# Patient Record
Sex: Female | Born: 1945 | Race: White | Hispanic: No | Smoking: Never smoker
Health system: Southern US, Community
[De-identification: ages and names within clinical notes are randomized; demographics above are authoritative.]

## PROBLEM LIST (undated history)

## (undated) DIAGNOSIS — I10 Essential (primary) hypertension: Secondary | ICD-10-CM

## (undated) DIAGNOSIS — Z808 Family history of malignant neoplasm of other organs or systems: Secondary | ICD-10-CM

## (undated) DIAGNOSIS — Z800 Family history of malignant neoplasm of digestive organs: Secondary | ICD-10-CM

## (undated) DIAGNOSIS — R011 Cardiac murmur, unspecified: Secondary | ICD-10-CM

## (undated) DIAGNOSIS — Z801 Family history of malignant neoplasm of trachea, bronchus and lung: Secondary | ICD-10-CM

## (undated) DIAGNOSIS — K219 Gastro-esophageal reflux disease without esophagitis: Secondary | ICD-10-CM

## (undated) DIAGNOSIS — E669 Obesity, unspecified: Secondary | ICD-10-CM

## (undated) DIAGNOSIS — H269 Unspecified cataract: Secondary | ICD-10-CM

## (undated) DIAGNOSIS — Z8042 Family history of malignant neoplasm of prostate: Secondary | ICD-10-CM

## (undated) DIAGNOSIS — R0600 Dyspnea, unspecified: Secondary | ICD-10-CM

## (undated) DIAGNOSIS — N8502 Endometrial intraepithelial neoplasia [EIN]: Secondary | ICD-10-CM

## (undated) DIAGNOSIS — Z923 Personal history of irradiation: Secondary | ICD-10-CM

## (undated) HISTORY — PX: TONSILLECTOMY: SUR1361

## (undated) HISTORY — PX: WISDOM TOOTH EXTRACTION: SHX21

## (undated) HISTORY — DX: Family history of malignant neoplasm of other organs or systems: Z80.8

## (undated) HISTORY — DX: Cardiac murmur, unspecified: R01.1

## (undated) HISTORY — PX: TOTAL HIP ARTHROPLASTY: SHX124

## (undated) HISTORY — DX: Family history of malignant neoplasm of digestive organs: Z80.0

## (undated) HISTORY — DX: Family history of malignant neoplasm of prostate: Z80.42

## (undated) HISTORY — DX: Family history of malignant neoplasm of trachea, bronchus and lung: Z80.1

## (undated) HISTORY — DX: Endometrial intraepithelial neoplasia (EIN): N85.02

## (undated) HISTORY — DX: Unspecified cataract: H26.9

## (undated) HISTORY — DX: Gastro-esophageal reflux disease without esophagitis: K21.9

## (undated) HISTORY — PX: ESOPHAGOGASTRODUODENOSCOPY: SHX1529

## (undated) HISTORY — DX: Obesity, unspecified: E66.9

## (undated) HISTORY — PX: BIOPSY ENDOMETRIAL: PRO11

---

## 1999-12-11 ENCOUNTER — Other Ambulatory Visit: Admission: RE | Admit: 1999-12-11 | Discharge: 1999-12-11 | Payer: Self-pay | Admitting: *Deleted

## 1999-12-25 ENCOUNTER — Ambulatory Visit (HOSPITAL_COMMUNITY): Admission: RE | Admit: 1999-12-25 | Discharge: 1999-12-25 | Payer: Self-pay | Admitting: General Practice

## 1999-12-25 ENCOUNTER — Encounter: Payer: Self-pay | Admitting: General Practice

## 2002-05-04 ENCOUNTER — Encounter: Payer: Self-pay | Admitting: Orthopedic Surgery

## 2002-05-11 ENCOUNTER — Inpatient Hospital Stay (HOSPITAL_COMMUNITY): Admission: RE | Admit: 2002-05-11 | Discharge: 2002-05-17 | Payer: Self-pay | Admitting: Orthopedic Surgery

## 2002-05-11 ENCOUNTER — Encounter: Payer: Self-pay | Admitting: Orthopedic Surgery

## 2005-02-21 ENCOUNTER — Ambulatory Visit: Payer: Self-pay | Admitting: Internal Medicine

## 2006-08-28 ENCOUNTER — Ambulatory Visit (HOSPITAL_COMMUNITY): Admission: RE | Admit: 2006-08-28 | Discharge: 2006-08-28 | Payer: Self-pay | Admitting: General Practice

## 2009-06-16 ENCOUNTER — Encounter: Admission: RE | Admit: 2009-06-16 | Discharge: 2009-06-16 | Payer: Self-pay | Admitting: Gastroenterology

## 2009-07-11 ENCOUNTER — Ambulatory Visit (HOSPITAL_COMMUNITY): Admission: RE | Admit: 2009-07-11 | Discharge: 2009-07-11 | Payer: Self-pay | Admitting: Gastroenterology

## 2010-05-23 ENCOUNTER — Other Ambulatory Visit: Payer: Self-pay | Admitting: Orthopedic Surgery

## 2010-05-23 DIAGNOSIS — S42209A Unspecified fracture of upper end of unspecified humerus, initial encounter for closed fracture: Secondary | ICD-10-CM

## 2010-05-23 DIAGNOSIS — W19XXXA Unspecified fall, initial encounter: Secondary | ICD-10-CM

## 2010-05-25 ENCOUNTER — Other Ambulatory Visit: Payer: Self-pay | Admitting: Orthopedic Surgery

## 2010-05-25 ENCOUNTER — Ambulatory Visit
Admission: RE | Admit: 2010-05-25 | Discharge: 2010-05-25 | Disposition: A | Discharge status: Home / Self Care | Payer: No Typology Code available for payment source | Source: Ambulatory Visit | Attending: Orthopedic Surgery | Admitting: Orthopedic Surgery

## 2010-05-25 ENCOUNTER — Other Ambulatory Visit: Payer: Self-pay

## 2010-05-25 DIAGNOSIS — S42209A Unspecified fracture of upper end of unspecified humerus, initial encounter for closed fracture: Secondary | ICD-10-CM

## 2010-05-25 DIAGNOSIS — W19XXXA Unspecified fall, initial encounter: Secondary | ICD-10-CM

## 2010-09-01 NOTE — H&P (Signed)
NAMEMOXIE, FORGACS                         ACCOUNT NO.:  [id number]   MEDICAL RECORD NO.:  [id number]                   PATIENT TYPE:  INP   LOCATION:  [id number]                                 FACILITY:  [facility]   PHYSICIAN:  Gaynelle Arabian, M.D.                 DATE OF BIRTH:  20-May-1945   DATE OF ADMISSION:  05/11/2002  DATE OF DISCHARGE:                                HISTORY & PHYSICAL   CHIEF COMPLAINT:  Left hip pain.   HISTORY OF PRESENT ILLNESS:  This is a 65 year old female who has been seen  in consultation by Dr. Wynelle Link for ongoing left hip pain.  The pain has been  ongoing for approximately one year now and has been somewhat progressive.  She had a fall dating back about a year ago.  She landed onto her left side.  Since that time she developed some groin pain and some lateral hip pain.  She was seen by Dr. Frederik Pear earlier in the year and was concerned about  the x-rays suggesting avascular necrosis.  She underwent subsequent MRI  which did confirm the diagnosis involving a large portion of the femoral  head.  She felt like she got a little bit better for awhile, but now the  pain has been much more progressive.  She was recommended by friends to come  over to be evaluated by Dr. Wynelle Link and she is seen.  Her x-rays and MRI are  reviewed.  There is appears to be involvement over 50% of the head and even  with more recent x-rays taken in the office, even shows a higher degree of  collapse since previous films.  Findings are discussed at length with the  patient.  It is felt she has reached a point due to the fact that the pain  has been much more progressive and she has been unable to continue to work  at the same capacity, that she could benefit from undergoing a hip  replacement.  Risks and benefits of the procedure have been discussed with  the patient and she has elected to proceed with surgery.   ALLERGIES:  No known drug allergies.   CURRENT MEDICATIONS:  1.  Avapro 25 mg daily.  2. She also takes Vioxx, stop prior to surgery.  3. Darvocet for pain.   PAST MEDICAL HISTORY:  Hypertension.   PAST SURGICAL HISTORY:  Tonsillectomy at age 59.   SOCIAL HISTORY:  She is divorced, nonsmoker, no alcohol.   FAMILY HISTORY:  Father deceased at age 85, mother living, age 16.  Family  history significant for lung cancer.   REVIEW OF SYSTEMS:  GENERAL:  No fevers, chills, night sweats.  NEUROLOGIC:  No seizures, syncope, paralysis.  RESPIRATORY:  No shortness of breath,  productive cough, or hemoptysis.  CARDIOVASCULAR:  No chest pain, angina, or  orthopnea.  GASTROINTESTINAL:  She does have some constipation which she  takes Fiber-Lax for.  No diarrhea, no nausea, vomiting, no bloody mucus in  the stool.  GENITOURINARY:  No dysuria, hemorrhage, or discharge.  MUSCULOSKELETAL:  The left hip as found in history of present illness.   PHYSICAL EXAMINATION:  VITAL SIGNS:  Pulse 78, respirations 16, blood  pressure 132/80.  GENERAL:  The patient is a 65 year old female, well-nourished, well-  developed, overweight.  No acute distress.  She is alert, oriented, and  cooperative.  She appears to be a good historian.  She is accompanied by her  son.  HEENT:  Normocephalic, atraumatic.  Pupils round and reactive.  She is noted  to wear glasses.  EOMs are intact.  Oropharynx clear.  NECK:  Supple.  No carotid bruits.  CHEST:  Clear to auscultation in anterior and posterior chest walls.  No  rhonchi or rales.  HEART:  Regular rate and rhythm, no murmur.  S1, S2 noted.  No rubs,  thrills, or palpitations.  ABDOMEN:  Soft, nontender, round, protuberant abdomen.  Bowel sounds are  present.  RECTAL/BREASTS/GENITALIA:  Not done, not pertinent to present illness.  EXTREMITIES:  __________ left lower extremity.  She has hip flexion of about  90 degrees.  No internal rotation, only about five degrees of external  rotation and only about 15 degrees of abduction.  She  does ambulate with an  antalgic gait.   IMPRESSION:  1. Avascular necrosis, left hip.  2. Hypertension.   PLAN:  The patient will be admitted to [facility] to undergo a  left total hip replacement arthroplasty.  Surgery will be performed by Dr.  Gaynelle Arabian.     Alexzandrew L. Dara Lords, P.A.              Gaynelle Arabian, M.D.    ALP/MEDQ  D:  05/13/2002  T:  05/13/2002  Job:  [id number]

## 2010-09-01 NOTE — Op Note (Signed)
NAMEANNIELEE, Heidi Avila                         ACCOUNT NO.:  [id number]   MEDICAL RECORD NO.:  [id number]                   PATIENT TYPE:  INP   LOCATION:  [id number]                                 FACILITY:  [facility]   PHYSICIAN:  Gaynelle Arabian, M.D.                 DATE OF BIRTH:  [birth year]   DATE OF PROCEDURE:  05/11/2002  DATE OF DISCHARGE:                                 OPERATIVE REPORT   PREOPERATIVE DIAGNOSIS:  Avascular necrosis, left hip.   POSTOPERATIVE DIAGNOSIS:  Avascular necrosis, left hip.   PROCEDURE:  Left total hip arthroplasty.   SURGEON:  Gaynelle Arabian, M.D.   ASSISTANT:  Alexzandrew L. Dara Lords, P.A.   ANESTHESIA:  General.   ESTIMATED BLOOD LOSS:  1500.   DRAINS:  Hemovac x1.   COMPLICATIONS:  None.   DISPOSITION:  Stable to recovery.   CLINICAL NOTE:  The patient is a 65 year old female who has high-grade  avascular necrosis of her left hip with pain refractory to nonoperative  management.  She presents now for a left total hip arthroplasty.   DESCRIPTION OF PROCEDURE:  After the successful administration of general  endotracheal anesthetic, the patient was placed in the right lateral  decubitus position with the left side up and held with the hip positioner.  The left lower extremity was isolated from her perineum with plastic drapes  and prepped and draped in the usual sterile fashion.  A standard  posterolateral incision was made with a 10 blade through subcutaneous tissue  to the level of the fascia lata, which was incised in line with the skin  incision.  The sciatic nerve was palpated and protected and short rotators  isolated off the femur.  Capsulectomy was performed and the hip dislocated.  The center of the femoral head was marked such and then a trial prosthesis  was placed such that the center of the trial head corresponds to the center  of her native femoral head.  Osteotomy line was marked on the femoral neck  and osteotomy made with an  oscillating saw.  The femur was then retracted  anteriorly.  Acetabular exposure was obtained and the labrum was removed.  We started reaming at a 47, coursing in increments of 2 to a 51.  It was  noted that her acetabular bone stock was rather soft.  She had a very thin  posterior wall.  We then attempted to impact a 52 mm Pinnacle acetabular  shell, and it was noted that we did not have good purchase of the shell.  We  attempted to place several screws, but again we did not get good purchase  with the screws.  We then utilized bone graft from the femoral head as well  as allograft to build up the medial wall and posterior wall.  We still did  not get good purchase and did not get good purchase with the screws,  either.  We then went to a 54 mm shell, a multi-hole cup, and again had the same  problem.  At this point we decided that we were not going to get good  purchase secondary to her bone stock, and thus we decided to proceed with a  cemented cup.  We thoroughly cleansed and dried the acetabular bed and then  mixed our batch of cement.  We then cemented a 52 mm Pinnacle liner in  anatomic position.  This was for a 32 head.  Once this was fully hardened,  we addressed the femur.  The canal finder was first used to open the canal,  then the canal was irrigated.  Axial reaming was performed to 15.5 mm, then  proximal reaming up to a 20D.  The sleeve was machined to a small and then a  20D small trial sleeve is placed with a 20 x 15 stem, 36 +8 neck.  Anteversion was neutral for her, and we went to approximately 20 degrees  anteversion.  We put a  32 +0 head first, then there was some soft tissue  laxity and we went to a +6 and had excellent soft tissue tension.  She was  able to fully extend the hip, then go to 70 degrees flexion and 40 degrees  Abduction at 70 internal rotation, then 90 degrees flexion and 70 degrees  internal rotation.  We then removed all the trials, put the permanent  20D  small sleeve and 20 x 15 stem with a 36 +8 neck.  Again we had the  anteversion about 20 degrees beyond neutral.  We put the permanent 32 +6  head, reduced the hip with the same stability parameters.  The wound was  then copiously irrigated with antibiotic solution, and short rotators  reattached to the femur through drill holes.  The fascia lata was closed  over a Hemovac drain with interrupted #1 Vicryl and subcu closed with #1 and  then 2-0 Vicryl,  subcuticular running 4-0 Monocryl.  The incision was  cleaned and dried, and Steri-Strips and a bulky sterile dressing applied.  She was subsequently awakened and transported to recovery in stable  condition.                                               Gaynelle Arabian, M.D.    FA/MEDQ  D:  05/11/2002  T:  05/11/2002  Job:  [id number]

## 2010-09-01 NOTE — Discharge Summary (Signed)
NAMEDIEDRE, HELLING                         ACCOUNT NO.:  [id number]   MEDICAL RECORD NO.:  [id number]                   PATIENT TYPE:  INP   LOCATION:  [id number]                                 FACILITY:  [facility]   PHYSICIAN:  Gaynelle Arabian, M.D.                 DATE OF BIRTH:  1945/11/13   DATE OF ADMISSION:  05/11/2002  DATE OF DISCHARGE:  05/17/2002                                 DISCHARGE SUMMARY   ADMISSION DIAGNOSES:  1. Avascular necrosis of left hip.  2. Hypertension.   DISCHARGE DIAGNOSES:  1. Avascular necrosis, left hip, status post left total hip replacement     arthroplasty.  2. Postoperative hemorrhagic anemia.  3. Status post transfusion without sequelae.  4. Hypertension.  5. Mild postoperative hyponatremia.   PROCEDURES:  The patient was taken to the operating room on May 11, 2002, and underwent a left total hip replacement arthroplasty.  Surgeon:  Gaynelle Arabian, M.D.  Assistant:  Alexzandrew L. Dara Lords, P.A.  Surgery under  general anesthesia.  Estimated blood loss 1500 cubic centimeters.  Hemovac  drain x 1.   CONSULTS:  Rehabilitation services, Meredith Staggers, M.D.   HISTORY OF PRESENT ILLNESS:  The patient is a 65 year old female seen by  Gaynelle Arabian, M.D., for ongoing left hip pain.  She was seen earlier by  Kathalene Frames. Mayer Camel, M.D., with x-rays suggesting avascular necrosis.  She  underwent an MRI which did confirm the diagnosis.  She was recommended to  follow up with Dr. Wynelle Link.  X-rays and MRI were reviewed.  She had greater  than 50% of the head involved with the AVN.  It was felt that she had  reached the point where she could benefit from undergoing surgery.  The  risks and benefits were discussed.  The patient was subsequently admitted to  the hospital.   LABORATORY DATA:  The CBC on admission showed a hemoglobin of 14.1,  hematocrit 41.1, white cell count 5.2, red blood cell count 4.71, and  differential with neutrophils 56, lymphs 29,  monos elevated at 12, eos 3,  and basos 1.  The postoperative H&H was 10.0 and 28.0.  This continued to  decline down to 8.2 and 23.8.  The patient was given two units of blood.  The post transfusion hemoglobin and hematocrit were back up to 9.7 and 27.9,  respectively.  The PT and PTT on admission were 12.9 and 30, respectively,  with an INR of 0.9.  Serial pro times were followed.  The last noted PT and  INR were 20.8 and 2.0, respectively.  The chemistry panel on admission was  all within normal limits.  Serial BMETs were followed.  The sodium did drop  from 140 down to 133 and back up to 136.  The glucose went up from 87 to 143  and back down 121.  The calcium dropped from 9.9 to 8.0 and  back up to 8.3.  The urinalysis on admission was negative.  Blood group type A+.   The EKG dated May 04, 2002, showed normal sinus rhythm, nonspecific T-  wave abnormalities, no old tracings to compare, and confirmed by Minette Brine.  Glade Lloyd, M.D.  The chest x-ray preoperatively was negative for active  disease.  Left hip films showed avascular necrosis noted on the left femoral  head without other significant findings.  Postoperative pelvis and hip films  showed left hip prosthesis well positioned.   HOSPITAL COURSE:  The patient was admitted to [facility] and was  taken to the OR.  She underwent the above-stated procedure without  complications.  The patient tolerated the procedure well and later returned  to the recovery room and then to the orthopedic floor for continued  postoperative care.  Vital signs were followed.  The patient was given 24  hours of postoperative IV antibiotics.  Placed on PCA analgesics for pain  control following surgery supplemented by p.o. medications.  She was placed  on Coumadin for DVT prophylaxis.  The Hemovac drain which had been placed at  the time of surgery was pulled.  It was actually left in for day #1 and not  pulled until postoperative day #2.  Labs  were followed.  She did have a drop  in hemoglobin which was noted down to 8.2.  On postoperative day #2, she was  given two units of blood.  The hemoglobin came back up to 9.7.  She  tolerated the blood well.  She was somewhat drowsy and sedate following  surgery.  She was weaned from the PCA medications over to p.o. medications.  She was noted to be slightly depressed on postoperative day #2.  Much  encouragement was provided and given to the patient.  By day #3, she was  feeling much better and much more awake.  She felt she had been oversedated  by p.o. and IV medications.  The PCA had been discontinued.  She was  encouraged on p.o. medications as needed.  She was slowly progressing with  physical therapy which was ordered postoperatively along with O2.  Therefore, a rehabilitation consult was called.  The patient was seen in  consultation by Meredith Staggers, M.D.  It was felt if the patient  continued to improve well with physical therapy that she may not require  inpatient stay and recommended home health.  By day #4, the patient was  doing much better.  There were no beds available on rehabilitation.  It did  not appear than any would open up any time soon.  Therefore, discharge  planning helped to arrange home health PT and home health nursing.  The  dressing was changed on postoperative day #2.  The incision was healing well  and we had a scant amount of drainage.  No signs of infection.  There was  still possibility that she may go to rehabilitation if a bed opened up,  however, no beds did become available.  It was later checked and the  insurance actually denied precertification for a rehabilitation stay.  Therefore, all efforts were made to help assist with home care.  She  continued to slowly progress.  By postoperative day #6, the date of May 17, 2002, the patient was doing much better.  She had been up working with therapy and up ambulating short distances with only  minimal assistance and  supervision.  She was tolerating her medications.  She was feeling better.  It was felt that the patient could be discharged home.   DISPOSITION:  The patient was discharged home on May 17, 2002.   DISCHARGE MEDICATIONS:  1. Vicodin for pain.  2. Robaxin for spasm.  3. Coumadin for DVT prophylaxis.  4. Continue home medications.   DIET:  As tolerated.   ACTIVITY:  She is 25-50% partial weightbearing to the left lower extremity.  Home health PT and home health nursing.  Total hip protocol.   FOLLOW-UP:  Follow up in two weeks from the date of surgery.  Call the  office for an appointment.   CONDITION ON DISCHARGE:  Improving.     Alexzandrew L. Dara Lords, P.A.              Gaynelle Arabian, M.D.    ALP/MEDQ  D:  06/10/2002  T:  06/10/2002  Job:  [id number]

## 2010-11-27 ENCOUNTER — Encounter: Payer: Self-pay | Admitting: Podiatry

## 2011-03-14 ENCOUNTER — Other Ambulatory Visit: Payer: Self-pay | Admitting: Family Medicine

## 2011-03-14 ENCOUNTER — Other Ambulatory Visit (HOSPITAL_COMMUNITY)
Admission: RE | Admit: 2011-03-14 | Discharge: 2011-03-14 | Disposition: A | Discharge status: Home / Self Care | Payer: BC Managed Care – PPO | Source: Ambulatory Visit | Attending: Family Medicine | Admitting: Family Medicine

## 2011-03-14 DIAGNOSIS — Z0000 Encounter for general adult medical examination without abnormal findings: Secondary | ICD-10-CM | POA: Insufficient documentation

## 2012-06-04 DIAGNOSIS — I10 Essential (primary) hypertension: Secondary | ICD-10-CM | POA: Diagnosis not present

## 2012-06-04 DIAGNOSIS — J189 Pneumonia, unspecified organism: Secondary | ICD-10-CM | POA: Diagnosis not present

## 2012-06-04 DIAGNOSIS — K219 Gastro-esophageal reflux disease without esophagitis: Secondary | ICD-10-CM | POA: Diagnosis not present

## 2012-12-19 ENCOUNTER — Other Ambulatory Visit (HOSPITAL_COMMUNITY): Payer: Self-pay | Admitting: Family Medicine

## 2012-12-19 DIAGNOSIS — Z1231 Encounter for screening mammogram for malignant neoplasm of breast: Secondary | ICD-10-CM

## 2012-12-25 ENCOUNTER — Ambulatory Visit (HOSPITAL_COMMUNITY)
Admission: RE | Admit: 2012-12-25 | Discharge: 2012-12-25 | Disposition: A | Discharge status: Home / Self Care | Payer: Medicare Other | Source: Ambulatory Visit | Attending: Family Medicine | Admitting: Family Medicine

## 2012-12-25 DIAGNOSIS — Z1231 Encounter for screening mammogram for malignant neoplasm of breast: Secondary | ICD-10-CM | POA: Insufficient documentation

## 2013-01-22 ENCOUNTER — Other Ambulatory Visit: Payer: Self-pay

## 2013-02-04 ENCOUNTER — Other Ambulatory Visit: Payer: Self-pay | Admitting: Dermatology

## 2013-04-29 DIAGNOSIS — D485 Neoplasm of uncertain behavior of skin: Secondary | ICD-10-CM | POA: Diagnosis not present

## 2013-05-21 DIAGNOSIS — D518 Other vitamin B12 deficiency anemias: Secondary | ICD-10-CM | POA: Diagnosis not present

## 2013-06-08 DIAGNOSIS — M810 Age-related osteoporosis without current pathological fracture: Secondary | ICD-10-CM | POA: Insufficient documentation

## 2013-06-08 DIAGNOSIS — M1990 Unspecified osteoarthritis, unspecified site: Secondary | ICD-10-CM | POA: Insufficient documentation

## 2013-06-08 DIAGNOSIS — R262 Difficulty in walking, not elsewhere classified: Secondary | ICD-10-CM | POA: Insufficient documentation

## 2013-06-08 DIAGNOSIS — G609 Hereditary and idiopathic neuropathy, unspecified: Secondary | ICD-10-CM | POA: Diagnosis not present

## 2013-06-10 DIAGNOSIS — D518 Other vitamin B12 deficiency anemias: Secondary | ICD-10-CM | POA: Diagnosis not present

## 2013-06-10 DIAGNOSIS — T169XXA Foreign body in ear, unspecified ear, initial encounter: Secondary | ICD-10-CM | POA: Diagnosis not present

## 2013-06-10 DIAGNOSIS — J40 Bronchitis, not specified as acute or chronic: Secondary | ICD-10-CM | POA: Diagnosis not present

## 2013-06-12 DIAGNOSIS — H66009 Acute suppurative otitis media without spontaneous rupture of ear drum, unspecified ear: Secondary | ICD-10-CM | POA: Diagnosis not present

## 2013-06-17 DIAGNOSIS — D235 Other benign neoplasm of skin of trunk: Secondary | ICD-10-CM | POA: Diagnosis not present

## 2013-06-17 DIAGNOSIS — Z85820 Personal history of malignant melanoma of skin: Secondary | ICD-10-CM | POA: Diagnosis not present

## 2013-06-17 DIAGNOSIS — L819 Disorder of pigmentation, unspecified: Secondary | ICD-10-CM | POA: Diagnosis not present

## 2013-06-25 DIAGNOSIS — R209 Unspecified disturbances of skin sensation: Secondary | ICD-10-CM | POA: Diagnosis not present

## 2013-06-25 DIAGNOSIS — R5383 Other fatigue: Secondary | ICD-10-CM | POA: Diagnosis not present

## 2013-06-25 DIAGNOSIS — E781 Pure hyperglyceridemia: Secondary | ICD-10-CM | POA: Diagnosis not present

## 2013-06-25 DIAGNOSIS — D518 Other vitamin B12 deficiency anemias: Secondary | ICD-10-CM | POA: Diagnosis not present

## 2013-06-25 DIAGNOSIS — E669 Obesity, unspecified: Secondary | ICD-10-CM | POA: Diagnosis not present

## 2013-06-25 DIAGNOSIS — H9209 Otalgia, unspecified ear: Secondary | ICD-10-CM | POA: Diagnosis not present

## 2013-06-25 DIAGNOSIS — G609 Hereditary and idiopathic neuropathy, unspecified: Secondary | ICD-10-CM | POA: Diagnosis not present

## 2013-07-09 DIAGNOSIS — H66009 Acute suppurative otitis media without spontaneous rupture of ear drum, unspecified ear: Secondary | ICD-10-CM | POA: Diagnosis not present

## 2014-01-07 DIAGNOSIS — J069 Acute upper respiratory infection, unspecified: Secondary | ICD-10-CM | POA: Diagnosis not present

## 2014-02-24 DIAGNOSIS — R252 Cramp and spasm: Secondary | ICD-10-CM | POA: Diagnosis not present

## 2014-02-24 DIAGNOSIS — G629 Polyneuropathy, unspecified: Secondary | ICD-10-CM | POA: Diagnosis not present

## 2014-02-24 DIAGNOSIS — M79601 Pain in right arm: Secondary | ICD-10-CM | POA: Diagnosis not present

## 2014-02-24 DIAGNOSIS — M79604 Pain in right leg: Secondary | ICD-10-CM | POA: Diagnosis not present

## 2014-04-05 DIAGNOSIS — Z0000 Encounter for general adult medical examination without abnormal findings: Secondary | ICD-10-CM | POA: Diagnosis not present

## 2014-04-05 DIAGNOSIS — Z23 Encounter for immunization: Secondary | ICD-10-CM | POA: Diagnosis not present

## 2014-04-15 ENCOUNTER — Other Ambulatory Visit: Payer: Self-pay | Admitting: Physician Assistant

## 2014-04-15 DIAGNOSIS — D225 Melanocytic nevi of trunk: Secondary | ICD-10-CM | POA: Diagnosis not present

## 2014-04-15 DIAGNOSIS — L814 Other melanin hyperpigmentation: Secondary | ICD-10-CM | POA: Diagnosis not present

## 2014-04-15 DIAGNOSIS — Z85820 Personal history of malignant melanoma of skin: Secondary | ICD-10-CM | POA: Diagnosis not present

## 2014-04-15 DIAGNOSIS — Z08 Encounter for follow-up examination after completed treatment for malignant neoplasm: Secondary | ICD-10-CM | POA: Diagnosis not present

## 2014-04-15 DIAGNOSIS — L820 Inflamed seborrheic keratosis: Secondary | ICD-10-CM | POA: Diagnosis not present

## 2014-04-15 DIAGNOSIS — D485 Neoplasm of uncertain behavior of skin: Secondary | ICD-10-CM | POA: Diagnosis not present

## 2014-04-21 DIAGNOSIS — Z13820 Encounter for screening for osteoporosis: Secondary | ICD-10-CM | POA: Diagnosis not present

## 2014-04-21 DIAGNOSIS — M8580 Other specified disorders of bone density and structure, unspecified site: Secondary | ICD-10-CM | POA: Diagnosis not present

## 2014-04-21 DIAGNOSIS — M8589 Other specified disorders of bone density and structure, multiple sites: Secondary | ICD-10-CM | POA: Diagnosis not present

## 2014-08-09 DIAGNOSIS — Z124 Encounter for screening for malignant neoplasm of cervix: Secondary | ICD-10-CM | POA: Diagnosis not present

## 2014-08-09 DIAGNOSIS — Z01419 Encounter for gynecological examination (general) (routine) without abnormal findings: Secondary | ICD-10-CM | POA: Diagnosis not present

## 2014-10-14 ENCOUNTER — Other Ambulatory Visit: Payer: Self-pay | Admitting: Physician Assistant

## 2014-10-14 DIAGNOSIS — L821 Other seborrheic keratosis: Secondary | ICD-10-CM | POA: Diagnosis not present

## 2014-10-14 DIAGNOSIS — L820 Inflamed seborrheic keratosis: Secondary | ICD-10-CM | POA: Diagnosis not present

## 2014-10-14 DIAGNOSIS — D225 Melanocytic nevi of trunk: Secondary | ICD-10-CM | POA: Diagnosis not present

## 2014-10-14 DIAGNOSIS — Z85820 Personal history of malignant melanoma of skin: Secondary | ICD-10-CM | POA: Diagnosis not present

## 2014-10-14 DIAGNOSIS — Z08 Encounter for follow-up examination after completed treatment for malignant neoplasm: Secondary | ICD-10-CM | POA: Diagnosis not present

## 2014-10-14 DIAGNOSIS — L814 Other melanin hyperpigmentation: Secondary | ICD-10-CM | POA: Diagnosis not present

## 2015-02-07 DIAGNOSIS — R252 Cramp and spasm: Secondary | ICD-10-CM | POA: Diagnosis not present

## 2015-02-07 DIAGNOSIS — G629 Polyneuropathy, unspecified: Secondary | ICD-10-CM | POA: Diagnosis not present

## 2015-03-24 DIAGNOSIS — Z23 Encounter for immunization: Secondary | ICD-10-CM | POA: Diagnosis not present

## 2015-04-14 DIAGNOSIS — L570 Actinic keratosis: Secondary | ICD-10-CM | POA: Diagnosis not present

## 2015-04-14 DIAGNOSIS — D225 Melanocytic nevi of trunk: Secondary | ICD-10-CM | POA: Diagnosis not present

## 2015-04-14 DIAGNOSIS — Z85820 Personal history of malignant melanoma of skin: Secondary | ICD-10-CM | POA: Diagnosis not present

## 2015-04-14 DIAGNOSIS — L821 Other seborrheic keratosis: Secondary | ICD-10-CM | POA: Diagnosis not present

## 2015-04-14 DIAGNOSIS — L812 Freckles: Secondary | ICD-10-CM | POA: Diagnosis not present

## 2015-06-27 ENCOUNTER — Other Ambulatory Visit: Payer: Self-pay | Admitting: Gastroenterology

## 2015-06-27 DIAGNOSIS — K621 Rectal polyp: Secondary | ICD-10-CM | POA: Diagnosis not present

## 2015-06-27 DIAGNOSIS — D126 Benign neoplasm of colon, unspecified: Secondary | ICD-10-CM | POA: Diagnosis not present

## 2015-06-27 DIAGNOSIS — Z1211 Encounter for screening for malignant neoplasm of colon: Secondary | ICD-10-CM | POA: Diagnosis not present

## 2015-06-27 DIAGNOSIS — D128 Benign neoplasm of rectum: Secondary | ICD-10-CM | POA: Diagnosis not present

## 2015-06-27 DIAGNOSIS — K5730 Diverticulosis of large intestine without perforation or abscess without bleeding: Secondary | ICD-10-CM | POA: Diagnosis not present

## 2015-06-27 DIAGNOSIS — Z800 Family history of malignant neoplasm of digestive organs: Secondary | ICD-10-CM | POA: Diagnosis not present

## 2015-09-18 DIAGNOSIS — Z0000 Encounter for general adult medical examination without abnormal findings: Secondary | ICD-10-CM | POA: Diagnosis not present

## 2015-09-18 DIAGNOSIS — I10 Essential (primary) hypertension: Secondary | ICD-10-CM | POA: Diagnosis not present

## 2015-09-18 DIAGNOSIS — R5383 Other fatigue: Secondary | ICD-10-CM | POA: Diagnosis not present

## 2015-09-18 DIAGNOSIS — E559 Vitamin D deficiency, unspecified: Secondary | ICD-10-CM | POA: Diagnosis not present

## 2015-09-18 DIAGNOSIS — R0602 Shortness of breath: Secondary | ICD-10-CM | POA: Diagnosis not present

## 2015-10-19 DIAGNOSIS — R262 Difficulty in walking, not elsewhere classified: Secondary | ICD-10-CM | POA: Diagnosis not present

## 2015-10-19 DIAGNOSIS — M170 Bilateral primary osteoarthritis of knee: Secondary | ICD-10-CM | POA: Diagnosis not present

## 2015-10-19 DIAGNOSIS — M25561 Pain in right knee: Secondary | ICD-10-CM | POA: Diagnosis not present

## 2015-10-19 DIAGNOSIS — M25562 Pain in left knee: Secondary | ICD-10-CM | POA: Diagnosis not present

## 2015-10-20 DIAGNOSIS — M170 Bilateral primary osteoarthritis of knee: Secondary | ICD-10-CM | POA: Diagnosis not present

## 2015-10-24 DIAGNOSIS — M25561 Pain in right knee: Secondary | ICD-10-CM | POA: Diagnosis not present

## 2015-10-24 DIAGNOSIS — M1711 Unilateral primary osteoarthritis, right knee: Secondary | ICD-10-CM | POA: Diagnosis not present

## 2015-10-27 DIAGNOSIS — M25562 Pain in left knee: Secondary | ICD-10-CM | POA: Diagnosis not present

## 2015-10-27 DIAGNOSIS — M1712 Unilateral primary osteoarthritis, left knee: Secondary | ICD-10-CM | POA: Diagnosis not present

## 2015-10-31 DIAGNOSIS — M1711 Unilateral primary osteoarthritis, right knee: Secondary | ICD-10-CM | POA: Diagnosis not present

## 2015-10-31 DIAGNOSIS — M25561 Pain in right knee: Secondary | ICD-10-CM | POA: Diagnosis not present

## 2015-11-03 DIAGNOSIS — M25562 Pain in left knee: Secondary | ICD-10-CM | POA: Diagnosis not present

## 2015-11-03 DIAGNOSIS — M1712 Unilateral primary osteoarthritis, left knee: Secondary | ICD-10-CM | POA: Diagnosis not present

## 2015-11-10 DIAGNOSIS — M25561 Pain in right knee: Secondary | ICD-10-CM | POA: Diagnosis not present

## 2015-11-10 DIAGNOSIS — M1711 Unilateral primary osteoarthritis, right knee: Secondary | ICD-10-CM | POA: Diagnosis not present

## 2015-11-13 DIAGNOSIS — G8929 Other chronic pain: Secondary | ICD-10-CM | POA: Diagnosis not present

## 2015-11-13 DIAGNOSIS — M545 Low back pain: Secondary | ICD-10-CM | POA: Diagnosis not present

## 2015-11-13 DIAGNOSIS — J441 Chronic obstructive pulmonary disease with (acute) exacerbation: Secondary | ICD-10-CM | POA: Diagnosis not present

## 2015-11-13 DIAGNOSIS — J45901 Unspecified asthma with (acute) exacerbation: Secondary | ICD-10-CM | POA: Diagnosis not present

## 2015-11-16 DIAGNOSIS — M25562 Pain in left knee: Secondary | ICD-10-CM | POA: Diagnosis not present

## 2015-11-16 DIAGNOSIS — M1712 Unilateral primary osteoarthritis, left knee: Secondary | ICD-10-CM | POA: Diagnosis not present

## 2015-11-23 DIAGNOSIS — M25561 Pain in right knee: Secondary | ICD-10-CM | POA: Diagnosis not present

## 2015-11-23 DIAGNOSIS — M170 Bilateral primary osteoarthritis of knee: Secondary | ICD-10-CM | POA: Diagnosis not present

## 2015-11-23 DIAGNOSIS — M25562 Pain in left knee: Secondary | ICD-10-CM | POA: Diagnosis not present

## 2016-01-12 ENCOUNTER — Other Ambulatory Visit: Payer: Self-pay | Admitting: General Practice

## 2016-01-12 DIAGNOSIS — Z1231 Encounter for screening mammogram for malignant neoplasm of breast: Secondary | ICD-10-CM

## 2016-01-19 ENCOUNTER — Ambulatory Visit
Admission: RE | Admit: 2016-01-19 | Discharge: 2016-01-19 | Disposition: A | Discharge status: Home / Self Care | Payer: Medicare Other | Source: Ambulatory Visit | Attending: General Practice | Admitting: General Practice

## 2016-01-19 DIAGNOSIS — Z1231 Encounter for screening mammogram for malignant neoplasm of breast: Secondary | ICD-10-CM | POA: Diagnosis not present

## 2016-01-26 DIAGNOSIS — Z85820 Personal history of malignant melanoma of skin: Secondary | ICD-10-CM | POA: Diagnosis not present

## 2016-01-26 DIAGNOSIS — L814 Other melanin hyperpigmentation: Secondary | ICD-10-CM | POA: Diagnosis not present

## 2016-01-26 DIAGNOSIS — L821 Other seborrheic keratosis: Secondary | ICD-10-CM | POA: Diagnosis not present

## 2016-01-26 DIAGNOSIS — D235 Other benign neoplasm of skin of trunk: Secondary | ICD-10-CM | POA: Diagnosis not present

## 2016-01-26 DIAGNOSIS — D1801 Hemangioma of skin and subcutaneous tissue: Secondary | ICD-10-CM | POA: Diagnosis not present

## 2016-01-26 DIAGNOSIS — B353 Tinea pedis: Secondary | ICD-10-CM | POA: Diagnosis not present

## 2016-01-26 DIAGNOSIS — L309 Dermatitis, unspecified: Secondary | ICD-10-CM | POA: Diagnosis not present

## 2016-02-27 DIAGNOSIS — M25561 Pain in right knee: Secondary | ICD-10-CM | POA: Diagnosis not present

## 2016-02-27 DIAGNOSIS — M170 Bilateral primary osteoarthritis of knee: Secondary | ICD-10-CM | POA: Diagnosis not present

## 2016-02-27 DIAGNOSIS — M25562 Pain in left knee: Secondary | ICD-10-CM | POA: Diagnosis not present

## 2016-02-29 DIAGNOSIS — M25561 Pain in right knee: Secondary | ICD-10-CM | POA: Diagnosis not present

## 2016-04-16 DIAGNOSIS — J189 Pneumonia, unspecified organism: Secondary | ICD-10-CM

## 2016-04-16 HISTORY — DX: Pneumonia, unspecified organism: J18.9

## 2016-04-30 ENCOUNTER — Emergency Department (HOSPITAL_BASED_OUTPATIENT_CLINIC_OR_DEPARTMENT_OTHER)
Admission: EM | Admit: 2016-04-30 | Discharge: 2016-04-30 | Disposition: A | Discharge status: Home / Self Care | Payer: Medicare Other | Attending: Emergency Medicine | Admitting: Emergency Medicine

## 2016-04-30 ENCOUNTER — Encounter (HOSPITAL_BASED_OUTPATIENT_CLINIC_OR_DEPARTMENT_OTHER): Payer: Self-pay | Admitting: *Deleted

## 2016-04-30 DIAGNOSIS — R05 Cough: Secondary | ICD-10-CM | POA: Diagnosis not present

## 2016-04-30 DIAGNOSIS — J189 Pneumonia, unspecified organism: Secondary | ICD-10-CM | POA: Diagnosis not present

## 2016-04-30 DIAGNOSIS — I10 Essential (primary) hypertension: Secondary | ICD-10-CM | POA: Diagnosis not present

## 2016-04-30 DIAGNOSIS — J181 Lobar pneumonia, unspecified organism: Secondary | ICD-10-CM | POA: Diagnosis not present

## 2016-04-30 DIAGNOSIS — R0600 Dyspnea, unspecified: Secondary | ICD-10-CM | POA: Diagnosis not present

## 2016-04-30 DIAGNOSIS — Z79899 Other long term (current) drug therapy: Secondary | ICD-10-CM | POA: Diagnosis not present

## 2016-04-30 HISTORY — DX: Essential (primary) hypertension: I10

## 2016-04-30 MED ORDER — LEVOFLOXACIN 750 MG PO TABS
750.0000 mg | ORAL_TABLET | Freq: Once | ORAL | Status: AC
  Administered 2016-04-30: 750 mg via ORAL
  Filled 2016-04-30: qty 1

## 2016-04-30 MED ORDER — LEVOFLOXACIN 750 MG PO TABS: 750.0000 mg | ORAL_TABLET | Freq: Every day | ORAL | 0 refills | Status: AC

## 2016-04-30 MED ORDER — ALBUTEROL SULFATE HFA 108 (90 BASE) MCG/ACT IN AERS
2.0000 | INHALATION_SPRAY | Freq: Once | RESPIRATORY_TRACT | Status: AC
  Administered 2016-04-30: 2 via RESPIRATORY_TRACT
  Filled 2016-04-30: qty 6.7

## 2016-04-30 NOTE — ED Triage Notes (Signed)
LLL pneumonia diagnosed at [facility] tonight. Negative flu test. She was told to come here for further evaluation. Given Motrin before leaving.

## 2016-04-30 NOTE — ED Provider Notes (Signed)
[place] DEPT [facility] Provider Note   CSN: [id number] Arrival date & time: 04/30/16  1847  By signing my name below, I, Arianna Nassar, attest that this documentation has been prepared under the direction and in the presence of Fatima Blank, MD.  Electronically Signed: Julien Nordmann, ED Scribe. 04/30/16. 10:02 PM.    History   Chief Complaint Chief Complaint  Patient presents with  . Cough  . Sore Throat   The history is provided by the patient. No language interpreter was used.   HPI Comments: Heidi Avila is a 71 y.o. female who presents to the Emergency Department complaining of gradual worsening, productive cough that brings up yellow sputum x 1 day. She expresses fever (tmax 102), rhinorrhea, congestion, and shortness of breath. Pt was seen at [facility] this evening where she had a CXR performed and was diagnosed with left lower lobe pneumonia. She received two breathing treatments due to excessive wheezing but did not receive any antibiotics. She was also given motrin before coming to the ED which helped alleviate her fever. Pt was told to come to the ED for further evaluation. She has not been formally diagnosed with asthma but expresses that she works in a [employer] resources [employer] and is constantly surrounded by chemical sprays. She denies chest pain, abdominal pain, nausea, and diarrhea. Pt is a non-smoker.  Past Medical History:  Diagnosis Date  . Hypertension     There are no active problems to display for this patient.   Past Surgical History:  Procedure Laterality Date  . TOTAL HIP ARTHROPLASTY      OB History    No data available       Home Medications    Prior to Admission medications   Medication Sig Start Date End Date Taking? Authorizing Provider  BENAZEPRIL HCL PO Take by mouth.   Yes Historical Provider, MD  GABAPENTIN PO Take by mouth.   Yes Historical Provider, MD  TIZANIDINE HCL PO Take by mouth.   Yes Historical Provider, MD  levofloxacin (LEVAQUIN)  750 MG tablet Take 1 tablet (750 mg total) by mouth daily. 05/01/16 05/05/16  Fatima Blank, MD    Family History No family history on file.  Social History Social History  Substance Use Topics  . Smoking status: Never Smoker  . Smokeless tobacco: Never Used  . Alcohol use No     Allergies   Patient has no known allergies.   Review of Systems Review of Systems  A complete 10 system review of systems was obtained and all systems are negative except as noted in the HPI and PMH.   Physical Exam Updated Vital Signs Initial vitals: BP 147/69 (BP Location: Right Arm)   Pulse 76   Temp 98.7 F (37.1 C) (Oral)   Resp 18   Ht 5\' 3"  (1.6 m)   Wt 250 lb (113.4 kg)   SpO2 97%   BMI 44.29 kg/m   Physical Exam  Constitutional: She is oriented to person, place, and time. She appears well-developed and well-nourished. No distress.  HENT:  Head: Normocephalic and atraumatic.  Nose: Nose normal.  Eyes: Conjunctivae and EOM are normal. Pupils are equal, round, and reactive to light. Right eye exhibits no discharge. Left eye exhibits no discharge. No scleral icterus.  Neck: Normal range of motion. Neck supple.  Cardiovascular: Normal rate and regular rhythm.  Exam reveals no gallop and no friction rub.   No murmur heard. Pulmonary/Chest: Effort normal. No stridor. No respiratory distress. She  has wheezes (throughout). She has rhonchi in the left lower field. She has rales in the left lower field.  Abdominal: Soft. She exhibits no distension. There is no tenderness.  Musculoskeletal: She exhibits no edema or tenderness.  Neurological: She is alert and oriented to person, place, and time.  Skin: Skin is warm and dry. No rash noted. She is not diaphoretic. No erythema.  Psychiatric: She has a normal mood and affect.  Vitals reviewed.    ED Treatments / Results  DIAGNOSTIC STUDIES: Oxygen Saturation is 97% on RA, normal by my interpretation.  COORDINATION OF CARE:  9:58 PM  Discussed treatment plan with pt at bedside and pt agreed to plan.  Labs (all labs ordered are listed, but only abnormal results are displayed) Labs Reviewed - No data to display  EKG  EKG Interpretation None       Radiology No results found.  Procedures Procedures (including critical care time)  Medications Ordered in ED Medications  albuterol (PROVENTIL HFA;VENTOLIN HFA) 108 (90 Base) MCG/ACT inhaler 2 puff (not administered)  levofloxacin (LEVAQUIN) tablet 750 mg (not administered)     Initial Impression / Assessment and Plan / ED Course  I have reviewed the triage vital signs and the nursing notes.  Pertinent labs & imaging results that were available during my care of the patient were reviewed by me and considered in my medical decision making (see chart for details).  Clinical Course     Presentation consistent with likely left lower lobe pneumonia which was identified on chest x-ray at urgent care. Patient is satting well on room air. Mild wheezing and provided with albuterol inhaler. We will treat with Levaquin for community-acquired pneumonia.  The patient is safe for discharge with strict return precautions.   Final Clinical Impressions(s) / ED Diagnoses   Final diagnoses:  Community acquired pneumonia of left lower lobe of lung ([place])   Disposition: Discharge  Condition: Good  I have discussed the results, Dx and Tx plan with the patient who expressed understanding and agree(s) with the plan. Discharge instructions discussed at great length. The patient was given strict return precautions who verbalized understanding of the instructions. No further questions at time of discharge.    New Prescriptions   LEVOFLOXACIN (LEVAQUIN) 750 MG TABLET    Take 1 tablet (750 mg total) by mouth daily.    Follow Up: Primary care provider  Schedule an appointment as soon as possible for a visit  in 3-5 days, If symptoms do not improve or  worsen  I personally  performed the services described in this documentation, which was scribed in my presence. The recorded information has been reviewed and is accurate.        Fatima Blank, MD 04/30/16 2224

## 2016-06-11 DIAGNOSIS — M170 Bilateral primary osteoarthritis of knee: Secondary | ICD-10-CM | POA: Diagnosis not present

## 2016-06-11 DIAGNOSIS — M25561 Pain in right knee: Secondary | ICD-10-CM | POA: Diagnosis not present

## 2016-06-11 DIAGNOSIS — M25562 Pain in left knee: Secondary | ICD-10-CM | POA: Diagnosis not present

## 2016-06-11 DIAGNOSIS — M25461 Effusion, right knee: Secondary | ICD-10-CM | POA: Diagnosis not present

## 2016-06-11 DIAGNOSIS — M25462 Effusion, left knee: Secondary | ICD-10-CM | POA: Diagnosis not present

## 2016-06-18 DIAGNOSIS — M1711 Unilateral primary osteoarthritis, right knee: Secondary | ICD-10-CM | POA: Diagnosis not present

## 2016-06-18 DIAGNOSIS — M25561 Pain in right knee: Secondary | ICD-10-CM | POA: Diagnosis not present

## 2016-06-21 DIAGNOSIS — M1712 Unilateral primary osteoarthritis, left knee: Secondary | ICD-10-CM | POA: Diagnosis not present

## 2016-06-21 DIAGNOSIS — M25562 Pain in left knee: Secondary | ICD-10-CM | POA: Diagnosis not present

## 2016-06-27 DIAGNOSIS — M25562 Pain in left knee: Secondary | ICD-10-CM | POA: Diagnosis not present

## 2016-06-27 DIAGNOSIS — M25561 Pain in right knee: Secondary | ICD-10-CM | POA: Diagnosis not present

## 2016-06-27 DIAGNOSIS — M170 Bilateral primary osteoarthritis of knee: Secondary | ICD-10-CM | POA: Diagnosis not present

## 2016-07-26 DIAGNOSIS — L570 Actinic keratosis: Secondary | ICD-10-CM | POA: Diagnosis not present

## 2016-07-26 DIAGNOSIS — Z85820 Personal history of malignant melanoma of skin: Secondary | ICD-10-CM | POA: Diagnosis not present

## 2016-07-26 DIAGNOSIS — L820 Inflamed seborrheic keratosis: Secondary | ICD-10-CM | POA: Diagnosis not present

## 2016-07-26 DIAGNOSIS — L821 Other seborrheic keratosis: Secondary | ICD-10-CM | POA: Diagnosis not present

## 2016-07-26 DIAGNOSIS — D1801 Hemangioma of skin and subcutaneous tissue: Secondary | ICD-10-CM | POA: Diagnosis not present

## 2016-07-26 DIAGNOSIS — D225 Melanocytic nevi of trunk: Secondary | ICD-10-CM | POA: Diagnosis not present

## 2016-07-26 DIAGNOSIS — L814 Other melanin hyperpigmentation: Secondary | ICD-10-CM | POA: Diagnosis not present

## 2016-07-31 DIAGNOSIS — M25551 Pain in right hip: Secondary | ICD-10-CM | POA: Diagnosis not present

## 2016-07-31 DIAGNOSIS — G609 Hereditary and idiopathic neuropathy, unspecified: Secondary | ICD-10-CM | POA: Diagnosis not present

## 2016-07-31 DIAGNOSIS — M25552 Pain in left hip: Secondary | ICD-10-CM | POA: Diagnosis not present

## 2016-07-31 DIAGNOSIS — I10 Essential (primary) hypertension: Secondary | ICD-10-CM | POA: Diagnosis not present

## 2016-09-06 DIAGNOSIS — E559 Vitamin D deficiency, unspecified: Secondary | ICD-10-CM | POA: Diagnosis not present

## 2016-09-06 DIAGNOSIS — I10 Essential (primary) hypertension: Secondary | ICD-10-CM | POA: Diagnosis not present

## 2016-09-06 DIAGNOSIS — Z1389 Encounter for screening for other disorder: Secondary | ICD-10-CM | POA: Diagnosis not present

## 2016-09-06 DIAGNOSIS — Z760 Encounter for issue of repeat prescription: Secondary | ICD-10-CM | POA: Diagnosis not present

## 2016-09-06 DIAGNOSIS — E7800 Pure hypercholesterolemia, unspecified: Secondary | ICD-10-CM | POA: Diagnosis not present

## 2016-09-06 DIAGNOSIS — Z79899 Other long term (current) drug therapy: Secondary | ICD-10-CM | POA: Diagnosis not present

## 2016-09-06 DIAGNOSIS — R5383 Other fatigue: Secondary | ICD-10-CM | POA: Diagnosis not present

## 2016-09-06 DIAGNOSIS — J189 Pneumonia, unspecified organism: Secondary | ICD-10-CM | POA: Diagnosis not present

## 2016-09-06 DIAGNOSIS — J209 Acute bronchitis, unspecified: Secondary | ICD-10-CM | POA: Diagnosis not present

## 2016-10-11 DIAGNOSIS — M25562 Pain in left knee: Secondary | ICD-10-CM | POA: Diagnosis not present

## 2016-10-11 DIAGNOSIS — M25561 Pain in right knee: Secondary | ICD-10-CM | POA: Diagnosis not present

## 2016-10-11 DIAGNOSIS — M170 Bilateral primary osteoarthritis of knee: Secondary | ICD-10-CM | POA: Diagnosis not present

## 2016-10-18 DIAGNOSIS — M1711 Unilateral primary osteoarthritis, right knee: Secondary | ICD-10-CM | POA: Diagnosis not present

## 2016-10-18 DIAGNOSIS — M25561 Pain in right knee: Secondary | ICD-10-CM | POA: Diagnosis not present

## 2016-10-25 DIAGNOSIS — M1712 Unilateral primary osteoarthritis, left knee: Secondary | ICD-10-CM | POA: Diagnosis not present

## 2016-10-25 DIAGNOSIS — M25562 Pain in left knee: Secondary | ICD-10-CM | POA: Diagnosis not present

## 2016-11-01 DIAGNOSIS — M25561 Pain in right knee: Secondary | ICD-10-CM | POA: Diagnosis not present

## 2016-11-01 DIAGNOSIS — M1711 Unilateral primary osteoarthritis, right knee: Secondary | ICD-10-CM | POA: Diagnosis not present

## 2016-11-08 DIAGNOSIS — M1712 Unilateral primary osteoarthritis, left knee: Secondary | ICD-10-CM | POA: Diagnosis not present

## 2016-11-08 DIAGNOSIS — M25562 Pain in left knee: Secondary | ICD-10-CM | POA: Diagnosis not present

## 2016-11-15 DIAGNOSIS — M25561 Pain in right knee: Secondary | ICD-10-CM | POA: Diagnosis not present

## 2016-11-15 DIAGNOSIS — M25562 Pain in left knee: Secondary | ICD-10-CM | POA: Diagnosis not present

## 2016-11-15 DIAGNOSIS — M170 Bilateral primary osteoarthritis of knee: Secondary | ICD-10-CM | POA: Diagnosis not present

## 2017-01-23 DIAGNOSIS — M1711 Unilateral primary osteoarthritis, right knee: Secondary | ICD-10-CM | POA: Diagnosis not present

## 2017-02-14 DIAGNOSIS — Z23 Encounter for immunization: Secondary | ICD-10-CM | POA: Diagnosis not present

## 2017-02-25 DIAGNOSIS — Z23 Encounter for immunization: Secondary | ICD-10-CM | POA: Diagnosis not present

## 2017-03-13 DIAGNOSIS — G629 Polyneuropathy, unspecified: Secondary | ICD-10-CM | POA: Diagnosis not present

## 2017-03-13 DIAGNOSIS — G5600 Carpal tunnel syndrome, unspecified upper limb: Secondary | ICD-10-CM | POA: Diagnosis not present

## 2017-03-17 DIAGNOSIS — G629 Polyneuropathy, unspecified: Secondary | ICD-10-CM | POA: Insufficient documentation

## 2017-03-17 DIAGNOSIS — G5603 Carpal tunnel syndrome, bilateral upper limbs: Secondary | ICD-10-CM | POA: Insufficient documentation

## 2017-05-19 DIAGNOSIS — J209 Acute bronchitis, unspecified: Secondary | ICD-10-CM | POA: Diagnosis not present

## 2017-05-19 DIAGNOSIS — R05 Cough: Secondary | ICD-10-CM | POA: Diagnosis not present

## 2017-05-19 DIAGNOSIS — R0602 Shortness of breath: Secondary | ICD-10-CM | POA: Diagnosis not present

## 2017-05-29 DIAGNOSIS — R05 Cough: Secondary | ICD-10-CM | POA: Diagnosis not present

## 2017-05-29 DIAGNOSIS — H68103 Unspecified obstruction of Eustachian tube, bilateral: Secondary | ICD-10-CM | POA: Diagnosis not present

## 2017-05-29 DIAGNOSIS — J45909 Unspecified asthma, uncomplicated: Secondary | ICD-10-CM | POA: Diagnosis not present

## 2017-05-29 DIAGNOSIS — J988 Other specified respiratory disorders: Secondary | ICD-10-CM | POA: Diagnosis not present

## 2017-06-13 DIAGNOSIS — M179 Osteoarthritis of knee, unspecified: Secondary | ICD-10-CM | POA: Insufficient documentation

## 2017-06-13 DIAGNOSIS — M170 Bilateral primary osteoarthritis of knee: Secondary | ICD-10-CM | POA: Diagnosis not present

## 2017-06-19 DIAGNOSIS — L918 Other hypertrophic disorders of the skin: Secondary | ICD-10-CM | POA: Diagnosis not present

## 2017-06-19 DIAGNOSIS — L820 Inflamed seborrheic keratosis: Secondary | ICD-10-CM | POA: Diagnosis not present

## 2017-06-19 DIAGNOSIS — Z85820 Personal history of malignant melanoma of skin: Secondary | ICD-10-CM | POA: Diagnosis not present

## 2017-06-19 DIAGNOSIS — L570 Actinic keratosis: Secondary | ICD-10-CM | POA: Diagnosis not present

## 2017-06-19 DIAGNOSIS — L821 Other seborrheic keratosis: Secondary | ICD-10-CM | POA: Diagnosis not present

## 2017-06-28 DIAGNOSIS — K5909 Other constipation: Secondary | ICD-10-CM | POA: Diagnosis not present

## 2017-07-03 ENCOUNTER — Ambulatory Visit
Admission: RE | Admit: 2017-07-03 | Discharge: 2017-07-03 | Disposition: A | Discharge status: Home / Self Care | Payer: Medicare Other | Source: Ambulatory Visit | Attending: Physician Assistant | Admitting: Physician Assistant

## 2017-07-03 ENCOUNTER — Other Ambulatory Visit: Payer: Self-pay | Admitting: Physician Assistant

## 2017-07-03 DIAGNOSIS — K5900 Constipation, unspecified: Secondary | ICD-10-CM | POA: Diagnosis not present

## 2017-07-03 DIAGNOSIS — K5909 Other constipation: Secondary | ICD-10-CM | POA: Diagnosis not present

## 2017-07-03 DIAGNOSIS — R109 Unspecified abdominal pain: Secondary | ICD-10-CM | POA: Diagnosis not present

## 2017-09-26 DIAGNOSIS — M170 Bilateral primary osteoarthritis of knee: Secondary | ICD-10-CM | POA: Diagnosis not present

## 2017-11-07 DIAGNOSIS — H2513 Age-related nuclear cataract, bilateral: Secondary | ICD-10-CM | POA: Diagnosis not present

## 2017-12-19 DIAGNOSIS — L304 Erythema intertrigo: Secondary | ICD-10-CM | POA: Diagnosis not present

## 2017-12-19 DIAGNOSIS — L820 Inflamed seborrheic keratosis: Secondary | ICD-10-CM | POA: Diagnosis not present

## 2017-12-19 DIAGNOSIS — D225 Melanocytic nevi of trunk: Secondary | ICD-10-CM | POA: Diagnosis not present

## 2017-12-19 DIAGNOSIS — Z85820 Personal history of malignant melanoma of skin: Secondary | ICD-10-CM | POA: Diagnosis not present

## 2017-12-26 DIAGNOSIS — M170 Bilateral primary osteoarthritis of knee: Secondary | ICD-10-CM | POA: Diagnosis not present

## 2018-03-27 DIAGNOSIS — Z23 Encounter for immunization: Secondary | ICD-10-CM | POA: Diagnosis not present

## 2018-04-14 DIAGNOSIS — M25561 Pain in right knee: Secondary | ICD-10-CM | POA: Diagnosis not present

## 2018-04-14 DIAGNOSIS — M1711 Unilateral primary osteoarthritis, right knee: Secondary | ICD-10-CM | POA: Diagnosis not present

## 2018-04-14 DIAGNOSIS — M170 Bilateral primary osteoarthritis of knee: Secondary | ICD-10-CM | POA: Diagnosis not present

## 2018-04-14 DIAGNOSIS — M25562 Pain in left knee: Secondary | ICD-10-CM | POA: Diagnosis not present

## 2018-08-20 DIAGNOSIS — G629 Polyneuropathy, unspecified: Secondary | ICD-10-CM | POA: Diagnosis not present

## 2018-10-27 DIAGNOSIS — G629 Polyneuropathy, unspecified: Secondary | ICD-10-CM | POA: Diagnosis not present

## 2018-10-27 DIAGNOSIS — I10 Essential (primary) hypertension: Secondary | ICD-10-CM | POA: Diagnosis not present

## 2018-10-27 DIAGNOSIS — Z79899 Other long term (current) drug therapy: Secondary | ICD-10-CM | POA: Diagnosis not present

## 2018-10-27 DIAGNOSIS — E7800 Pure hypercholesterolemia, unspecified: Secondary | ICD-10-CM | POA: Diagnosis not present

## 2018-10-27 DIAGNOSIS — Z113 Encounter for screening for infections with a predominantly sexual mode of transmission: Secondary | ICD-10-CM | POA: Diagnosis not present

## 2018-10-27 DIAGNOSIS — E559 Vitamin D deficiency, unspecified: Secondary | ICD-10-CM | POA: Diagnosis not present

## 2018-10-27 DIAGNOSIS — Z1159 Encounter for screening for other viral diseases: Secondary | ICD-10-CM | POA: Diagnosis not present

## 2018-10-27 DIAGNOSIS — R5383 Other fatigue: Secondary | ICD-10-CM | POA: Diagnosis not present

## 2018-10-27 DIAGNOSIS — Z1331 Encounter for screening for depression: Secondary | ICD-10-CM | POA: Diagnosis not present

## 2018-10-27 DIAGNOSIS — Z1339 Encounter for screening examination for other mental health and behavioral disorders: Secondary | ICD-10-CM | POA: Diagnosis not present

## 2018-10-27 DIAGNOSIS — Z0000 Encounter for general adult medical examination without abnormal findings: Secondary | ICD-10-CM | POA: Diagnosis not present

## 2018-10-28 ENCOUNTER — Other Ambulatory Visit: Payer: Self-pay | Admitting: Nurse Practitioner

## 2018-10-28 DIAGNOSIS — Z1231 Encounter for screening mammogram for malignant neoplasm of breast: Secondary | ICD-10-CM

## 2018-10-30 DIAGNOSIS — M170 Bilateral primary osteoarthritis of knee: Secondary | ICD-10-CM | POA: Diagnosis not present

## 2018-11-07 DIAGNOSIS — M170 Bilateral primary osteoarthritis of knee: Secondary | ICD-10-CM | POA: Diagnosis not present

## 2018-11-13 DIAGNOSIS — M170 Bilateral primary osteoarthritis of knee: Secondary | ICD-10-CM | POA: Diagnosis not present

## 2019-02-02 DIAGNOSIS — H2513 Age-related nuclear cataract, bilateral: Secondary | ICD-10-CM | POA: Diagnosis not present

## 2019-02-02 DIAGNOSIS — H524 Presbyopia: Secondary | ICD-10-CM | POA: Diagnosis not present

## 2019-02-02 DIAGNOSIS — H35033 Hypertensive retinopathy, bilateral: Secondary | ICD-10-CM | POA: Diagnosis not present

## 2019-02-02 DIAGNOSIS — H53023 Refractive amblyopia, bilateral: Secondary | ICD-10-CM | POA: Diagnosis not present

## 2019-03-17 HISTORY — PX: CATARACT EXTRACTION: SUR2

## 2019-03-23 DIAGNOSIS — H2512 Age-related nuclear cataract, left eye: Secondary | ICD-10-CM | POA: Diagnosis not present

## 2019-03-23 DIAGNOSIS — H2513 Age-related nuclear cataract, bilateral: Secondary | ICD-10-CM | POA: Diagnosis not present

## 2019-03-23 DIAGNOSIS — H35033 Hypertensive retinopathy, bilateral: Secondary | ICD-10-CM | POA: Diagnosis not present

## 2019-03-23 DIAGNOSIS — H35013 Changes in retinal vascular appearance, bilateral: Secondary | ICD-10-CM | POA: Diagnosis not present

## 2019-03-23 DIAGNOSIS — H25013 Cortical age-related cataract, bilateral: Secondary | ICD-10-CM | POA: Diagnosis not present

## 2019-04-07 DIAGNOSIS — H2512 Age-related nuclear cataract, left eye: Secondary | ICD-10-CM | POA: Diagnosis not present

## 2019-05-04 DIAGNOSIS — H2511 Age-related nuclear cataract, right eye: Secondary | ICD-10-CM | POA: Diagnosis not present

## 2019-05-04 DIAGNOSIS — H25011 Cortical age-related cataract, right eye: Secondary | ICD-10-CM | POA: Diagnosis not present

## 2019-05-18 ENCOUNTER — Other Ambulatory Visit: Payer: Self-pay | Admitting: Nurse Practitioner

## 2019-05-18 DIAGNOSIS — N950 Postmenopausal bleeding: Secondary | ICD-10-CM

## 2019-05-18 DIAGNOSIS — R102 Pelvic and perineal pain: Secondary | ICD-10-CM | POA: Diagnosis not present

## 2019-05-18 DIAGNOSIS — N8502 Endometrial intraepithelial neoplasia [EIN]: Secondary | ICD-10-CM | POA: Diagnosis not present

## 2019-05-18 HISTORY — DX: Postmenopausal bleeding: N95.0

## 2019-05-21 DIAGNOSIS — N950 Postmenopausal bleeding: Secondary | ICD-10-CM | POA: Diagnosis not present

## 2019-05-21 DIAGNOSIS — R102 Pelvic and perineal pain: Secondary | ICD-10-CM | POA: Diagnosis not present

## 2019-05-26 ENCOUNTER — Telehealth: Payer: Self-pay | Admitting: *Deleted

## 2019-05-26 NOTE — Telephone Encounter (Signed)
Attempted to call the patient to schedule a new patient appt; patient requested that I call her back after 2pm

## 2019-05-26 NOTE — Telephone Encounter (Signed)
Per patient request, I have called her back after 2pm. Patient scheduled for 2/15 at 11:30am

## 2019-05-28 ENCOUNTER — Encounter: Payer: Self-pay | Admitting: *Deleted

## 2019-05-29 ENCOUNTER — Encounter: Payer: Self-pay | Admitting: Gynecologic Oncology

## 2019-05-29 NOTE — H&P (View-Only) (Signed)
GYNECOLOGIC ONCOLOGY NEW PATIENT CONSULTATION   Patient Name: Heidi Avila  Patient Age: 74 y.o. Date of Service: 06/01/19 Referring Provider: No referring provider defined for this encounter.   Primary Care Provider: Patient, No Pcp Per Consulting Provider: Jeral Pinch, MD   Assessment/Plan:  We reviewed the diagnosis of complex atypical hyperplasia (CAH) and the treatment options, including medical management (Mirena IUD or progesterone PO) or hysterectomy.    Patient desires to proceed with surgical management.  The patient is a suitable candidate for hysterectomy via a minimally invasive approach to surgery.  Given that she is postmenopausal, a bilateral salpingo-oophorectomy is also recommended.  We reviewed that robotic assistance would be used to complete the surgery.  We discussed that endometrial cancer is detected in about 40% of final uterine pathology specimens from patients with CAH.  While the standard of care for treatment of complex atypical hyperplasia is total hysterectomy with sending the uterus for intraoperative frozen pathology to help dictate need for lymph node dissection, I discussed that I offer sentinel lymph node biopsy to patient's giving the significant risk of cancer on final pathology.  If the patient were not to map unilaterally or bilaterally, then I would plan to send the uterus for intraoperative frozen section.  Ultimately, after our discussion of the risks and benefits of sentinel lymph node biopsy versus delaying lymph node assessment until intraoperative review of the uterine specimen, the patient desired to proceed with sentinel lymph node biopsy.    We reviewed the sentinel lymph node technique. Risks and benefits of sentinel lymph node biopsy was reviewed. We reviewed the technique and ICG dye. The patient DOES NOT have an iodine allergy or known liver dysfunction. We reviewed the false negative rate (0.4%), and that 3% of patients with metastatic  disease will not have it detected by SLN biopsy in endometrial cancer. A low risk of allergic reaction to the dye, <0.2% for ICG, has been reported. We also discussed that in the case of failed mapping, which occurs 40% of the time, a bilateral or unilateral lymphadenectomy will be performed at the surgeon's discretion.   Potential benefits of sentinel nodes including a higher detection rate for metastasis due to ultrastaging and potential reduction in operative morbidity. However, there remains uncertainty as to the role for treatment of micrometastatic disease. Further, the benefit of operative morbidity associated with the SLN technique in endometrial cancer is not yet completely known. In other patient populations (e.g. the cervical cancer population) there has been observed reductions in morbidity with SLN biopsy compared to pelvic lymphadenectomy. Lymphedema, nerve dysfunction and lymphocysts are all potential risks with the SLN technique as with complete lymphadenectomy. Additional risks to the patient include the risk of damage to an internal organ while operating in an altered view (e.g. the black and white image of the robotic fluorescence imaging mode).   We discussed the plan for a robotic assisted hysterectomy, bilateral salpingo-oophorectomy, sentinel lymph node evaluation, possible lymph node dissection, possible laparotomy. The risks of surgery were discussed in detail and she understands these to include infection; wound separation; hernia; vaginal cuff separation, injury to adjacent organs such as bowel, bladder, blood vessels, ureters and nerves; bleeding which may require blood transfusion; anesthesia risk; thromboembolic events; possible death; unforeseen complications; possible need for re-exploration; medical complications such as heart attack, stroke, pleural effusion and pneumonia; and, if full lymphadenectomy is performed the risk of lymphedema and lymphocyst. The patient will receive  DVT and antibiotic prophylaxis as indicated. She voiced  a clear understanding. She had the opportunity to ask questions. Perioperative instructions were reviewed with her. Prescriptions for post-op medications were sent to her pharmacy of choice.  Given likely METs of more than 4 but some shortness of breath with ambulation, plan to obtain surgical clearance from her primary care provider.  Additionally, given proximity of cataract surgery (scheduled on 3/3), we will reach out to her ophthalmologist to assure Trendelenburg is safe in such close proximity to their surgery.  A copy of this note was sent to the patient's referring provider.   45 minutes of total time was spent for this patient encounter, including preparation, face-to-face counseling with the patient and coordination of care, and documentation of the encounter.  Jeral Pinch, MD  Division of Gynecologic Oncology  Department of Obstetrics and Gynecology  [facility]  ___________________________________________  Chief Complaint: Chief Complaint  Patient presents with  . Complex atypical endometrial hyperplasia    History of Present Illness:  Heidi Avila is a 74 y.o. y.o. female who is seen in consultation at the request of No ref. provider found for an evaluation of complex atypical hyperplasia.  The patient was initially seen in early February with postmenopausal bleeding.  She was started on 10 mg of Provera daily.  She subsequently underwent endometrial biopsy as well as ultrasound.  Endometrial biopsy revealed complex atypical hyperplasia, possible focus suspicious for grade 1 endometrial cancer.  Patient reports having multiple episodes of postmenopausal bleeding since menopause in her mid 12s.  She has had these episodes before when she "overdoes it."  Around Christmas time 2020, she was working at the [employer] as well as working on [employer] that she has.  She began having  bleeding that was fairly heavy but thought it would stop once she was not so busy and running around.  When things slow down, her bleeding got heavier which prompted her to be seen.  She has had some cramping since endometrial biopsy and her bleeding has almost stopped completely.  She started taking daily Provera the day after her biopsy.  Endorses having a good appetite without nausea or emesis.  She endorses a history of chronic constipation for which she takes Amitiza and fiber.  She denies any change to her urinary function.  She denies any chest pain with ambulation but does have some shortness of breath when she ambulates approximately 1 block.  She is unsure if she could walk up a flight of stairs without getting short of breath.  She notes a history of having pneumonia several times but has not had it in the last several years.  PAST MEDICAL HISTORY:  Past Medical History:  Diagnosis Date  . Cataract   . Complex endometrial hyperplasia with atypia   . GERD (gastroesophageal reflux disease)   . Heart murmur   . Hypertension   . Obesity   . Post-menopausal bleeding 05/18/2019     PAST SURGICAL HISTORY:  Past Surgical History:  Procedure Laterality Date  . BIOPSY ENDOMETRIAL    . CATARACT EXTRACTION  03/2019  . ESOPHAGOGASTRODUODENOSCOPY    . TONSILLECTOMY    . TOTAL HIP ARTHROPLASTY Left   . WISDOM TOOTH EXTRACTION      OB/GYN HISTORY:  OB History  Gravida Para Term Preterm AB Living  1 1          SAB TAB Ectopic Multiple Live Births               #  Outcome Date GA Lbr Len/2nd Weight Sex Delivery Anes PTL Lv  1 Para             No LMP recorded. Patient is postmenopausal.  Age at menarche: 44 Age at menopause: 35 Hx of HRT: Denies Hx of STDs: Denies Last pap: 2012 History of abnormal pap smears: Denies  SCREENING STUDIES:  Last mammogram: About a year ago  Last colonoscopy: 06/2015 Per documentation: Patient thinks that she is due again next year the year after  that she has had some polyps found on colonoscopy  MEDICATIONS: Outpatient Encounter Medications as of 06/01/2019  Medication Sig  . AMITIZA 24 MCG capsule Take 24 mcg by mouth 2 (two) times daily.  Marland Kitchen azithromycin (ZITHROMAX) 250 MG tablet azithromycin 250 mg tablet  . b complex vitamins capsule Frequency:daily   Dosage:0.0     Instructions:Vitamin B Complex ( TABS, 1 Oral daily)  Note:  . benazepril-hydrochlorthiazide (LOTENSIN HCT) 20-25 MG tablet benazepril 20 mg-hydrochlorothiazide 25 mg tablet  . gabapentin (NEURONTIN) 600 MG tablet gabapentin 600 mg tablet  . ketorolac (ACULAR) 0.5 % ophthalmic solution Place 1 drop into the left eye 4 (four) times daily.  . medroxyPROGESTERone (PROVERA) 10 MG tablet Take 10 mg by mouth daily.  . meloxicam (MOBIC) 15 MG tablet Take 15 mg by mouth daily.  Marland Kitchen ofloxacin (OCUFLOX) 0.3 % ophthalmic solution 1 drop 4 (four) times daily.  . prednisoLONE acetate (PRED FORTE) 1 % ophthalmic suspension Place 1 drop into the left eye 4 (four) times daily.  Marland Kitchen topiramate (TOPAMAX) 25 MG tablet Take 75 mg by mouth at bedtime.  . senna-docusate (SENOKOT-S) 8.6-50 MG tablet Take 2 tablets by mouth at bedtime. For AFTER surgery, do not take if having diarrhea  . tiZANidine (ZANAFLEX) 4 MG tablet Take 4 mg by mouth at bedtime.  . traMADol (ULTRAM) 50 MG tablet Take 1 tablet (50 mg total) by mouth every 6 (six) hours as needed for severe pain. For AFTER surgery, do not take and drive  . [DISCONTINUED] atropine 1 % ophthalmic solution Place 1 drop into the left eye 3 (three) times daily.  . [DISCONTINUED] BENAZEPRIL HCL PO Take by mouth.  . [DISCONTINUED] brimonidine (ALPHAGAN) 0.2 % ophthalmic solution Place 1 drop into the left eye 3 (three) times daily.  . [DISCONTINUED] gabapentin (NEURONTIN) 600 MG tablet   . [DISCONTINUED] GABAPENTIN PO Take by mouth.  . [DISCONTINUED] HYDROcodone-acetaminophen (NORCO/VICODIN) 5-325 MG tablet hydrocodone 5 mg-acetaminophen 325 mg  tablet  . [DISCONTINUED] ketoconazole (NIZORAL) 2 % cream ketoconazole 2 % topical cream  . [DISCONTINUED] lactulose (CHRONULAC) 10 GM/15ML solution lactulose 10 gram/15 mL oral solution  . [DISCONTINUED] predniSONE (DELTASONE) 50 MG tablet prednisone 50 mg tablet  . [DISCONTINUED] TIZANIDINE HCL PO Take by mouth.   No facility-administered encounter medications on file as of 06/01/2019.    ALLERGIES:  Allergies  Allergen Reactions  . Amoxicillin Rash     FAMILY HISTORY:  Family History  Problem Relation Age of Onset  . Colon cancer Mother 48       treated with surgery only  . Lung cancer Mother   . CVA Father   . Endometrial cancer Neg Hx   . Ovarian cancer Neg Hx   . Breast cancer Neg Hx      SOCIAL HISTORY:    Social Connections:   . Frequency of Communication with Friends and Family: Not on file  . Frequency of Social Gatherings with Friends and Family: Not on file  . Attends  Religious Services: Not on file  . Active Member of Clubs or Organizations: Not on file  . Attends Archivist Meetings: Not on file  . Marital Status: Not on file    REVIEW OF SYSTEMS:  Denies appetite changes, fevers, chills, fatigue, unexplained weight changes. Denies hearing loss, neck lumps or masses, mouth sores, ringing in ears or voice changes. Denies cough or wheezing.  Denies shortness of breath. Denies chest pain or palpitations. Denies leg swelling. Denies abdominal distention, pain, blood in stools, constipation, diarrhea, nausea, vomiting, or early satiety. Denies pain with intercourse, dysuria, frequency, hematuria or incontinence. Denies hot flashes, pelvic pain, vaginal bleeding or vaginal discharge.   Denies joint pain, back pain or muscle pain/cramps. Denies itching, rash, or wounds. Denies dizziness, headaches, numbness or seizures. Denies swollen lymph nodes or glands, denies easy bruising or bleeding. Denies anxiety, depression, confusion, or decreased  concentration.  Physical Exam:  Vital Signs for this encounter:  Blood pressure (!) 155/89, pulse 63, temperature 98.1 F (36.7 C), temperature source Temporal, resp. rate 20, height 5\' 4"  (1.626 m), weight 246 lb (111.6 kg), SpO2 98 %. Body mass index is 42.23 kg/m. General: Alert, oriented, no acute distress.  HEENT: Normocephalic, atraumatic. Sclera anicteric.  Chest: Clear to auscultation bilaterally. No wheezes, rhonchi, or rales. Cardiovascular: Regular rate and rhythm, no murmurs, rubs, or gallops.  Abdomen: Obese. Normoactive bowel sounds. Soft, nondistended, nontender to palpation. No masses or hepatosplenomegaly appreciated. No palpable fluid wave.  Extremities: Grossly normal range of motion. Warm, well perfused. No edema bilaterally.  Skin: No rashes or lesions.  Lymphatics: No cervical, supraclavicular, or inguinal adenopathy.  GU:  Normal external female genitalia.  No lesions. No discharge or bleeding.             Bladder/urethra:  No lesions or masses, well supported bladder             Vagina: Mildly atrophic.  Narrow apex of the vagina.             Cervix: Normal appearing, no lesions, small.             Uterus: Small, mobile, no parametrial involvement or nodularity.             Adnexa: No masses.  Rectal: No nodularity.  LABORATORY AND RADIOLOGIC DATA:  Outside medical records were reviewed to synthesize the above history, along with the history and physical obtained during the visit.   No results found for: WBC, HGB, HCT, PLT, GLUCOSE, CHOL, TRIG, HDL, LDLDIRECT, LDLCALC, ALT, AST, NA, K, CL, CREATININE, BUN, CO2, TSH, PSA, INR, GLUF, HGBA1C, MICROALBUR  Endometrial biopsy on 05/18/2019: Complex atypical hyperplasia, cannot rule out microscopic focus of FIGO grade 1 carcinoma  Pelvic ultrasound at [facility] on 05/21/2019: Anteverted uterus measuring 8.2 cm in greatest dimension.  Endometrial lining measures 1.73 cm and is thickened.  Left ovary not visualized, right  ovary normal in appearance.

## 2019-05-29 NOTE — Progress Notes (Signed)
GYNECOLOGIC ONCOLOGY NEW PATIENT CONSULTATION   Patient Name: Heidi Avila  Patient Age: 74 y.o. Date of Service: 06/01/19 Referring Provider: No referring provider defined for this encounter.   Primary Care Provider: Patient, No Pcp Per Consulting Provider: Jeral Pinch, MD   Assessment/Plan:  We reviewed the diagnosis of complex atypical hyperplasia (CAH) and the treatment options, including medical management (Mirena IUD or progesterone PO) or hysterectomy.    Patient desires to proceed with surgical management.  The patient is a suitable candidate for hysterectomy via a minimally invasive approach to surgery.  Given that she is postmenopausal, a bilateral salpingo-oophorectomy is also recommended.  We reviewed that robotic assistance would be used to complete the surgery.  We discussed that endometrial cancer is detected in about 40% of final uterine pathology specimens from patients with CAH.  While the standard of care for treatment of complex atypical hyperplasia is total hysterectomy with sending the uterus for intraoperative frozen pathology to help dictate need for lymph node dissection, I discussed that I offer sentinel lymph node biopsy to patient's giving the significant risk of cancer on final pathology.  If the patient were not to map unilaterally or bilaterally, then I would plan to send the uterus for intraoperative frozen section.  Ultimately, after our discussion of the risks and benefits of sentinel lymph node biopsy versus delaying lymph node assessment until intraoperative review of the uterine specimen, the patient desired to proceed with sentinel lymph node biopsy.    We reviewed the sentinel lymph node technique. Risks and benefits of sentinel lymph node biopsy was reviewed. We reviewed the technique and ICG dye. The patient DOES NOT have an iodine allergy or known liver dysfunction. We reviewed the false negative rate (0.4%), and that 3% of patients with metastatic  disease will not have it detected by SLN biopsy in endometrial cancer. A low risk of allergic reaction to the dye, <0.2% for ICG, has been reported. We also discussed that in the case of failed mapping, which occurs 40% of the time, a bilateral or unilateral lymphadenectomy will be performed at the surgeon's discretion.   Potential benefits of sentinel nodes including a higher detection rate for metastasis due to ultrastaging and potential reduction in operative morbidity. However, there remains uncertainty as to the role for treatment of micrometastatic disease. Further, the benefit of operative morbidity associated with the SLN technique in endometrial cancer is not yet completely known. In other patient populations (e.g. the cervical cancer population) there has been observed reductions in morbidity with SLN biopsy compared to pelvic lymphadenectomy. Lymphedema, nerve dysfunction and lymphocysts are all potential risks with the SLN technique as with complete lymphadenectomy. Additional risks to the patient include the risk of damage to an internal organ while operating in an altered view (e.g. the black and white image of the robotic fluorescence imaging mode).   We discussed the plan for a robotic assisted hysterectomy, bilateral salpingo-oophorectomy, sentinel lymph node evaluation, possible lymph node dissection, possible laparotomy. The risks of surgery were discussed in detail and she understands these to include infection; wound separation; hernia; vaginal cuff separation, injury to adjacent organs such as bowel, bladder, blood vessels, ureters and nerves; bleeding which may require blood transfusion; anesthesia risk; thromboembolic events; possible death; unforeseen complications; possible need for re-exploration; medical complications such as heart attack, stroke, pleural effusion and pneumonia; and, if full lymphadenectomy is performed the risk of lymphedema and lymphocyst. The patient will receive  DVT and antibiotic prophylaxis as indicated. She voiced  a clear understanding. She had the opportunity to ask questions. Perioperative instructions were reviewed with her. Prescriptions for post-op medications were sent to her pharmacy of choice.  Given likely METs of more than 4 but some shortness of breath with ambulation, plan to obtain surgical clearance from her primary care provider.  Additionally, given proximity of cataract surgery (scheduled on 3/3), we will reach out to her ophthalmologist to assure Trendelenburg is safe in such close proximity to their surgery.  A copy of this note was sent to the patient's referring provider.   45 minutes of total time was spent for this patient encounter, including preparation, face-to-face counseling with the patient and coordination of care, and documentation of the encounter.  Jeral Pinch, MD  Division of Gynecologic Oncology  Department of Obstetrics and Gynecology  [facility]  ___________________________________________  Chief Complaint: Chief Complaint  Patient presents with  . Complex atypical endometrial hyperplasia    History of Present Illness:  Heidi Avila is a 74 y.o. y.o. female who is seen in consultation at the request of No ref. provider found for an evaluation of complex atypical hyperplasia.  The patient was initially seen in early February with postmenopausal bleeding.  She was started on 10 mg of Provera daily.  She subsequently underwent endometrial biopsy as well as ultrasound.  Endometrial biopsy revealed complex atypical hyperplasia, possible focus suspicious for grade 1 endometrial cancer.  Patient reports having multiple episodes of postmenopausal bleeding since menopause in her mid 23s.  She has had these episodes before when she "overdoes it."  Around Christmas time 2020, she was working at the [employer] as well as working on [employer] that she has.  She began having  bleeding that was fairly heavy but thought it would stop once she was not so busy and running around.  When things slow down, her bleeding got heavier which prompted her to be seen.  She has had some cramping since endometrial biopsy and her bleeding has almost stopped completely.  She started taking daily Provera the day after her biopsy.  Endorses having a good appetite without nausea or emesis.  She endorses a history of chronic constipation for which she takes Amitiza and fiber.  She denies any change to her urinary function.  She denies any chest pain with ambulation but does have some shortness of breath when she ambulates approximately 1 block.  She is unsure if she could walk up a flight of stairs without getting short of breath.  She notes a history of having pneumonia several times but has not had it in the last several years.  PAST MEDICAL HISTORY:  Past Medical History:  Diagnosis Date  . Cataract   . Complex endometrial hyperplasia with atypia   . GERD (gastroesophageal reflux disease)   . Heart murmur   . Hypertension   . Obesity   . Post-menopausal bleeding 05/18/2019     PAST SURGICAL HISTORY:  Past Surgical History:  Procedure Laterality Date  . BIOPSY ENDOMETRIAL    . CATARACT EXTRACTION  03/2019  . ESOPHAGOGASTRODUODENOSCOPY    . TONSILLECTOMY    . TOTAL HIP ARTHROPLASTY Left   . WISDOM TOOTH EXTRACTION      OB/GYN HISTORY:  OB History  Gravida Para Term Preterm AB Living  1 1          SAB TAB Ectopic Multiple Live Births               #  Outcome Date GA Lbr Len/2nd Weight Sex Delivery Anes PTL Lv  1 Para             No LMP recorded. Patient is postmenopausal.  Age at menarche: 40 Age at menopause: 76 Hx of HRT: Denies Hx of STDs: Denies Last pap: 2012 History of abnormal pap smears: Denies  SCREENING STUDIES:  Last mammogram: About a year ago  Last colonoscopy: 06/2015 Per documentation: Patient thinks that she is due again next year the year after  that she has had some polyps found on colonoscopy  MEDICATIONS: Outpatient Encounter Medications as of 06/01/2019  Medication Sig  . AMITIZA 24 MCG capsule Take 24 mcg by mouth 2 (two) times daily.  Marland Kitchen azithromycin (ZITHROMAX) 250 MG tablet azithromycin 250 mg tablet  . b complex vitamins capsule Frequency:daily   Dosage:0.0     Instructions:Vitamin B Complex ( TABS, 1 Oral daily)  Note:  . benazepril-hydrochlorthiazide (LOTENSIN HCT) 20-25 MG tablet benazepril 20 mg-hydrochlorothiazide 25 mg tablet  . gabapentin (NEURONTIN) 600 MG tablet gabapentin 600 mg tablet  . ketorolac (ACULAR) 0.5 % ophthalmic solution Place 1 drop into the left eye 4 (four) times daily.  . medroxyPROGESTERone (PROVERA) 10 MG tablet Take 10 mg by mouth daily.  . meloxicam (MOBIC) 15 MG tablet Take 15 mg by mouth daily.  Marland Kitchen ofloxacin (OCUFLOX) 0.3 % ophthalmic solution 1 drop 4 (four) times daily.  . prednisoLONE acetate (PRED FORTE) 1 % ophthalmic suspension Place 1 drop into the left eye 4 (four) times daily.  Marland Kitchen topiramate (TOPAMAX) 25 MG tablet Take 75 mg by mouth at bedtime.  . senna-docusate (SENOKOT-S) 8.6-50 MG tablet Take 2 tablets by mouth at bedtime. For AFTER surgery, do not take if having diarrhea  . tiZANidine (ZANAFLEX) 4 MG tablet Take 4 mg by mouth at bedtime.  . traMADol (ULTRAM) 50 MG tablet Take 1 tablet (50 mg total) by mouth every 6 (six) hours as needed for severe pain. For AFTER surgery, do not take and drive  . [DISCONTINUED] atropine 1 % ophthalmic solution Place 1 drop into the left eye 3 (three) times daily.  . [DISCONTINUED] BENAZEPRIL HCL PO Take by mouth.  . [DISCONTINUED] brimonidine (ALPHAGAN) 0.2 % ophthalmic solution Place 1 drop into the left eye 3 (three) times daily.  . [DISCONTINUED] gabapentin (NEURONTIN) 600 MG tablet   . [DISCONTINUED] GABAPENTIN PO Take by mouth.  . [DISCONTINUED] HYDROcodone-acetaminophen (NORCO/VICODIN) 5-325 MG tablet hydrocodone 5 mg-acetaminophen 325 mg  tablet  . [DISCONTINUED] ketoconazole (NIZORAL) 2 % cream ketoconazole 2 % topical cream  . [DISCONTINUED] lactulose (CHRONULAC) 10 GM/15ML solution lactulose 10 gram/15 mL oral solution  . [DISCONTINUED] predniSONE (DELTASONE) 50 MG tablet prednisone 50 mg tablet  . [DISCONTINUED] TIZANIDINE HCL PO Take by mouth.   No facility-administered encounter medications on file as of 06/01/2019.    ALLERGIES:  Allergies  Allergen Reactions  . Amoxicillin Rash     FAMILY HISTORY:  Family History  Problem Relation Age of Onset  . Colon cancer Mother 16       treated with surgery only  . Lung cancer Mother   . CVA Father   . Endometrial cancer Neg Hx   . Ovarian cancer Neg Hx   . Breast cancer Neg Hx      SOCIAL HISTORY:    Social Connections:   . Frequency of Communication with Friends and Family: Not on file  . Frequency of Social Gatherings with Friends and Family: Not on file  . Attends  Religious Services: Not on file  . Active Member of Clubs or Organizations: Not on file  . Attends Archivist Meetings: Not on file  . Marital Status: Not on file    REVIEW OF SYSTEMS:  Denies appetite changes, fevers, chills, fatigue, unexplained weight changes. Denies hearing loss, neck lumps or masses, mouth sores, ringing in ears or voice changes. Denies cough or wheezing.  Denies shortness of breath. Denies chest pain or palpitations. Denies leg swelling. Denies abdominal distention, pain, blood in stools, constipation, diarrhea, nausea, vomiting, or early satiety. Denies pain with intercourse, dysuria, frequency, hematuria or incontinence. Denies hot flashes, pelvic pain, vaginal bleeding or vaginal discharge.   Denies joint pain, back pain or muscle pain/cramps. Denies itching, rash, or wounds. Denies dizziness, headaches, numbness or seizures. Denies swollen lymph nodes or glands, denies easy bruising or bleeding. Denies anxiety, depression, confusion, or decreased  concentration.  Physical Exam:  Vital Signs for this encounter:  Blood pressure (!) 155/89, pulse 63, temperature 98.1 F (36.7 C), temperature source Temporal, resp. rate 20, height 5\' 4"  (1.626 m), weight 246 lb (111.6 kg), SpO2 98 %. Body mass index is 42.23 kg/m. General: Alert, oriented, no acute distress.  HEENT: Normocephalic, atraumatic. Sclera anicteric.  Chest: Clear to auscultation bilaterally. No wheezes, rhonchi, or rales. Cardiovascular: Regular rate and rhythm, no murmurs, rubs, or gallops.  Abdomen: Obese. Normoactive bowel sounds. Soft, nondistended, nontender to palpation. No masses or hepatosplenomegaly appreciated. No palpable fluid wave.  Extremities: Grossly normal range of motion. Warm, well perfused. No edema bilaterally.  Skin: No rashes or lesions.  Lymphatics: No cervical, supraclavicular, or inguinal adenopathy.  GU:  Normal external female genitalia.  No lesions. No discharge or bleeding.             Bladder/urethra:  No lesions or masses, well supported bladder             Vagina: Mildly atrophic.  Narrow apex of the vagina.             Cervix: Normal appearing, no lesions, small.             Uterus: Small, mobile, no parametrial involvement or nodularity.             Adnexa: No masses.  Rectal: No nodularity.  LABORATORY AND RADIOLOGIC DATA:  Outside medical records were reviewed to synthesize the above history, along with the history and physical obtained during the visit.   No results found for: WBC, HGB, HCT, PLT, GLUCOSE, CHOL, TRIG, HDL, LDLDIRECT, LDLCALC, ALT, AST, NA, K, CL, CREATININE, BUN, CO2, TSH, PSA, INR, GLUF, HGBA1C, MICROALBUR  Endometrial biopsy on 05/18/2019: Complex atypical hyperplasia, cannot rule out microscopic focus of FIGO grade 1 carcinoma  Pelvic ultrasound at [facility] on 05/21/2019: Anteverted uterus measuring 8.2 cm in greatest dimension.  Endometrial lining measures 1.73 cm and is thickened.  Left ovary not visualized, right  ovary normal in appearance.

## 2019-06-01 ENCOUNTER — Other Ambulatory Visit: Payer: Self-pay

## 2019-06-01 ENCOUNTER — Telehealth: Payer: Self-pay | Admitting: *Deleted

## 2019-06-01 ENCOUNTER — Other Ambulatory Visit: Payer: Self-pay | Admitting: Gynecologic Oncology

## 2019-06-01 ENCOUNTER — Inpatient Hospital Stay: Payer: Medicare Other | Attending: Gynecologic Oncology | Admitting: Gynecologic Oncology

## 2019-06-01 ENCOUNTER — Encounter: Payer: Self-pay | Admitting: Gynecologic Oncology

## 2019-06-01 VITALS — BP 155/89 | HR 63 | Temp 98.1°F | Resp 20 | Ht 64.0 in | Wt 246.0 lb

## 2019-06-01 DIAGNOSIS — N8502 Endometrial intraepithelial neoplasia [EIN]: Secondary | ICD-10-CM

## 2019-06-01 DIAGNOSIS — Z791 Long term (current) use of non-steroidal anti-inflammatories (NSAID): Secondary | ICD-10-CM | POA: Insufficient documentation

## 2019-06-01 DIAGNOSIS — Z6841 Body Mass Index (BMI) 40.0 and over, adult: Secondary | ICD-10-CM | POA: Diagnosis not present

## 2019-06-01 DIAGNOSIS — I10 Essential (primary) hypertension: Secondary | ICD-10-CM | POA: Diagnosis not present

## 2019-06-01 DIAGNOSIS — Z79899 Other long term (current) drug therapy: Secondary | ICD-10-CM | POA: Insufficient documentation

## 2019-06-01 DIAGNOSIS — K219 Gastro-esophageal reflux disease without esophagitis: Secondary | ICD-10-CM | POA: Diagnosis not present

## 2019-06-01 DIAGNOSIS — R011 Cardiac murmur, unspecified: Secondary | ICD-10-CM | POA: Insufficient documentation

## 2019-06-01 DIAGNOSIS — E669 Obesity, unspecified: Secondary | ICD-10-CM | POA: Insufficient documentation

## 2019-06-01 MED ORDER — SENNOSIDES-DOCUSATE SODIUM 8.6-50 MG PO TABS: 2.0000 | ORAL_TABLET | Freq: Every day | ORAL | 1 refills | Status: DC

## 2019-06-01 MED ORDER — TRAMADOL HCL 50 MG PO TABS: 50.0000 mg | ORAL_TABLET | Freq: Four times a day (QID) | ORAL | 0 refills | Status: DC | PRN

## 2019-06-01 NOTE — Patient Instructions (Signed)
Preparing for your Surgery  Plan for surgery on June 30, 2019 with Dr. Bertram Denver at [facility] will be scheduled for a robotic assisted total laparoscopic hysterectomy, bilateral salpingo-oophorectomy, sentinel lymph node biopsy, possible lymph node dissection, possible laparotomy.   Pre-operative Testing -You will receive a phone call from presurgical testing at [facility] to discuss pre-surgery instructions and arrange for labs and COVID testing prior to your surgery.  -Bring your insurance card, copy of an advanced directive if applicable, medication list  -At that visit, you will be asked to sign a consent for a possible blood transfusion in case a transfusion becomes necessary during surgery.  The need for a blood transfusion is rare but having consent is a necessary part of your care.     -You should not be taking blood thinners or aspirin at least ten days prior to surgery unless instructed by your surgeon.  -STOP TAKING MOBIC 10 DAYS BEFORE SURGERY.  -Do not take supplements such as fish oil (omega 3), red yeast rice, tumeric before your surgery.   Day Before Surgery at [facility] will be asked to take in a light diet the day before surgery.  Avoid carbonated beverages.  You will be advised to have nothing to eat or drink after midnight the evening before.    Eat a light diet the day before surgery.  Examples including soups, broths, toast, yogurt, mashed potatoes.  Things to avoid include carbonated beverages (fizzy beverages), raw fruits and raw vegetables, or beans.   If your bowels are filled with gas, your surgeon will have difficulty visualizing your pelvic organs which increases your surgical risks.  Your role in recovery Your role is to become active as soon as directed by your doctor, while still giving yourself time to heal.  Rest when you feel tired. You will be asked to do the following in order to speed your recovery:  - Cough and breathe deeply.  This helps to clear and expand your lungs and can prevent pneumonia after surgery.  - [place]. Do mild physical activity. Walking or moving your legs help your circulation and body functions return to normal. Do not try to get up or walk alone the first time after surgery.   -If you develop swelling on one leg or the other, pain in the back of your leg, redness/warmth in one of your legs, please call the office or go to the Emergency Room to have a doppler to rule out a blood clot. For shortness of breath, chest pain-seek care in the Emergency Room as soon as possible. - Actively manage your pain. Managing your pain lets you move in comfort. We will ask you to rate your pain on a scale of zero to 10. It is your responsibility to tell your doctor or nurse where and how much you hurt so your pain can be treated.  Special Considerations -If you are diabetic, you may be placed on insulin after surgery to have closer control over your blood sugars to promote healing and recovery.  This does not mean that you will be discharged on insulin.  If applicable, your oral antidiabetics will be resumed when you are tolerating a solid diet.  -Your final pathology results from surgery should be available around one week after surgery and the results will be relayed to you when available.  -FMLA forms can be faxed to [phone] and please allow 5-7 business days for completion.  Pain Management After  Surgery -You will be prescribed your pain medication and bowel regimen medications before surgery so that you can have these available when you are discharged from the hospital. The pain medication is for use ONLY AFTER surgery and a new prescription will not be given.   -Make sure that you have Tylenol and Ibuprofen at home to use on a regular basis after surgery for pain control. We recommend alternating the medications every hour to six hours since they work differently and are processed in the  body differently for pain relief.  -Review the attached handout on narcotic use and their risks and side effects.   Bowel Regimen -You will be prescribed Sennakot-S to take nightly to prevent constipation especially if you are taking the narcotic pain medication intermittently.  It is important to prevent constipation and drink adequate amounts of liquids. You can stop taking this medication when you are not taking pain medication and you are back on your normal bowel routine.   Blood Transfusion Information (For the consent to be signed before surgery)  We will be checking your blood type before surgery so in case of emergencies, we will know what type of blood you would need.                                            WHAT IS A BLOOD TRANSFUSION?  A transfusion is the replacement of blood or some of its parts. Blood is made up of multiple cells which provide different functions.  Red blood cells carry oxygen and are used for blood loss replacement.  White blood cells fight against infection.  Platelets control bleeding.  Plasma helps clot blood.  Other blood products are available for specialized needs, such as hemophilia or other clotting disorders. BEFORE THE TRANSFUSION  Who gives blood for transfusions?   You may be able to donate blood to be used at a later date on yourself (autologous donation).  Relatives can be asked to donate blood. This is generally not any safer than if you have received blood from a stranger. The same precautions are taken to ensure safety when a relative's blood is donated.  Healthy volunteers who are fully evaluated to make sure their blood is safe. This is blood bank blood. Transfusion therapy is the safest it has ever been in the practice of medicine. Before blood is taken from a donor, a complete history is taken to make sure that person has no history of diseases nor engages in risky social behavior (examples are intravenous drug use or sexual  activity with multiple partners). The donor's travel history is screened to minimize risk of transmitting infections, such as malaria. The donated blood is tested for signs of infectious diseases, such as HIV and hepatitis. The blood is then tested to be sure it is compatible with you in order to minimize the chance of a transfusion reaction. If you or a relative donates blood, this is often done in anticipation of surgery and is not appropriate for emergency situations. It takes many days to process the donated blood. RISKS AND COMPLICATIONS Although transfusion therapy is very safe and saves many lives, the main dangers of transfusion include:   Getting an infectious disease.  Developing a transfusion reaction. This is an allergic reaction to something in the blood you were given. Every precaution is taken to prevent this. The decision to have a  blood transfusion has been considered carefully by your caregiver before blood is given. Blood is not given unless the benefits outweigh the risks.  AFTER SURGERY INSTRUCTIONS  Return to work: 4-6 weeks if applicable  Activity: 1. Be up and out of the bed during the day.  Take a nap if needed.  You may walk up steps but be careful and use the hand rail.  Stair climbing will tire you more than you think, you may need to stop part way and rest.   2. No lifting or straining for 6 weeks over 10 pounds. No pushing, pulling, straining for 6 weeks.  3. No driving for 1 week(s) if you were cleared to drive before surgery.  Do not drive if you are taking narcotic pain medicine.   4. You can shower as soon as the next day after surgery. Shower daily.  Use soap and water on your incision and pat dry; don't rub.  No tub baths or submerging your body in water until cleared by your surgeon. If you have the soap that was given to you by pre-surgical testing that was used before surgery, you do not need to use it afterwards because this can irritate your incisions.    5. No sexual activity and nothing in the vagina for 8 weeks.  6. You may experience a small amount of clear drainage from your incisions, which is normal.  If the drainage persists, increases, or changes color please call the office.  7. Do not use creams, lotions, or ointments such as neosporin on your incisions after surgery until advised by your surgeon because they can cause removal of the dermabond glue on your incisions.    8. You may experience vaginal spotting after surgery or around the 6-8 week mark from surgery when the stitches at the top of the vagina begin to dissolve.  The spotting is normal but if you experience heavy bleeding, call our office.  9. Take Tylenol or ibuprofen first for pain and only use narcotic pain medication for severe pain not relieved by the Tylenol or Ibuprofen.  Monitor your Tylenol intake to a max of 4,000 mg.  Diet: 1. Low sodium Heart Healthy Diet is recommended.  2. It is safe to use a laxative, such as Miralax or Colace, if you have difficulty moving your bowels. You can take Sennakot at bedtime every evening to keep bowel movements regular and to prevent constipation.    Wound Care: 1. Keep clean and dry.  Shower daily.  Reasons to call the Doctor:  Fever - Oral temperature greater than 100.4 degrees Fahrenheit  Foul-smelling vaginal discharge  Difficulty urinating  Nausea and vomiting  Increased pain at the site of the incision that is unrelieved with pain medicine.  Difficulty breathing with or without chest pain  New calf pain especially if only on one side  Sudden, continuing increased vaginal bleeding with or without clots.   Contacts: For questions or concerns you should contact:  Dr. Jeral Pinch at [phone]  Joylene John, NP at [phone]  After Hours: call [phone] and have the GYN Oncologist paged/contacted

## 2019-06-01 NOTE — Telephone Encounter (Signed)
Fax office note to referring office

## 2019-06-02 ENCOUNTER — Telehealth: Payer: Self-pay | Admitting: *Deleted

## 2019-06-02 ENCOUNTER — Telehealth: Payer: Self-pay

## 2019-06-02 NOTE — Telephone Encounter (Signed)
Fax medical and ophthalmology clearance to her doctors

## 2019-06-02 NOTE — Telephone Encounter (Signed)
Spoke with pharmacist regarding Tramadol and Senna pre-op prescriptions.  Verified Supervising physicians: Dr. Terrence Dupont Rossi's address, phone number and NPI number.  Information noted by pharmacist and will be sent out as requested.  Prescriptions to be sent within 7 days.

## 2019-06-03 NOTE — Telephone Encounter (Signed)
Received clearance from ophthalmology, copy fax to pre op and copy in chart

## 2019-06-13 DIAGNOSIS — Z23 Encounter for immunization: Secondary | ICD-10-CM | POA: Diagnosis not present

## 2019-06-15 DIAGNOSIS — C801 Malignant (primary) neoplasm, unspecified: Secondary | ICD-10-CM

## 2019-06-15 HISTORY — DX: Malignant (primary) neoplasm, unspecified: C80.1

## 2019-06-16 DIAGNOSIS — H25011 Cortical age-related cataract, right eye: Secondary | ICD-10-CM | POA: Diagnosis not present

## 2019-06-16 DIAGNOSIS — H2511 Age-related nuclear cataract, right eye: Secondary | ICD-10-CM | POA: Diagnosis not present

## 2019-06-16 DIAGNOSIS — H52221 Regular astigmatism, right eye: Secondary | ICD-10-CM | POA: Diagnosis not present

## 2019-06-16 DIAGNOSIS — H25811 Combined forms of age-related cataract, right eye: Secondary | ICD-10-CM | POA: Diagnosis not present

## 2019-06-22 NOTE — Patient Instructions (Signed)
DUE TO COVID-19 ONLY ONE VISITOR IS ALLOWED TO COME WITH YOU AND STAY IN THE WAITING ROOM ONLY DURING PRE OP AND PROCEDURE DAY OF SURGERY. THE 1 VISITOR MAY VISIT WITH YOU AFTER SURGERY IN YOUR PRIVATE ROOM DURING VISITING HOURS ONLY!  YOU NEED TO HAVE A COVID 19 TEST ON__3/12_____ @_9 :45______, THIS TEST MUST BE DONE BEFORE SURGERY, COME  [address] [place] , [id number].  ([place]) ONCE YOUR COVID TEST IS COMPLETED, PLEASE BEGIN THE QUARANTINE INSTRUCTIONS AS OUTLINED IN YOUR HANDOUT.                [place]    Your procedure is scheduled on: 06/30/19   Report to [facility] Main  Entrance   Report to Short Stay at 5:30 AM     Call this number if you have problems the morning of surgery [phone]    Remember: Do not eat food or drink liquids :After Midnight.   BRUSH YOUR TEETH MORNING OF SURGERY AND RINSE YOUR MOUTH OUT, NO CHEWING GUM CANDY OR MINTS.     Take these medicines the morning of surgery with A SIP OF WATER:  Use your eye drops  DO NOT TAKE ANY DIABETIC MEDICATIONS DAY OF YOUR SURGERY                               You may not have any metal on your body including hair pins and              piercings  Do not wear jewelry, make-up, lotions, powders or perfumes, deodorant             Do not wear nail polish on your fingernails.  Do not shave  48 hours prior to surgery.               Do not bring valuables to the hospital. [place].  Contacts, dentures or bridgework may not be worn into surgery.        Patients discharged the day of surgery will not be allowed to drive home.   IF YOU ARE HAVING SURGERY AND GOING HOME THE SAME DAY, YOU MUST HAVE AN ADULT TO DRIVE YOU HOME AND BE WITH YOU FOR 24 HOURS.  YOU MAY GO HOME BY TAXI OR UBER OR ORTHERWISE, BUT AN ADULT MUST ACCOMPANY YOU HOME AND STAY WITH YOU FOR 24 HOURS.  Name and phone number of your driver:  Special  Instructions: N/A              Please read over the following fact sheets you were given: _____________________________________________________________________             [facility] - Preparing for Surgery Before surgery, you can play an important role.   Because skin is not sterile, your skin needs to be as free of germs as possible.   You can reduce the number of germs on your skin by washing with CHG (chlorahexidine gluconate) soap before surgery.   CHG is an antiseptic cleaner which kills germs and bonds with the skin to continue killing germs even after washing. Please DO NOT use if you have an allergy to CHG or antibacterial soaps.   If your skin becomes reddened/irritated stop using the CHG and inform your nurse when you arrive at  Short Stay. Do not shave (including legs and underarms) for at least 48 hours prior to the first CHG shower.    Please follow these instructions carefully:  1.  Shower with CHG Soap the night before surgery and the  morning of Surgery.  2.  If you choose to wash your hair, wash your hair first as usual with your  normal  shampoo.  3.  After you shampoo, rinse your hair and body thoroughly to remove the  shampoo.                                        4.  Use CHG as you would any other liquid soap.  You can apply chg directly  to the skin and wash                       Gently with a scrungie or clean washcloth.  5.  Apply the CHG Soap to your body ONLY FROM THE NECK DOWN.   Do not use on face/ open                           Wound or open sores. Avoid contact with eyes, ears mouth and genitals (private parts).                       Wash face,  Genitals (private parts) with your normal soap.             6.  Wash thoroughly, paying special attention to the area where your surgery  will be performed.  7.  Thoroughly rinse your body with warm water from the neck down.  8.  DO NOT shower/wash with your normal soap after using and rinsing off  the CHG Soap.              9.  Pat yourself dry with a clean towel.            10.  Wear clean pajamas.            11.  Place clean sheets on your bed the night of your first shower and do not  sleep with pets. Day of Surgery : Do not apply any lotions/deodorants the morning of surgery.  Please wear clean clothes to the hospital/surgery center.  FAILURE TO FOLLOW THESE INSTRUCTIONS MAY RESULT IN THE CANCELLATION OF YOUR SURGERY PATIENT SIGNATURE_________________________________  NURSE SIGNATURE__________________________________  ________________________________________________________________________   Heidi Avila  An incentive spirometer is a tool that can help keep your lungs clear and active. This tool measures how well you are filling your lungs with each breath. Taking long deep breaths may help reverse or decrease the chance of developing breathing (pulmonary) problems (especially infection) following:  A long period of time when you are unable to move or be active. BEFORE THE PROCEDURE   If the spirometer includes an indicator to show your best effort, your nurse or respiratory therapist will set it to a desired goal.  If possible, sit up straight or lean slightly forward. Try not to slouch.  Hold the incentive spirometer in an upright position. INSTRUCTIONS FOR USE  1. Sit on the edge of your bed if possible, or sit up as far as you can in bed or on a chair. 2. Hold the incentive spirometer in an upright position.  3. Breathe out normally. 4. Place the mouthpiece in your mouth and seal your lips tightly around it. 5. Breathe in slowly and as deeply as possible, raising the piston or the ball toward the top of the column. 6. Hold your breath for 3-5 seconds or for as long as possible. Allow the piston or ball to fall to the bottom of the column. 7. Remove the mouthpiece from your mouth and breathe out normally. 8. Rest for a few seconds and repeat Steps 1 through 7 at least 10 times  every 1-2 hours when you are awake. Take your time and take a few normal breaths between deep breaths. 9. The spirometer may include an indicator to show your best effort. Use the indicator as a goal to work toward during each repetition. 10. After each set of 10 deep breaths, practice coughing to be sure your lungs are clear. If you have an incision (the cut made at the time of surgery), support your incision when coughing by placing a pillow or rolled up towels firmly against it. Once you are able to get out of bed, walk around indoors and cough well. You may stop using the incentive spirometer when instructed by your caregiver.  RISKS AND COMPLICATIONS  Take your time so you do not get dizzy or light-headed.  If you are in pain, you may need to take or ask for pain medication before doing incentive spirometry. It is harder to take a deep breath if you are having pain. AFTER USE  Rest and breathe slowly and easily.  It can be helpful to keep track of a log of your progress. Your caregiver can provide you with a simple table to help with this. If you are using the spirometer at home, follow these instructions: [place] IF:   You are having difficultly using the spirometer.  You have trouble using the spirometer as often as instructed.  Your pain medication is not giving enough relief while using the spirometer.  You develop fever of 100.5 F (38.1 C) or higher. SEEK IMMEDIATE MEDICAL CARE IF:   You cough up bloody sputum that had not been present before.  You develop fever of 102 F (38.9 C) or greater.  You develop worsening pain at or near the incision site. MAKE SURE YOU:   Understand these instructions.  Will watch your condition.  Will get help right away if you are not doing well or get worse. Document Released: 08/13/2006 Document Revised: 06/25/2011 Document Reviewed: 10/14/2006 ExitCare Patient Information 2014 ExitCare, [place].   ___ WHAT IS A BLOOD  TRANSFUSION? Blood Transfusion Information  A transfusion is the replacement of blood or some of its parts. Blood is made up of multiple cells which provide different functions.  Red blood cells carry oxygen and are used for blood loss replacement.  White blood cells fight against infection.  Platelets control bleeding.  Plasma helps clot blood.  Other blood products are available for specialized needs, such as hemophilia or other clotting disorders. BEFORE THE TRANSFUSION  Who gives blood for transfusions?   Healthy volunteers who are fully evaluated to make sure their blood is safe. This is blood bank blood. Transfusion therapy is the safest it has ever been in the practice of medicine. Before blood is taken from a donor, a complete history is taken to make sure that person has no history of diseases nor engages in risky social behavior (examples are intravenous drug use or sexual activity with multiple partners). The  donor's travel history is screened to minimize risk of transmitting infections, such as malaria. The donated blood is tested for signs of infectious diseases, such as HIV and hepatitis. The blood is then tested to be sure it is compatible with you in order to minimize the chance of a transfusion reaction. If you or a relative donates blood, this is often done in anticipation of surgery and is not appropriate for emergency situations. It takes many days to process the donated blood. RISKS AND COMPLICATIONS Although transfusion therapy is very safe and saves many lives, the main dangers of transfusion include:   Getting an infectious disease.  Developing a transfusion reaction. This is an allergic reaction to something in the blood you were given. Every precaution is taken to prevent this. The decision to have a blood transfusion has been considered carefully by your caregiver before blood is given. Blood is not given unless the benefits outweigh the risks. AFTER THE  TRANSFUSION  Right after receiving a blood transfusion, you will usually feel much better and more energetic. This is especially true if your red blood cells have gotten low (anemic). The transfusion raises the level of the red blood cells which carry oxygen, and this usually causes an energy increase.  The nurse administering the transfusion will monitor you carefully for complications. HOME CARE INSTRUCTIONS  No special instructions are needed after a transfusion. You may find your energy is better. Speak with your caregiver about any limitations on activity for underlying diseases you may have. SEEK MEDICAL CARE IF:   Your condition is not improving after your transfusion.  You develop redness or irritation at the intravenous (IV) site. SEEK IMMEDIATE MEDICAL CARE IF:  Any of the following symptoms occur over the next 12 hours:  Shaking chills.  You have a temperature by mouth above 102 F (38.9 C), not controlled by medicine.  Chest, back, or muscle pain.  People around you feel you are not acting correctly or are confused.  Shortness of breath or difficulty breathing.  Dizziness and fainting.  You get a rash or develop hives.  You have a decrease in urine output.  Your urine turns a dark color or changes to pink, red, or brown. Any of the following symptoms occur over the next 10 days:  You have a temperature by mouth above 102 F (38.9 C), not controlled by medicine.  Shortness of breath.  Weakness after normal activity.  The white part of the eye turns yellow (jaundice).  You have a decrease in the amount of urine or are urinating less often.  Your urine turns a dark color or changes to pink, red, or brown. Document Released: 03/30/2000 Document Revised: 06/25/2011 Document Reviewed: 11/17/2007 [facility] Patient Information 2014 [place],  [place].  ____________________________________________________________________________________________________________________________________________

## 2019-06-24 ENCOUNTER — Other Ambulatory Visit: Payer: Self-pay

## 2019-06-24 ENCOUNTER — Encounter (HOSPITAL_COMMUNITY)
Admission: RE | Admit: 2019-06-24 | Discharge: 2019-06-24 | Disposition: A | Discharge status: Home / Self Care | Payer: Medicare Other | Source: Ambulatory Visit | Attending: Gynecologic Oncology | Admitting: Gynecologic Oncology

## 2019-06-24 ENCOUNTER — Encounter (HOSPITAL_COMMUNITY): Payer: Self-pay

## 2019-06-24 DIAGNOSIS — Z01818 Encounter for other preprocedural examination: Secondary | ICD-10-CM | POA: Diagnosis not present

## 2019-06-24 NOTE — Progress Notes (Signed)
PCP - [place] , [facility] medical Cardiologist - no  Chest x-ray - 06/25/19 EKG -06/25/19 Stress Test - no ECHO - no Cardiac Cath - no  Sleep Study - no CPAP -   Fasting Blood Sugar - NA Checks Blood Sugar _____ times a day  Blood Thinner Instructions:NA Aspirin Instructions: Last Dose:  Anesthesia review:   Patient denies shortness of breath, fever, cough and chest pain at PAT appointment yes  Patient verbalized understanding of instructions that were given to them at the PAT appointment. Patient was also instructed that they will need to review over the PAT instructions again at home before surgery. yes

## 2019-06-25 ENCOUNTER — Other Ambulatory Visit: Payer: Self-pay

## 2019-06-25 ENCOUNTER — Ambulatory Visit (HOSPITAL_COMMUNITY)
Admission: RE | Admit: 2019-06-25 | Discharge: 2019-06-25 | Disposition: A | Discharge status: Home / Self Care | Payer: Medicare Other | Source: Ambulatory Visit | Attending: Gynecologic Oncology | Admitting: Gynecologic Oncology

## 2019-06-25 ENCOUNTER — Encounter (HOSPITAL_COMMUNITY)
Admission: RE | Admit: 2019-06-25 | Discharge: 2019-06-25 | Disposition: A | Discharge status: Home / Self Care | Payer: Medicare Other | Source: Ambulatory Visit | Attending: Gynecologic Oncology | Admitting: Gynecologic Oncology

## 2019-06-25 DIAGNOSIS — C541 Malignant neoplasm of endometrium: Secondary | ICD-10-CM | POA: Insufficient documentation

## 2019-06-25 DIAGNOSIS — Z01818 Encounter for other preprocedural examination: Secondary | ICD-10-CM | POA: Diagnosis not present

## 2019-06-25 LAB — URINALYSIS, ROUTINE W REFLEX MICROSCOPIC
Bilirubin Urine: NEGATIVE
Glucose, UA: NEGATIVE mg/dL
Ketones, ur: NEGATIVE mg/dL
Leukocytes,Ua: NEGATIVE
Nitrite: NEGATIVE
Protein, ur: NEGATIVE mg/dL
Specific Gravity, Urine: 1.016 (ref 1.005–1.030)
pH: 7 (ref 5.0–8.0)

## 2019-06-25 LAB — CBC
HCT: 40 % (ref 36.0–46.0)
Hemoglobin: 12.8 g/dL (ref 12.0–15.0)
MCH: 29.2 pg (ref 26.0–34.0)
MCHC: 32 g/dL (ref 30.0–36.0)
MCV: 91.1 fL (ref 80.0–100.0)
Platelets: 303 10*3/uL (ref 150–400)
RBC: 4.39 MIL/uL (ref 3.87–5.11)
RDW: 13.4 % (ref 11.5–15.5)
WBC: 8.9 10*3/uL (ref 4.0–10.5)
nRBC: 0 % (ref 0.0–0.2)

## 2019-06-25 LAB — COMPREHENSIVE METABOLIC PANEL
ALT: 16 U/L (ref 0–44)
AST: 17 U/L (ref 15–41)
Albumin: 4.1 g/dL (ref 3.5–5.0)
Alkaline Phosphatase: 30 U/L — ABNORMAL LOW (ref 38–126)
Anion gap: 8 (ref 5–15)
BUN: 18 mg/dL (ref 8–23)
CO2: 26 mmol/L (ref 22–32)
Calcium: 9.4 mg/dL (ref 8.9–10.3)
Chloride: 105 mmol/L (ref 98–111)
Creatinine, Ser: 1.18 mg/dL — ABNORMAL HIGH (ref 0.44–1.00)
GFR calc Af Amer: 53 mL/min — ABNORMAL LOW (ref >60)
GFR calc non Af Amer: 46 mL/min — ABNORMAL LOW (ref >60)
Glucose, Bld: 129 mg/dL — ABNORMAL HIGH (ref 70–99)
Potassium: 3.6 mmol/L (ref 3.5–5.1)
Sodium: 139 mmol/L (ref 135–145)
Total Bilirubin: 0.7 mg/dL (ref 0.3–1.2)
Total Protein: 6.9 g/dL (ref 6.5–8.1)

## 2019-06-26 ENCOUNTER — Other Ambulatory Visit (HOSPITAL_COMMUNITY)
Admission: RE | Admit: 2019-06-26 | Discharge: 2019-06-26 | Disposition: A | Discharge status: Home / Self Care | Payer: Medicare Other | Source: Ambulatory Visit | Attending: Gynecologic Oncology | Admitting: Gynecologic Oncology

## 2019-06-26 DIAGNOSIS — Z01812 Encounter for preprocedural laboratory examination: Secondary | ICD-10-CM | POA: Diagnosis not present

## 2019-06-26 DIAGNOSIS — Z20822 Contact with and (suspected) exposure to covid-19: Secondary | ICD-10-CM | POA: Insufficient documentation

## 2019-06-26 LAB — ABO/RH: ABO/RH(D): A POS

## 2019-06-26 LAB — SARS CORONAVIRUS 2 (TAT 6-24 HRS): SARS Coronavirus 2: NEGATIVE

## 2019-06-29 ENCOUNTER — Telehealth: Payer: Self-pay

## 2019-06-29 NOTE — Anesthesia Preprocedure Evaluation (Addendum)
Anesthesia Evaluation  Patient identified by MRN, date of birth, ID band Patient awake    Reviewed: Allergy & Precautions, H&P , NPO status , Patient's Chart, lab work & pertinent test results  Airway Mallampati: III  TM Distance: >3 FB Neck ROM: Full    Dental no notable dental hx. (+) Teeth Intact, Dental Advisory Given   Pulmonary neg pulmonary ROS,    Pulmonary exam normal breath sounds clear to auscultation       Cardiovascular hypertension, Pt. on medications  Rhythm:Regular Rate:Normal     Neuro/Psych negative neurological ROS  negative psych ROS   GI/Hepatic Neg liver ROS, GERD  Medicated,  Endo/Other  Morbid obesity  Renal/GU negative Renal ROS  negative genitourinary   Musculoskeletal   Abdominal   Peds  Hematology negative hematology ROS (+)   Anesthesia Other Findings   Reproductive/Obstetrics negative OB ROS                            Anesthesia Physical Anesthesia Plan  ASA: III  Anesthesia Plan: General   Post-op Pain Management:    Induction: Intravenous  PONV Risk Score and Plan: 4 or greater and Ondansetron, Dexamethasone and Midazolam  Airway Management Planned: Oral ETT  Additional Equipment:   Intra-op Plan:   Post-operative Plan: Extubation in OR  Informed Consent: I have reviewed the patients History and Physical, chart, labs and discussed the procedure including the risks, benefits and alternatives for the proposed anesthesia with the patient or authorized representative who has indicated his/her understanding and acceptance.     Dental advisory given  Plan Discussed with: CRNA  Anesthesia Plan Comments:         Anesthesia Quick Evaluation

## 2019-06-29 NOTE — Telephone Encounter (Signed)
Reviewed pre-op instructions with noted 06-25-19 by Ned Card with Ms [facility].  Pt verbalized understanding.

## 2019-06-30 ENCOUNTER — Encounter (HOSPITAL_COMMUNITY): Payer: Self-pay | Admitting: Gynecologic Oncology

## 2019-06-30 ENCOUNTER — Other Ambulatory Visit: Payer: Self-pay

## 2019-06-30 ENCOUNTER — Inpatient Hospital Stay (HOSPITAL_COMMUNITY)
Admission: RE | Admit: 2019-06-30 | Discharge: 2019-07-02 | DRG: 674 | Disposition: A | Discharge status: Home / Self Care | Payer: Medicare Other | Attending: Gynecologic Oncology | Admitting: Gynecologic Oncology

## 2019-06-30 ENCOUNTER — Ambulatory Visit (HOSPITAL_COMMUNITY): Payer: Medicare Other | Admitting: Anesthesiology

## 2019-06-30 ENCOUNTER — Ambulatory Visit (HOSPITAL_COMMUNITY): Payer: Medicare Other | Admitting: Physician Assistant

## 2019-06-30 ENCOUNTER — Encounter (HOSPITAL_COMMUNITY)
Admission: RE | Disposition: A | Discharge status: Home / Self Care | Payer: Self-pay | Source: Home / Self Care | Attending: Gynecologic Oncology

## 2019-06-30 DIAGNOSIS — N950 Postmenopausal bleeding: Secondary | ICD-10-CM | POA: Diagnosis present

## 2019-06-30 DIAGNOSIS — K219 Gastro-esophageal reflux disease without esophagitis: Secondary | ICD-10-CM | POA: Diagnosis not present

## 2019-06-30 DIAGNOSIS — Z823 Family history of stroke: Secondary | ICD-10-CM

## 2019-06-30 DIAGNOSIS — C541 Malignant neoplasm of endometrium: Secondary | ICD-10-CM | POA: Diagnosis present

## 2019-06-30 DIAGNOSIS — E65 Localized adiposity: Secondary | ICD-10-CM | POA: Diagnosis not present

## 2019-06-30 DIAGNOSIS — Z79890 Hormone replacement therapy (postmenopausal): Secondary | ICD-10-CM

## 2019-06-30 DIAGNOSIS — I959 Hypotension, unspecified: Secondary | ICD-10-CM | POA: Diagnosis present

## 2019-06-30 DIAGNOSIS — N179 Acute kidney failure, unspecified: Secondary | ICD-10-CM | POA: Diagnosis not present

## 2019-06-30 DIAGNOSIS — Z96642 Presence of left artificial hip joint: Secondary | ICD-10-CM | POA: Diagnosis not present

## 2019-06-30 DIAGNOSIS — I129 Hypertensive chronic kidney disease with stage 1 through stage 4 chronic kidney disease, or unspecified chronic kidney disease: Secondary | ICD-10-CM | POA: Diagnosis present

## 2019-06-30 DIAGNOSIS — Z800 Family history of malignant neoplasm of digestive organs: Secondary | ICD-10-CM

## 2019-06-30 DIAGNOSIS — N189 Chronic kidney disease, unspecified: Secondary | ICD-10-CM

## 2019-06-30 DIAGNOSIS — C775 Secondary and unspecified malignant neoplasm of intrapelvic lymph nodes: Secondary | ICD-10-CM | POA: Diagnosis not present

## 2019-06-30 DIAGNOSIS — Z791 Long term (current) use of non-steroidal anti-inflammatories (NSAID): Secondary | ICD-10-CM

## 2019-06-30 DIAGNOSIS — D252 Subserosal leiomyoma of uterus: Secondary | ICD-10-CM | POA: Diagnosis not present

## 2019-06-30 DIAGNOSIS — Z6841 Body Mass Index (BMI) 40.0 and over, adult: Secondary | ICD-10-CM | POA: Diagnosis not present

## 2019-06-30 DIAGNOSIS — Z79899 Other long term (current) drug therapy: Secondary | ICD-10-CM

## 2019-06-30 DIAGNOSIS — N8502 Endometrial intraepithelial neoplasia [EIN]: Secondary | ICD-10-CM

## 2019-06-30 DIAGNOSIS — E861 Hypovolemia: Secondary | ICD-10-CM | POA: Diagnosis not present

## 2019-06-30 DIAGNOSIS — N8501 Benign endometrial hyperplasia: Secondary | ICD-10-CM | POA: Diagnosis not present

## 2019-06-30 DIAGNOSIS — I10 Essential (primary) hypertension: Secondary | ICD-10-CM

## 2019-06-30 DIAGNOSIS — Z801 Family history of malignant neoplasm of trachea, bronchus and lung: Secondary | ICD-10-CM

## 2019-06-30 HISTORY — PX: ROBOTIC ASSISTED TOTAL HYSTERECTOMY WITH BILATERAL SALPINGO OOPHERECTOMY: SHX6086

## 2019-06-30 HISTORY — PX: SENTINEL NODE BIOPSY: SHX6608

## 2019-06-30 LAB — TYPE AND SCREEN
ABO/RH(D): A POS
Antibody Screen: NEGATIVE

## 2019-06-30 SURGERY — HYSTERECTOMY, TOTAL, ROBOT-ASSISTED, LAPAROSCOPIC, WITH BILATERAL SALPINGO-OOPHORECTOMY
Anesthesia: General | Site: Abdomen

## 2019-06-30 MED ORDER — GABAPENTIN 300 MG PO CAPS
300.0000 mg | ORAL_CAPSULE | Freq: Three times a day (TID) | ORAL | Status: DC
  Administered 2019-06-30 – 2019-07-02 (×5): 300 mg via ORAL
  Filled 2019-06-30 (×5): qty 1

## 2019-06-30 MED ORDER — HYDROMORPHONE HCL 1 MG/ML IJ SOLN
0.2500 mg | INTRAMUSCULAR | Status: DC | PRN
  Administered 2019-06-30: 12:00:00 0.25 mg via INTRAVENOUS

## 2019-06-30 MED ORDER — HYDROMORPHONE HCL 1 MG/ML IJ SOLN: 0.5000 mg | INTRAMUSCULAR | Status: DC | PRN

## 2019-06-30 MED ORDER — ROCURONIUM BROMIDE 10 MG/ML (PF) SYRINGE
PREFILLED_SYRINGE | INTRAVENOUS | Status: DC | PRN
  Administered 2019-06-30 (×2): 10 mg via INTRAVENOUS
  Administered 2019-06-30: 50 mg via INTRAVENOUS
  Administered 2019-06-30: 20 mg via INTRAVENOUS

## 2019-06-30 MED ORDER — SUGAMMADEX SODIUM 500 MG/5ML IV SOLN
INTRAVENOUS | Status: AC
  Filled 2019-06-30: qty 5

## 2019-06-30 MED ORDER — PROPOFOL 10 MG/ML IV BOLUS
INTRAVENOUS | Status: AC
  Filled 2019-06-30: qty 20

## 2019-06-30 MED ORDER — ONDANSETRON HCL 4 MG/2ML IJ SOLN
INTRAMUSCULAR | Status: AC
  Filled 2019-06-30: qty 2

## 2019-06-30 MED ORDER — DEXAMETHASONE SODIUM PHOSPHATE 10 MG/ML IJ SOLN
INTRAMUSCULAR | Status: AC
  Filled 2019-06-30: qty 1

## 2019-06-30 MED ORDER — FENTANYL CITRATE (PF) 100 MCG/2ML IJ SOLN
INTRAMUSCULAR | Status: AC
  Filled 2019-06-30: qty 2

## 2019-06-30 MED ORDER — GABAPENTIN 300 MG PO CAPS
300.0000 mg | ORAL_CAPSULE | ORAL | Status: DC
  Filled 2019-06-30: qty 1

## 2019-06-30 MED ORDER — DEXAMETHASONE SODIUM PHOSPHATE 10 MG/ML IJ SOLN
INTRAMUSCULAR | Status: DC | PRN
  Administered 2019-06-30: 8 mg via INTRAVENOUS

## 2019-06-30 MED ORDER — STERILE WATER FOR IRRIGATION IR SOLN
Status: DC | PRN
  Administered 2019-06-30: 1000 mL

## 2019-06-30 MED ORDER — KETAMINE HCL 10 MG/ML IJ SOLN
INTRAMUSCULAR | Status: DC | PRN
  Administered 2019-06-30: 10 mg via INTRAVENOUS
  Administered 2019-06-30: 15 mg via INTRAVENOUS

## 2019-06-30 MED ORDER — ONDANSETRON HCL 4 MG PO TABS: 4.0000 mg | ORAL_TABLET | Freq: Four times a day (QID) | ORAL | Status: DC | PRN

## 2019-06-30 MED ORDER — TRAMADOL HCL 50 MG PO TABS
100.0000 mg | ORAL_TABLET | Freq: Two times a day (BID) | ORAL | Status: DC | PRN
  Administered 2019-06-30 – 2019-07-01 (×3): 100 mg via ORAL
  Filled 2019-06-30 (×3): qty 2

## 2019-06-30 MED ORDER — BENAZEPRIL HCL 10 MG PO TABS
20.0000 mg | ORAL_TABLET | Freq: Every evening | ORAL | Status: DC
  Administered 2019-06-30: 20 mg via ORAL
  Filled 2019-06-30: qty 2

## 2019-06-30 MED ORDER — LACTATED RINGERS IV SOLN: INTRAVENOUS | Status: DC

## 2019-06-30 MED ORDER — PROPOFOL 10 MG/ML IV BOLUS
INTRAVENOUS | Status: DC | PRN
  Administered 2019-06-30: 120 mg via INTRAVENOUS

## 2019-06-30 MED ORDER — HYDROMORPHONE HCL 1 MG/ML IJ SOLN
INTRAMUSCULAR | Status: AC
  Administered 2019-06-30: 0.25 mg via INTRAVENOUS
  Filled 2019-06-30: qty 1

## 2019-06-30 MED ORDER — ENOXAPARIN SODIUM 40 MG/0.4ML ~~LOC~~ SOLN: 40.0000 mg | SUBCUTANEOUS | Status: DC

## 2019-06-30 MED ORDER — OXYCODONE HCL 5 MG PO TABS
5.0000 mg | ORAL_TABLET | ORAL | Status: DC | PRN
  Administered 2019-06-30: 16:00:00 5 mg via ORAL
  Filled 2019-06-30: qty 1

## 2019-06-30 MED ORDER — ACETAMINOPHEN 500 MG PO TABS
1000.0000 mg | ORAL_TABLET | ORAL | Status: AC
  Administered 2019-06-30: 1000 mg via ORAL
  Filled 2019-06-30: qty 2

## 2019-06-30 MED ORDER — HYDRALAZINE HCL 20 MG/ML IJ SOLN
5.0000 mg | Freq: Once | INTRAMUSCULAR | Status: AC
  Administered 2019-06-30: 5 mg via INTRAVENOUS

## 2019-06-30 MED ORDER — PHENYLEPHRINE 40 MCG/ML (10ML) SYRINGE FOR IV PUSH (FOR BLOOD PRESSURE SUPPORT)
PREFILLED_SYRINGE | INTRAVENOUS | Status: DC | PRN
  Administered 2019-06-30: 120 ug via INTRAVENOUS

## 2019-06-30 MED ORDER — SODIUM CHLORIDE 0.9% FLUSH: 3.0000 mL | Freq: Two times a day (BID) | INTRAVENOUS | Status: DC

## 2019-06-30 MED ORDER — STERILE WATER FOR INJECTION IJ SOLN
INTRAMUSCULAR | Status: AC
  Filled 2019-06-30: qty 10

## 2019-06-30 MED ORDER — SUGAMMADEX SODIUM 200 MG/2ML IV SOLN
INTRAVENOUS | Status: DC | PRN
  Administered 2019-06-30: 220 mg via INTRAVENOUS

## 2019-06-30 MED ORDER — BUPIVACAINE HCL 0.25 % IJ SOLN
INTRAMUSCULAR | Status: DC | PRN
  Administered 2019-06-30: 38 mL

## 2019-06-30 MED ORDER — CLINDAMYCIN PHOSPHATE 900 MG/50ML IV SOLN
900.0000 mg | INTRAVENOUS | Status: AC
  Administered 2019-06-30: 900 mg via INTRAVENOUS
  Filled 2019-06-30: qty 50

## 2019-06-30 MED ORDER — BENAZEPRIL-HYDROCHLOROTHIAZIDE 20-25 MG PO TABS: 1.0000 | ORAL_TABLET | Freq: Every evening | ORAL | Status: DC

## 2019-06-30 MED ORDER — STERILE WATER FOR INJECTION IJ SOLN
INTRAMUSCULAR | Status: DC | PRN
  Administered 2019-06-30: 4 mL

## 2019-06-30 MED ORDER — HEPARIN SODIUM (PORCINE) 5000 UNIT/ML IJ SOLN
5000.0000 [IU] | INTRAMUSCULAR | Status: AC
  Administered 2019-06-30: 07:00:00 5000 [IU] via SUBCUTANEOUS
  Filled 2019-06-30: qty 1

## 2019-06-30 MED ORDER — KCL IN DEXTROSE-NACL 20-5-0.45 MEQ/L-%-% IV SOLN
INTRAVENOUS | Status: DC
  Filled 2019-06-30 (×3): qty 1000

## 2019-06-30 MED ORDER — HYDROCHLOROTHIAZIDE 25 MG PO TABS
25.0000 mg | ORAL_TABLET | Freq: Every evening | ORAL | Status: DC
  Administered 2019-06-30: 25 mg via ORAL
  Filled 2019-06-30: qty 1

## 2019-06-30 MED ORDER — ONDANSETRON HCL 4 MG/2ML IJ SOLN: 4.0000 mg | Freq: Four times a day (QID) | INTRAMUSCULAR | Status: DC | PRN

## 2019-06-30 MED ORDER — ONDANSETRON HCL 4 MG/2ML IJ SOLN
INTRAMUSCULAR | Status: DC | PRN
  Administered 2019-06-30: 4 mg via INTRAVENOUS

## 2019-06-30 MED ORDER — DEXAMETHASONE SODIUM PHOSPHATE 4 MG/ML IJ SOLN: 4.0000 mg | INTRAMUSCULAR | Status: DC

## 2019-06-30 MED ORDER — HYDRALAZINE HCL 20 MG/ML IJ SOLN
INTRAMUSCULAR | Status: AC
  Filled 2019-06-30: qty 1

## 2019-06-30 MED ORDER — KETAMINE HCL 10 MG/ML IJ SOLN
INTRAMUSCULAR | Status: AC
  Filled 2019-06-30: qty 1

## 2019-06-30 MED ORDER — LACTATED RINGERS IR SOLN
Status: DC | PRN
  Administered 2019-06-30: 1000 mL

## 2019-06-30 MED ORDER — MIDAZOLAM HCL 2 MG/2ML IJ SOLN
INTRAMUSCULAR | Status: AC
  Filled 2019-06-30: qty 2

## 2019-06-30 MED ORDER — LIDOCAINE HCL 2 % IJ SOLN
INTRAMUSCULAR | Status: AC
  Filled 2019-06-30: qty 20

## 2019-06-30 MED ORDER — BUPIVACAINE HCL 0.25 % IJ SOLN
INTRAMUSCULAR | Status: AC
  Filled 2019-06-30: qty 1

## 2019-06-30 MED ORDER — SENNOSIDES-DOCUSATE SODIUM 8.6-50 MG PO TABS
2.0000 | ORAL_TABLET | Freq: Every day | ORAL | Status: DC
  Administered 2019-06-30 – 2019-07-01 (×2): 2 via ORAL
  Filled 2019-06-30 (×2): qty 2

## 2019-06-30 MED ORDER — MIDAZOLAM HCL 2 MG/2ML IJ SOLN
INTRAMUSCULAR | Status: DC | PRN
  Administered 2019-06-30: 2 mg via INTRAVENOUS

## 2019-06-30 MED ORDER — LIDOCAINE 2% (20 MG/ML) 5 ML SYRINGE
INTRAMUSCULAR | Status: DC | PRN
  Administered 2019-06-30: 50 mg via INTRAVENOUS

## 2019-06-30 MED ORDER — ROCURONIUM BROMIDE 10 MG/ML (PF) SYRINGE
PREFILLED_SYRINGE | INTRAVENOUS | Status: AC
  Filled 2019-06-30: qty 10

## 2019-06-30 MED ORDER — FENTANYL CITRATE (PF) 100 MCG/2ML IJ SOLN
INTRAMUSCULAR | Status: DC | PRN
  Administered 2019-06-30 (×3): 50 ug via INTRAVENOUS

## 2019-06-30 MED ORDER — IBUPROFEN 400 MG PO TABS
600.0000 mg | ORAL_TABLET | Freq: Four times a day (QID) | ORAL | Status: DC | PRN
  Filled 2019-06-30: qty 1

## 2019-06-30 MED ORDER — LIDOCAINE 2% (20 MG/ML) 5 ML SYRINGE
INTRAMUSCULAR | Status: AC
  Filled 2019-06-30: qty 5

## 2019-06-30 MED ORDER — ACETAMINOPHEN 500 MG PO TABS
1000.0000 mg | ORAL_TABLET | Freq: Two times a day (BID) | ORAL | Status: DC
  Administered 2019-06-30 – 2019-07-01 (×2): 1000 mg via ORAL
  Filled 2019-06-30 (×3): qty 2

## 2019-06-30 MED ORDER — LIDOCAINE 2% (20 MG/ML) 5 ML SYRINGE
INTRAMUSCULAR | Status: DC | PRN
  Administered 2019-06-30: 1.5 mg/kg/h via INTRAVENOUS

## 2019-06-30 MED ORDER — GABAPENTIN 600 MG PO TABS
300.0000 mg | ORAL_TABLET | Freq: Three times a day (TID) | ORAL | Status: DC
  Filled 2019-06-30 (×2): qty 0.5

## 2019-06-30 MED ORDER — CIPROFLOXACIN IN D5W 400 MG/200ML IV SOLN
400.0000 mg | INTRAVENOUS | Status: AC
  Administered 2019-06-30: 400 mg via INTRAVENOUS
  Filled 2019-06-30: qty 200

## 2019-06-30 MED ORDER — PHENYLEPHRINE 40 MCG/ML (10ML) SYRINGE FOR IV PUSH (FOR BLOOD PRESSURE SUPPORT)
PREFILLED_SYRINGE | INTRAVENOUS | Status: AC
  Filled 2019-06-30: qty 10

## 2019-06-30 SURGICAL SUPPLY — 78 items
ADH SKN CLS APL DERMABOND .7 (GAUZE/BANDAGES/DRESSINGS) ×2
AGENT HMST KT MTR STRL THRMB (HEMOSTASIS) ×4
APL ESCP 34 STRL LF DISP (HEMOSTASIS) ×2
APPLICATOR SURGIFLO ENDO (HEMOSTASIS) ×2 IMPLANT
BACTOSHIELD CHG 4% 4OZ (MISCELLANEOUS) ×2
BAG LAPAROSCOPIC 12 15 PORT 16 (BASKET) IMPLANT
BAG RETRIEVAL 12/15 (BASKET)
BAG RETRIEVAL 12/15MM (BASKET)
BAG SPEC RTRVL LRG 6X4 10 (ENDOMECHANICALS) ×4
BLADE SURG SZ10 CARB STEEL (BLADE) IMPLANT
COVER BACK TABLE 60X90IN (DRAPES) ×4 IMPLANT
COVER TIP SHEARS 8 DVNC (MISCELLANEOUS) ×2 IMPLANT
COVER TIP SHEARS 8MM DA VINCI (MISCELLANEOUS) ×4
COVER WAND RF STERILE (DRAPES) IMPLANT
DECANTER SPIKE VIAL GLASS SM (MISCELLANEOUS) ×4 IMPLANT
DERMABOND ADVANCED (GAUZE/BANDAGES/DRESSINGS) ×2
DERMABOND ADVANCED .7 DNX12 (GAUZE/BANDAGES/DRESSINGS) ×2 IMPLANT
DRAPE ARM DVNC X/XI (DISPOSABLE) ×8 IMPLANT
DRAPE COLUMN DVNC XI (DISPOSABLE) ×2 IMPLANT
DRAPE DA VINCI XI ARM (DISPOSABLE) ×16
DRAPE DA VINCI XI COLUMN (DISPOSABLE) ×4
DRAPE SHEET LG 3/4 BI-LAMINATE (DRAPES) ×4 IMPLANT
DRAPE SURG IRRIG POUCH 19X23 (DRAPES) ×4 IMPLANT
DRSG OPSITE POSTOP 4X6 (GAUZE/BANDAGES/DRESSINGS) IMPLANT
DRSG OPSITE POSTOP 4X8 (GAUZE/BANDAGES/DRESSINGS) IMPLANT
DRSG TEGADERM 6X8 (GAUZE/BANDAGES/DRESSINGS) ×2 IMPLANT
ELECT REM PT RETURN 15FT ADLT (MISCELLANEOUS) ×4 IMPLANT
GAUZE 4X4 16PLY RFD (DISPOSABLE) ×2 IMPLANT
GLOVE BIOGEL PI IND STRL 6 (GLOVE) IMPLANT
GLOVE BIOGEL PI IND STRL 7.0 (GLOVE) IMPLANT
GLOVE BIOGEL PI INDICATOR 6 (GLOVE) ×4
GLOVE BIOGEL PI INDICATOR 7.0 (GLOVE) ×12
GLOVE SURG SS PI 6.0 STRL IVOR (GLOVE) ×8 IMPLANT
GLOVE SURG SS PI 6.5 STRL IVOR (GLOVE) ×4 IMPLANT
GOWN STRL REUS W/ TWL LRG LVL3 (GOWN DISPOSABLE) ×8 IMPLANT
GOWN STRL REUS W/TWL LRG LVL3 (GOWN DISPOSABLE) ×16
GOWN STRL REUS W/TWL XL LVL3 (GOWN DISPOSABLE) ×6 IMPLANT
HOLDER FOLEY CATH W/STRAP (MISCELLANEOUS) ×4 IMPLANT
IRRIG SUCT STRYKERFLOW 2 WTIP (MISCELLANEOUS) ×4
IRRIGATION SUCT STRKRFLW 2 WTP (MISCELLANEOUS) ×2 IMPLANT
KIT PROCEDURE DA VINCI SI (MISCELLANEOUS) ×4
KIT PROCEDURE DVNC SI (MISCELLANEOUS) IMPLANT
KIT TURNOVER KIT A (KITS) IMPLANT
MANIPULATOR UTERINE 4.5 ZUMI (MISCELLANEOUS) ×4 IMPLANT
NDL HYPO 21X1.5 SAFETY (NEEDLE) ×2 IMPLANT
NDL SPNL 18GX3.5 QUINCKE PK (NEEDLE) IMPLANT
NEEDLE HYPO 21X1.5 SAFETY (NEEDLE) ×4 IMPLANT
NEEDLE SPNL 18GX3.5 QUINCKE PK (NEEDLE) ×4 IMPLANT
OBTURATOR OPTICAL STANDARD 8MM (TROCAR) ×4
OBTURATOR OPTICAL STND 8 DVNC (TROCAR) ×2
OBTURATOR OPTICALSTD 8 DVNC (TROCAR) ×2 IMPLANT
PACK ROBOT GYN CUSTOM WL (TRAY / TRAY PROCEDURE) ×4 IMPLANT
PAD POSITIONING PINK XL (MISCELLANEOUS) ×4 IMPLANT
PENCIL SMOKE EVACUATOR (MISCELLANEOUS) IMPLANT
PORT ACCESS TROCAR AIRSEAL 12 (TROCAR) ×2 IMPLANT
PORT ACCESS TROCAR AIRSEAL 5M (TROCAR) ×2
POUCH SPECIMEN RETRIEVAL 10MM (ENDOMECHANICALS) ×4 IMPLANT
SCRUB CHG 4% DYNA-HEX 4OZ (MISCELLANEOUS) ×2 IMPLANT
SEAL CANN UNIV 5-8 DVNC XI (MISCELLANEOUS) ×6 IMPLANT
SEAL XI 5MM-8MM UNIVERSAL (MISCELLANEOUS) ×12
SET TRI-LUMEN FLTR TB AIRSEAL (TUBING) ×4 IMPLANT
SPONGE LAP 18X18 RF (DISPOSABLE) IMPLANT
SURGIFLO W/THROMBIN 8M KIT (HEMOSTASIS) ×4 IMPLANT
SUT MNCRL AB 4-0 PS2 18 (SUTURE) IMPLANT
SUT PDS AB 1 TP1 96 (SUTURE) IMPLANT
SUT VIC AB 0 CT1 27 (SUTURE)
SUT VIC AB 0 CT1 27XBRD ANTBC (SUTURE) IMPLANT
SUT VIC AB 2-0 CT1 27 (SUTURE) ×4
SUT VIC AB 2-0 CT1 27XBRD (SUTURE) IMPLANT
SUT VIC AB 2-0 CT1 TAPERPNT 27 (SUTURE) IMPLANT
SUT VICRYL 4-0 PS2 18IN ABS (SUTURE) ×8 IMPLANT
SYR 10ML LL (SYRINGE) ×2 IMPLANT
TOWEL OR NON WOVEN STRL DISP B (DISPOSABLE) ×4 IMPLANT
TRAP SPECIMEN MUCOUS 40CC (MISCELLANEOUS) IMPLANT
TROCAR XCEL NON-BLD 5MMX100MML (ENDOMECHANICALS) IMPLANT
UNDERPAD 30X36 HEAVY ABSORB (UNDERPADS AND DIAPERS) ×4 IMPLANT
WATER STERILE IRR 1000ML POUR (IV SOLUTION) ×4 IMPLANT
YANKAUER SUCT BULB TIP 10FT TU (MISCELLANEOUS) IMPLANT

## 2019-06-30 NOTE — Op Note (Signed)
OPERATIVE NOTE  Pre-operative Diagnosis: CAH  Post-operative Diagnosis: endometrial cancer on frozen section, >50% MI  Operation: Robotic-assisted laparoscopic total hysterectomy with bilateral salpingoophorectomy, bilateral pelvic LND  Surgeon: Heidi Pinch MD  Assistant Surgeon: Lahoma Crocker MD (an MD assistant was necessary for tissue manipulation, management of robotic instrumentation, retraction and positioning due to the complexity of the case and hospital policies).   Anesthesia: GET  Urine Output: 250 cc  Operative Findings: On EUA, small mobile uterus. On intra-abdominal entry, normal upper abdominal survey including diaphragm, liver edge, stomach, small and large bowel. Uterus 8 cm with two serosal fibroids (<1cm). Normal appearing adnexa. Mapping unsuccessful bilaterally (dye seen in posterior cervix and in the parametrial tissue just lateral to the uterus on the right). Some enlarged lymph nodes bilaterally with possible tumor involvement. Significant intra-abdominal adiposity which limited visualization. No intra-abdominal or pelvic evidence of disease at completion of surgery.  Estimated Blood Loss:  150 cc      Total IV Fluids: 1,000 ml         Specimens: uterus, cervix, bilateral tubes and ovaries, bilateral pelvic lymph nodes         Complications:  None apparent; patient tolerated the procedure well.         Disposition: PACU - hemodynamically stable.  Procedure Details  The patient was seen in the Holding Room. The risks, benefits, complications, treatment options, and expected outcomes were discussed with the patient.  The patient concurred with the proposed plan, giving informed consent.  The site of surgery properly noted/marked. The patient was identified as Heidi Avila and the procedure verified as a Robotic-assisted hysterectomy with bilateral salpingo oophorectomy with SLN biopsy.   After induction of anesthesia, the patient was draped and prepped  in the usual sterile manner. Patient was placed in supine position after anesthesia and draped and prepped in the usual sterile manner as follows: Her arms were tucked to her side with all appropriate precautions.  The shoulders were stabilized with padded shoulder blocks applied to the acromium processes.  The patient was placed in the semi-lithotomy position in [place].  The perineum and vagina were prepped with CholoraPrep. The patient was draped after the CholoraPrep had been allowed to dry for 3 minutes.  A Time Out was held and the above information confirmed.  The urethra was prepped with Betadine. Foley catheter was placed.  A sterile speculum was placed in the vagina.  The cervix was grasped with a single-tooth tenaculum. 2mg  total of ICG was injected into the cervical stroma at 2 and 9 o'clock with 1cc injected at a 1cm and 64mm depth (concentration 0.5mg /ml) in all locations. The cervix was dilated with Kennon Rounds dilators.  The ZUMI uterine manipulator with a medium colpotomizer ring was placed without difficulty.  OG tube placement was confirmed and to suction.   Next, a 10 mm skin incision was made 1 cm below the subcostal margin in the midclavicular line.  The 5 mm Optiview port and scope was used for direct entry.  Opening pressure was under 10 mm CO2.  The abdomen was insufflated and the findings were noted as above.   At this point and all points during the procedure, the patient's intra-abdominal pressure did not exceed 15 mmHg. Next, an 8 mm skin incision was made superior to the umbilicus and a right and left port were placed about 8 cm lateral to the robot port on the right and left side.  A fourth arm was placed on the  right.  The 5 mm assist trocar was exchanged for a 10-12 mm port. All ports were placed under direct visualization.  The patient was placed in steep Trendelenburg.  Bowel was folded away into the upper abdomen.  The robot was docked in the normal manner.  The right and  left peritoneum were opened parallel to the IP ligament to open the retroperitoneal spaces bilaterally. The round ligaments were transected. The SLN mapping was performed in bilateral pelvic basins. After identifying the ureters, the para rectal and paravesical spaces were opened up entirely with careful dissection below the level of the ureters bilaterally and to the depth of the uterine artery origin in order to skeletonize the uterine "web" and ensure visualization of all parametrial channels. No ICG was visualized in parametrial channels. The para-aortic basins were exposed and evaluated for isolated para-aortic SLN's to the best of my ability given the patient's body habitus. No lymphatic channels were identified travelling to the sentinel lymph nodes.  The hysterectomy was started.  The ureter was again noted to be on the medial leaf of the broad ligament.  The peritoneum above the ureter was incised and stretched and the infundibulopelvic ligament was skeletonized, cauterized and cut.  The posterior peritoneum was taken down to the level of the KOH ring.  The anterior peritoneum was also taken down.  The bladder flap was created to the level of the KOH ring.  The uterine artery on the right side was skeletonized, cauterized and cut in the normal manner.  A similar procedure was performed on the left.  The colpotomy was made and the uterus, cervix, bilateral ovaries and tubes were amputated and delivered through the vagina.  Pedicles were inspected and excellent hemostasis was achieved.    The colpotomy at the vaginal cuff was closed with Vicryl on a CT1 needle in running manner.    Once frozen section returned showing at least 50% MI and possible serosal involvement, the decision was made to proceed with pelvic LND. A pelvic lymph dissection was performed on the right with the following borders: proximally the bifurcation of the common iliac, distally the circumflex iliac vein, laterally the  genitofemoral nerve, the medial border was the superior vesicle artery and the deep border was the obturator nerve. All lymphatic tissue was removed and sent to Pathology. A similar procedure was performed on the contralateral side. The lymph nodes were placed in Endocatch bags and removed through the assist trocar. Given body habitus and short mesentery, a para-aortic LND was not possible.  Irrigation was used. Given some oozing in the pelvic lymph node beds, Floseal was used and excellent hemostasis was achieved.  At this point in the procedure was completed.  Robotic instruments were removed under direct visulaization.  The robot was undocked. The fascia at the 10-12 mm port was closed with 0 Vicryl on a UR-5 needle.  The subcuticular tissue was closed with 4-0 Vicryl and the skin was closed with 4-0 Monocryl in a subcuticular manner.  Dermabond was applied.    The vagina was swabbed with bleeding noted form a small sulcal laceration. A figure-of-eight with 2-0 Vicryl was used to achieve hemostasis.   All sponge, lap and needle counts were correct x  3.   The patient was transferred to the recovery room in stable condition.  Heidi Pinch, MD

## 2019-06-30 NOTE — Anesthesia Postprocedure Evaluation (Signed)
Anesthesia Post Note  Patient: [place]  Procedure(s) Performed: XI ROBOTIC ASSISTED TOTAL HYSTERECTOMY WITH BILATERAL SALPINGO OOPHORECTOMY, BILATERAL LYMPH NODE DISSECTION (Bilateral Abdomen) SENTINEL LYMPH NODE BIOPSY (N/A )     Patient location during evaluation: PACU Anesthesia Type: General Level of consciousness: awake and alert Pain management: pain level controlled Vital Signs Assessment: post-procedure vital signs reviewed and stable Respiratory status: spontaneous breathing, nonlabored ventilation, respiratory function stable and patient connected to nasal cannula oxygen Cardiovascular status: blood pressure returned to baseline and stable Postop Assessment: no apparent nausea or vomiting Anesthetic complications: no    Last Vitals:  Vitals:   06/30/19 1230 06/30/19 1305  BP: (!) 174/99 (!) 156/83  Pulse: 72 81  Resp: 20 12  Temp: 36.6 C 36.6 C  SpO2: 94% 95%    Last Pain:  Vitals:   06/30/19 1317  TempSrc:   PainSc: Asleep                 Mukhtar Shams,W. EDMOND

## 2019-06-30 NOTE — Brief Op Note (Signed)
06/30/2019  11:21 AM  PATIENT:  [place]  74 y.o. female  PRE-OPERATIVE DIAGNOSIS:  COMPLEX ATYPICAL ENDOMETRIAL HYPERPLASIA  POST-OPERATIVE DIAGNOSIS:  EMCA, >50% myometrial invasion possible serosal involvement on frozen section  PROCEDURE:  Procedure(s): XI ROBOTIC ASSISTED TOTAL HYSTERECTOMY WITH BILATERAL SALPINGO OOPHORECTOMY, BILATERAL LYMPH NODE DISSECTION (Bilateral) SENTINEL LYMPH NODE BIOPSY (N/A)  SURGEON:  Surgeon(s) and Role:    Lafonda Mosses, MD - Primary    * Lahoma Crocker, MD - Assisting  ASSISTANTS: none   ANESTHESIA:   general  EBL:  150 mL   BLOOD ADMINISTERED:none  DRAINS: none   LOCAL MEDICATIONS USED:  MARCAINE     SPECIMEN:  Uterus, cervix, bilateral tubes and ovaries, bilateral pelvic lymph nodes  DISPOSITION OF SPECIMEN:  PATHOLOGY  COUNTS:  YES  TOURNIQUET:  * No tourniquets in log *  DICTATION: .Note written in EPIC  PLAN OF CARE: Admit for overnight observation  PATIENT DISPOSITION:  PACU - hemodynamically stable.   Delay start of Pharmacological VTE agent (>24hrs) due to surgical blood loss or risk of bleeding: no, as long as CBC in am appropriate

## 2019-06-30 NOTE — Interval H&P Note (Signed)
History and Physical Interval Note:  06/30/2019 7:07 AM  Heidi Avila  has presented today for surgery, with the diagnosis of COMPLEX ATYPICAL ENDOMETRIAL HYPERPLASIA.  The various methods of treatment have been discussed with the patient and family. After consideration of risks, benefits and other options for treatment, the patient has consented to  Procedure(s): XI ROBOTIC ASSISTED TOTAL HYSTERECTOMY WITH BILATERAL SALPINGO OOPHORECTOMY, POSSIBLE LYMPH NODE DISSECTION, POSSIBLE LAPAROTOMY (Bilateral) SENTINEL LYMPH NODE BIOPSY (N/A) as a surgical intervention.  The patient's history has been reviewed, patient examined, no change in status, stable for surgery.  I have reviewed the patient's chart and labs.  Questions were answered to the patient's satisfaction.     Lafonda Mosses

## 2019-06-30 NOTE — Transfer of Care (Signed)
Immediate Anesthesia Transfer of Care Note  Patient: Knapp  Procedure(s) Performed: XI ROBOTIC ASSISTED TOTAL HYSTERECTOMY WITH BILATERAL SALPINGO OOPHORECTOMY, BILATERAL LYMPH NODE DISSECTION (Bilateral Abdomen) SENTINEL LYMPH NODE BIOPSY (N/A )  Patient Location: PACU  Anesthesia Type:General  Level of Consciousness: awake, alert  and oriented  Airway & Oxygen Therapy: Patient Spontanous Breathing and Patient connected to face mask oxygen  Post-op Assessment: Report given to RN, Post -op Vital signs reviewed and stable and Patient moving all extremities X 4  Post vital signs: Reviewed and stable  Last Vitals:  Vitals Value Taken Time  BP    Temp    Pulse 71 06/30/19 1129  Resp 14 06/30/19 1129  SpO2 95 % 06/30/19 1129  Vitals shown include unvalidated device data.  Last Pain:  Vitals:   06/30/19 0635  TempSrc:   PainSc: 0-No pain         Complications: No apparent anesthesia complications

## 2019-06-30 NOTE — Anesthesia Procedure Notes (Signed)
Procedure Name: Intubation Date/Time: 06/30/2019 7:35 AM Performed by: Niel Hummer, CRNA Pre-anesthesia Checklist: Patient identified, Emergency Drugs available, Suction available and Patient being monitored Patient Re-evaluated:Patient Re-evaluated prior to induction Oxygen Delivery Method: Circle system utilized Preoxygenation: Pre-oxygenation with 100% oxygen Induction Type: IV induction Ventilation: Mask ventilation without difficulty Laryngoscope Size: Mac and 4 Grade View: Grade I Tube type: Oral Tube size: 7.0 mm Number of attempts: 1 Airway Equipment and Method: Stylet Placement Confirmation: ETT inserted through vocal cords under direct vision,  positive ETCO2 and breath sounds checked- equal and bilateral Secured at: 22 cm Tube secured with: Tape Dental Injury: Teeth and Oropharynx as per pre-operative assessment

## 2019-07-01 LAB — BASIC METABOLIC PANEL
Anion gap: 9 (ref 5–15)
Anion gap: 10 (ref 5–15)
Anion gap: 10 (ref 5–15)
BUN: 19 mg/dL (ref 8–23)
BUN: 24 mg/dL — ABNORMAL HIGH (ref 8–23)
BUN: 24 mg/dL — ABNORMAL HIGH (ref 8–23)
CO2: 24 mmol/L (ref 22–32)
CO2: 23 mmol/L (ref 22–32)
CO2: 24 mmol/L (ref 22–32)
Calcium: 8.8 mg/dL — ABNORMAL LOW (ref 8.9–10.3)
Calcium: 8.6 mg/dL — ABNORMAL LOW (ref 8.9–10.3)
Calcium: 8.9 mg/dL (ref 8.9–10.3)
Chloride: 103 mmol/L (ref 98–111)
Chloride: 101 mmol/L (ref 98–111)
Chloride: 101 mmol/L (ref 98–111)
Creatinine, Ser: 1.87 mg/dL — ABNORMAL HIGH (ref 0.44–1.00)
Creatinine, Ser: 2.33 mg/dL — ABNORMAL HIGH (ref 0.44–1.00)
Creatinine, Ser: 2.51 mg/dL — ABNORMAL HIGH (ref 0.44–1.00)
GFR calc Af Amer: 30 mL/min — ABNORMAL LOW (ref >60)
GFR calc Af Amer: 23 mL/min — ABNORMAL LOW (ref >60)
GFR calc Af Amer: 21 mL/min — ABNORMAL LOW (ref >60)
GFR calc non Af Amer: 26 mL/min — ABNORMAL LOW (ref >60)
GFR calc non Af Amer: 20 mL/min — ABNORMAL LOW (ref >60)
GFR calc non Af Amer: 18 mL/min — ABNORMAL LOW (ref >60)
Glucose, Bld: 137 mg/dL — ABNORMAL HIGH (ref 70–99)
Glucose, Bld: 184 mg/dL — ABNORMAL HIGH (ref 70–99)
Glucose, Bld: 160 mg/dL — ABNORMAL HIGH (ref 70–99)
Potassium: 4.2 mmol/L (ref 3.5–5.1)
Potassium: 3.6 mmol/L (ref 3.5–5.1)
Potassium: 3.5 mmol/L (ref 3.5–5.1)
Sodium: 136 mmol/L (ref 135–145)
Sodium: 134 mmol/L — ABNORMAL LOW (ref 135–145)
Sodium: 135 mmol/L (ref 135–145)

## 2019-07-01 LAB — CBC
HCT: 37.8 % (ref 36.0–46.0)
HCT: 37.2 % (ref 36.0–46.0)
Hemoglobin: 11.9 g/dL — ABNORMAL LOW (ref 12.0–15.0)
Hemoglobin: 11.6 g/dL — ABNORMAL LOW (ref 12.0–15.0)
MCH: 29.3 pg (ref 26.0–34.0)
MCH: 29.1 pg (ref 26.0–34.0)
MCHC: 31.5 g/dL (ref 30.0–36.0)
MCHC: 31.2 g/dL (ref 30.0–36.0)
MCV: 93.1 fL (ref 80.0–100.0)
MCV: 93.2 fL (ref 80.0–100.0)
Platelets: 308 10*3/uL (ref 150–400)
Platelets: 289 10*3/uL (ref 150–400)
RBC: 4.06 MIL/uL (ref 3.87–5.11)
RBC: 3.99 MIL/uL (ref 3.87–5.11)
RDW: 13.5 % (ref 11.5–15.5)
RDW: 13.7 % (ref 11.5–15.5)
WBC: 13.1 10*3/uL — ABNORMAL HIGH (ref 4.0–10.5)
WBC: 10 10*3/uL (ref 4.0–10.5)
nRBC: 0 % (ref 0.0–0.2)
nRBC: 0 % (ref 0.0–0.2)

## 2019-07-01 LAB — CREATININE, URINE, RANDOM: Creatinine, Urine: 274.67 mg/dL

## 2019-07-01 LAB — SODIUM, URINE, RANDOM: Sodium, Ur: 11 mmol/L

## 2019-07-01 MED ORDER — LACTATED RINGERS IV BOLUS
500.0000 mL | Freq: Once | INTRAVENOUS | Status: AC
  Administered 2019-07-01: 500 mL via INTRAVENOUS

## 2019-07-01 MED ORDER — HEPARIN SODIUM (PORCINE) 5000 UNIT/ML IJ SOLN
5000.0000 [IU] | Freq: Three times a day (TID) | INTRAMUSCULAR | Status: DC
  Administered 2019-07-01 – 2019-07-02 (×3): 5000 [IU] via SUBCUTANEOUS
  Filled 2019-07-01 (×4): qty 1

## 2019-07-01 NOTE — Progress Notes (Signed)
Spoke to RN about unsuccessful PIV attempt x2. Pt is pending possible discharge today. Current IV orders are for fluids only. RN agrees with plan to hold off on further PIV attempts until pt assessed by MD.

## 2019-07-01 NOTE — Progress Notes (Signed)
Recent labs and urine studies (FENA) reviewed with Dr. Berline Lopes. Findings consistent with pre-renal cause and patient likely hypovolemic, which fits low BP readings.  Plan to continue with IV resuscitation with repeat labs in the am.

## 2019-07-01 NOTE — Progress Notes (Signed)
1 Day Post-Op Procedure(s) (LRB): XI ROBOTIC ASSISTED TOTAL HYSTERECTOMY WITH BILATERAL SALPINGO OOPHORECTOMY, BILATERAL LYMPH NODE DISSECTION (Bilateral) SENTINEL LYMPH NODE BIOPSY (N/A)  Subjective: Patient reports doing well this am.  She ate her breakfast completely with no nausea or emesis.  Pain relieved with po medications.  Slight dizziness/unsteadiness reported with ambulation.  Voiding without difficulty.  IV was removed last pm and IV team was unable to restart.  Patient states she normally does not drink large amounts of water but she will try to push fluids this am.  No concerns or questions voiced.   Objective: Vital signs in last 24 hours: Temp:  [97.6 F (36.4 C)-99.4 F (37.4 C)] 98.2 F (36.8 C) (03/17 0935) Pulse Rate:  [65-90] 79 (03/17 0935) Resp:  [12-22] 18 (03/17 0935) BP: (94-189)/(51-101) 94/53 (03/17 0935) SpO2:  [92 %-99 %] 92 % (03/17 0935) Last BM Date: 06/30/19  Intake/Output from previous day: 03/16 0701 - 03/17 0700 In: 3212.9 [P.O.:960; I.V.:2002.9; IV Piggyback:250] Out: 1200 [Urine:1050; Blood:150]  Physical Examination: General: alert, cooperative, appears stated age and no distress Resp: clear to auscultation bilaterally Cardio: regular rate and rhythm, S1, S2 normal, no murmur, click, rub or gallop GI: soft, non-tender; bowel sounds normal; no masses,  no organomegaly and incision: lap sites to the abdomen with dermabond intact with no drainage present. Extremities: mild bilateral edema, non pitting  Labs: WBC/Hgb/Hct/Plts:  13.1/11.9/37.8/308 (03/17 0445) BUN/Cr/glu/ALT/AST/amyl/lip:  19/1.87/--/--/--/--/-- (03/17 0445)  Assessment: 74 y.o. s/p Procedure(s): XI ROBOTIC ASSISTED TOTAL HYSTERECTOMY WITH BILATERAL SALPINGO OOPHORECTOMY, BILATERAL LYMPH NODE DISSECTION SENTINEL LYMPH NODE BIOPSY: stable Pain:  Pain is well-controlled on PRN medications.  Heme: Hgb 11.9 and Hct 37.8 this am. Stable compared with pre-op values and surgical  losses.  CV: Slightly hypotensive at times.  HR stable.   GI:  Tolerating po: Yes. Antiemetics ordered if needed.  GU: Voiding without difficulty. Creatinine 1.87 this am. Plan for repeat later today.    FEN: No critical values noted  Prophylaxis: SCDs, lovenox changed to heparin due to increasing creatinine  Plan: Repeat Bmet later this am Increase oral intake of fluids Increase ambulation, IS use, deep breathing, and coughing Plan for possible discharge later today if creatinine is trending down Continue plan of care per Dr. Berline Lopes   LOS: 0 days    Dorothyann Gibbs 07/01/2019, 9:38 AM

## 2019-07-01 NOTE — Progress Notes (Signed)
Patient resting comfortably in bed with son at the bedside.  Tolerating diet with no nausea or emesis.  States she has drank one water pitcher since our visit this am.  Voiding but feels she is not voiding large amounts.  Continues to pass flatus.  Reports intermittent abdominal cramping and stabbing sensation.  No concerns voiced.  Repeat labs from around 14:00 discussed with the patient.  Final pathology also discussed with the patient by Dr. Berline Lopes. Continue plan per Dr. Berline Lopes and plans for IV resuscitation.  Urine studies pending.

## 2019-07-02 DIAGNOSIS — Z6841 Body Mass Index (BMI) 40.0 and over, adult: Secondary | ICD-10-CM | POA: Diagnosis not present

## 2019-07-02 DIAGNOSIS — Z801 Family history of malignant neoplasm of trachea, bronchus and lung: Secondary | ICD-10-CM | POA: Diagnosis not present

## 2019-07-02 DIAGNOSIS — E861 Hypovolemia: Secondary | ICD-10-CM | POA: Diagnosis present

## 2019-07-02 DIAGNOSIS — Z791 Long term (current) use of non-steroidal anti-inflammatories (NSAID): Secondary | ICD-10-CM | POA: Diagnosis not present

## 2019-07-02 DIAGNOSIS — Z79899 Other long term (current) drug therapy: Secondary | ICD-10-CM | POA: Diagnosis not present

## 2019-07-02 DIAGNOSIS — C775 Secondary and unspecified malignant neoplasm of intrapelvic lymph nodes: Secondary | ICD-10-CM | POA: Diagnosis present

## 2019-07-02 DIAGNOSIS — C541 Malignant neoplasm of endometrium: Secondary | ICD-10-CM | POA: Diagnosis present

## 2019-07-02 DIAGNOSIS — K219 Gastro-esophageal reflux disease without esophagitis: Secondary | ICD-10-CM | POA: Diagnosis present

## 2019-07-02 DIAGNOSIS — I959 Hypotension, unspecified: Secondary | ICD-10-CM | POA: Diagnosis present

## 2019-07-02 DIAGNOSIS — N179 Acute kidney failure, unspecified: Secondary | ICD-10-CM | POA: Diagnosis present

## 2019-07-02 DIAGNOSIS — N950 Postmenopausal bleeding: Secondary | ICD-10-CM | POA: Diagnosis present

## 2019-07-02 DIAGNOSIS — D252 Subserosal leiomyoma of uterus: Secondary | ICD-10-CM | POA: Diagnosis present

## 2019-07-02 DIAGNOSIS — Z800 Family history of malignant neoplasm of digestive organs: Secondary | ICD-10-CM | POA: Diagnosis not present

## 2019-07-02 DIAGNOSIS — I129 Hypertensive chronic kidney disease with stage 1 through stage 4 chronic kidney disease, or unspecified chronic kidney disease: Secondary | ICD-10-CM | POA: Diagnosis present

## 2019-07-02 DIAGNOSIS — Z79890 Hormone replacement therapy (postmenopausal): Secondary | ICD-10-CM | POA: Diagnosis not present

## 2019-07-02 DIAGNOSIS — E65 Localized adiposity: Secondary | ICD-10-CM | POA: Diagnosis present

## 2019-07-02 DIAGNOSIS — N189 Chronic kidney disease, unspecified: Secondary | ICD-10-CM | POA: Diagnosis present

## 2019-07-02 DIAGNOSIS — Z96642 Presence of left artificial hip joint: Secondary | ICD-10-CM | POA: Diagnosis present

## 2019-07-02 DIAGNOSIS — Z823 Family history of stroke: Secondary | ICD-10-CM | POA: Diagnosis not present

## 2019-07-02 LAB — CBC
HCT: 36.7 % (ref 36.0–46.0)
Hemoglobin: 11.7 g/dL — ABNORMAL LOW (ref 12.0–15.0)
MCH: 29.8 pg (ref 26.0–34.0)
MCHC: 31.9 g/dL (ref 30.0–36.0)
MCV: 93.4 fL (ref 80.0–100.0)
Platelets: 270 10*3/uL (ref 150–400)
RBC: 3.93 MIL/uL (ref 3.87–5.11)
RDW: 13.6 % (ref 11.5–15.5)
WBC: 8.9 10*3/uL (ref 4.0–10.5)
nRBC: 0 % (ref 0.0–0.2)

## 2019-07-02 LAB — BASIC METABOLIC PANEL
Anion gap: 7 (ref 5–15)
BUN: 21 mg/dL (ref 8–23)
CO2: 25 mmol/L (ref 22–32)
Calcium: 8.8 mg/dL — ABNORMAL LOW (ref 8.9–10.3)
Chloride: 103 mmol/L (ref 98–111)
Creatinine, Ser: 1.81 mg/dL — ABNORMAL HIGH (ref 0.44–1.00)
GFR calc Af Amer: 32 mL/min — ABNORMAL LOW (ref >60)
GFR calc non Af Amer: 27 mL/min — ABNORMAL LOW (ref >60)
Glucose, Bld: 147 mg/dL — ABNORMAL HIGH (ref 70–99)
Potassium: 4.1 mmol/L (ref 3.5–5.1)
Sodium: 135 mmol/L (ref 135–145)

## 2019-07-02 MED ORDER — OXYCODONE HCL 5 MG PO TABS: 5.0000 mg | ORAL_TABLET | ORAL | 0 refills | Status: DC | PRN

## 2019-07-02 NOTE — Progress Notes (Signed)
Discharge instructions discussed with patient, verbalized agreement and understanding 

## 2019-07-02 NOTE — Discharge Instructions (Addendum)
Discharge instructions  Do not take the oxycodone and tramadol together. Use one or the other.  If you are having regular bowel movements with the Amitiza you take at home, you do not need to take the sennakot-S with it.  If your bowels are not moving, then you can add the sennakot-S into your daily regimen until you get back on your normal routine.  DO NOT take any NSAIDS such as ibuprofen, aleve, naproxen due to your kidney function that was elevated slightly before surgery and higher after surgery. Make sure you stay hydrated.  Activity: 1. Be up and out of the bed during the day.  Take a nap if needed.  You may walk up steps but be careful and use the hand rail.  Stair climbing will tire you more than you think, you may need to stop part way and rest.   2. No lifting or straining for 4 weeks.  3. No driving for 1 week minimum.  Do Not drive if you are taking narcotic pain medicine.  4. Shower daily.  Use soap and water on your incision and pat dry; don't rub.   5. No sexual activity and nothing in the vagina for 8 weeks.  Medications:  - Take tylenol first line for pain control. Take this regularly (every 6 hours) to decrease the build up of pain.  - While taking pain medication you should take sennakot or the Amitiza you were taking previously every night to reduce the likelihood of constipation. If this causes diarrhea, stop its use.  Diet: 1. Low sodium Heart Healthy Diet is recommended.  2. It is safe to use a laxative if you have difficulty moving your bowels.   Wound Care: 1. Keep clean and dry.  Shower daily.  Reasons to call the Doctor:   Fever - Oral temperature greater than 100.4 degrees Fahrenheit  Foul-smelling vaginal discharge  Difficulty urinating  Nausea and vomiting  Increased pain at the site of the incision that is unrelieved with pain medicine.  Difficulty breathing with or without chest pain  New calf pain especially if only on one side  Sudden,  continuing increased vaginal bleeding with or without clots.   Follow-up: 1. See Jeral Pinch in 4 weeks.  Contacts: For questions or concerns you should contact:  Dr. Jeral Pinch at [phone] After hours and on week-ends call [phone] and ask to speak to the physician on call for Gynecologic Oncology

## 2019-07-02 NOTE — Discharge Summary (Signed)
Physician Discharge Summary  Patient ID: Heidi Avila MRN: [id number] DOB/AGE: 07-12-1945 74 y.o.  Admit date: 06/30/2019 Discharge date: 07/02/2019  Admission Diagnoses: Endometrial cancer [facility])  Discharge Diagnoses:  Principal Problem:   Endometrial cancer [facility]) Active Problems:   Acute on chronic renal failure [facility])   Discharged Condition: good  Hospital Course:  1/ patient was admitted on 06/30/19 for robotic assisted total hysterectomy, BSO, pelvic lymphadenectomy 2/ surgery was uncomplicated. However postoperatively she had low urine output and rising creatinine. Vitial signs were otherwise normal and her hemoglobin was stable and appropriate therefore there was no evidence of bleeding. Her creatinine maxed at 2.5. Nephrotoxic drugs were discontinued. 3/ on postoperative day 2 the patient was meeting discharge criteria: tolerating PO, voiding urine, ambulating, pain well controlled on oral medications. Her creatinine had decreased to <2.   Consults: None  Significant Diagnostic Studies: labs:  CBC    Component Value Date/Time   WBC 8.9 07/02/2019 0636   RBC 3.93 07/02/2019 0636   HGB 11.7 (L) 07/02/2019 0636   HCT 36.7 07/02/2019 0636   PLT 270 07/02/2019 0636   MCV 93.4 07/02/2019 0636   MCH 29.8 07/02/2019 0636   MCHC 31.9 07/02/2019 0636   RDW 13.6 07/02/2019 0636   BMET    Component Value Date/Time   NA 135 07/02/2019 0636   K 4.1 07/02/2019 0636   CL 103 07/02/2019 0636   CO2 25 07/02/2019 0636   GLUCOSE 147 (H) 07/02/2019 0636   BUN 21 07/02/2019 0636   CREATININE 1.81 (H) 07/02/2019 0636   CALCIUM 8.8 (L) 07/02/2019 0636   GFRNONAA 27 (L) 07/02/2019 0636   GFRAA 32 (L) 07/02/2019 0636     Treatments: IV hydration and surgery: see above  Discharge Exam: Blood pressure 113/67, pulse 86, temperature 98.2 F (36.8 C), temperature source Oral, resp. rate 18, height 5\' 2"  (1.575 m), weight 242 lb (109.8 kg), SpO2 92 %. General appearance: alert and  cooperative GI: soft, non-tender; bowel sounds normal; no masses,  no organomegaly Incision/Wound: well healed incisions.  Disposition: home  Discharge Instructions    Call MD for:  difficulty breathing, headache or visual disturbances   Complete by: As directed    Call MD for:  extreme fatigue   Complete by: As directed    Call MD for:  persistant dizziness or light-headedness   Complete by: As directed    Call MD for:  persistant nausea and vomiting   Complete by: As directed    Call MD for:  redness, tenderness, or signs of infection (pain, swelling, redness, odor or green/yellow discharge around incision site)   Complete by: As directed    Call MD for:  severe uncontrolled pain   Complete by: As directed    Call MD for:  temperature >100.4   Complete by: As directed    Diet general   Complete by: As directed    Driving Restrictions   Complete by: As directed    No driving x 72 hrs   Increase activity slowly   Complete by: As directed    Gradually increase over 2 - 7 days   Lifting restrictions   Complete by: As directed    Avoid lifting > 20 lbs x 2 weeks.   May shower / Bathe   Complete by: As directed    May walk up steps   Complete by: As directed    No dressing needed   Complete by: As directed    Other Restrictions  Complete by: As directed    Exercise in 5 - 10 days.  Swim in 7 days.  Douching is not recommended.   Sexual Activity Restrictions   Complete by: As directed    No intercourse x 6 weeks     Allergies as of 07/02/2019      Reactions   Amoxicillin Rash   Did it involve swelling of the face/tongue/throat, SOB, or low BP? No Did it involve sudden or severe rash/hives, skin peeling, or any reaction on the inside of your mouth or nose? No Did you need to seek medical attention at a hospital or doctor's office? No When did it last happen?7 + years If all above answers are "NO", may proceed with cephalosporin use.   Latex Rash       Medication List    STOP taking these medications   medroxyPROGESTERone 10 MG tablet Commonly known as: PROVERA     TAKE these medications   Amitiza 24 MCG capsule Generic drug: lubiprostone Take 48 mcg by mouth every evening.   B COMPLEX PO Take 1 Dose by mouth daily. Liquid b complex   benazepril-hydrochlorthiazide 20-25 MG tablet Commonly known as: LOTENSIN HCT Take 1 tablet by mouth every evening.   brimonidine 0.2 % ophthalmic solution Commonly known as: ALPHAGAN Place 1 drop into the right eye in the morning and at bedtime.   diphenhydrAMINE 25 MG tablet Commonly known as: BENADRYL Take 50 mg by mouth daily as needed for allergies.   gabapentin 600 MG tablet Commonly known as: NEURONTIN Take 600-1,200 mg by mouth See admin instructions. Take 1200 mg in the morning, 600 mg in the afternoon, and 600 mg at night   ketorolac 0.5 % ophthalmic solution Commonly known as: ACULAR Place 1 drop into the right eye 4 (four) times daily.   Melatonin 5 MG Caps Take 5 mg by mouth at bedtime.   meloxicam 15 MG tablet Commonly known as: MOBIC Take 15 mg by mouth daily in the afternoon.   ofloxacin 0.3 % ophthalmic solution Commonly known as: OCUFLOX Place 1 drop into the right eye 4 (four) times daily.   omeprazole 20 MG tablet Commonly known as: PRILOSEC OTC Take 20 mg by mouth every evening.   oxyCODONE 5 MG immediate release tablet Commonly known as: Oxy IR/ROXICODONE Take 1 tablet (5 mg total) by mouth every 4 (four) hours as needed for severe pain. Do not take with tramadol and do not take and drive   senna-docusate 8.6-50 MG tablet Commonly known as: Senokot-S Take 2 tablets by mouth at bedtime. For AFTER surgery, do not take if having diarrhea   tiZANidine 4 MG tablet Commonly known as: ZANAFLEX Take 4 mg by mouth at bedtime.   topiramate 25 MG tablet Commonly known as: TOPAMAX Take 75 mg by mouth at bedtime.   traMADol 50 MG tablet Commonly known as:  ULTRAM Take 1 tablet (50 mg total) by mouth every 6 (six) hours as needed for severe pain. For AFTER surgery, do not take and drive   VICKS SINEX 12 HOUR NA Place 1 spray into the nose 2 (two) times daily as needed (congestion).      Follow-up Information    Lafonda Mosses, MD Follow up on 07/09/2019.   Specialty: Gynecologic Oncology Why: at 3:30pm will be a PHONE visit to go over tumor board recommendations and see how you are doing. On 07/23/2019 at 1pm will be an in person visit at the [facility] with Dr. Berline Lopes.  Contact information: [place] [address] [phone]           Signed: Thereasa Solo 07/02/2019, 10:47 AM

## 2019-07-03 ENCOUNTER — Telehealth: Payer: Self-pay | Admitting: *Deleted

## 2019-07-03 LAB — SURGICAL PATHOLOGY

## 2019-07-03 NOTE — Telephone Encounter (Signed)
Pt states that she is "feeling better today". Pt reports that her pain is under control w/ taking the prescribed medications. She is eating and drinking well. Denies N/V, fever, chills or discharge from incisions. She she had a BM this morning and is "urinating a lot": Pt is aware of upcoming f/u appointments and has our clinic phone number if she has any questions or concerns.

## 2019-07-06 ENCOUNTER — Telehealth: Payer: Self-pay | Admitting: Oncology

## 2019-07-06 ENCOUNTER — Other Ambulatory Visit: Payer: Self-pay | Admitting: Oncology

## 2019-07-06 ENCOUNTER — Other Ambulatory Visit: Payer: Self-pay | Admitting: Gynecologic Oncology

## 2019-07-06 DIAGNOSIS — C541 Malignant neoplasm of endometrium: Secondary | ICD-10-CM

## 2019-07-06 NOTE — Telephone Encounter (Signed)
Called Heidi Avila with appointments for CT on 07/14/19.  She said she needs reschedule it to a Monday, Wednesday or Friday when she is off work.  Rescheduled CT for 07/16/19 at 1:30 with labs before at 12:30 at the [facility].  Gave her the CT instructions and advised her to pick up contrast before the scan.  She would like a call the day before to remind her of instructions.  Also scheduled appointment with Dr. Alvy Bimler on 07/20/19 at 12 pm and notified her that Radiation Oncology will also be calling with an appointment.  She verbalized understanding and agreement in teach back mode.

## 2019-07-06 NOTE — Progress Notes (Signed)
Gynecologic Oncology Multi-Disciplinary Disposition Conference Note  Date of the Conference: 07/06/2019  Patient Name: Heidi Avila  Primary GYN Oncologist: Dr. Tucker  Stage/Disposition:  Stage 3 endometrioid adenocarcinoma. Disposition is for CT imaging, chemotherapy and vaginal brachytherapy. If patient is not able to tolerate chemotherapy, then pembrolizumab if MMR deficient/whole pelvic radiation.    This Multidisciplinary conference took place involving physicians from Gynecologic Oncology, Medical Oncology, Radiation Oncology, Pathology, Radiology along with the Gynecologic Oncology Nurse Practitioner and RN.  Comprehensive assessment of the patient's malignancy, staging, need for surgery, chemotherapy, radiation therapy, and need for further testing were reviewed. Supportive measures, both inpatient and following discharge were also discussed. The recommended plan of care is documented. Greater than 35 minutes were spent correlating and coordinating this patient's care.  

## 2019-07-06 NOTE — Addendum Note (Signed)
Addended by: Elmo Putt R on: 07/06/2019 11:13 AM   Modules accepted: Orders

## 2019-07-08 NOTE — Progress Notes (Signed)
Gynecologic Oncology Telehealth Consult Note: Gyn-Onc  I connected with Heidi Avila on 07/09/19 at  3:30 PM EDT by telephone and verified that I am speaking with the correct person using two identifiers.  I discussed the limitations, risks, security and privacy concerns of performing an evaluation and management service by telemedicine and the availability of in-person appointments. I also discussed with the patient that there may be a patient responsible charge related to this service. The patient expressed understanding and agreed to proceed.  Other persons participating in the visit and their role in the encounter: None.  Patient's location: Home Provider's location: [facility]  Reason for Visit: Postop follow-up and treatment plan discussion  Treatment History: Oncology History Overview Note  Presented in February 2021 with postmenopausal bleeding.  She was started on Provera and underwent endometrial biopsy showing complex atypical hyperplasia with a possible focus suspicious for grade 1 endometrial cancer.   Endometrial cancer ([place])  05/18/2019 Initial Biopsy   EMB: Complex atypical hyperplasia, cannot rule out microscopic focus of FIGO grade 1 carcinoma   05/21/2019 Imaging   Pelvic ultrasound at [facility]: Anteverted uterus measuring 8.2 cm in greatest dimension.  Endometrial lining measures 1.73 cm and is thickened.  Left ovary not visualized, right ovary normal in appearance.   06/30/2019 Initial Diagnosis   Endometrial cancer ([place])   06/30/2019 Surgery   Robotic-assisted laparoscopic total hysterectomy with bilateral salpingoophorectomy, bilateral pelvic LND  Frozen section showed endometrial cancer with more than 50% myometrial invasion.  Patient did not map to a sentinel lymph node bilaterally.   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  Tumor measures 3.5 cm and invades focally into the serosa.  No  involvement of the cervix or bilateral adnexa.  1 of 20 lymph nodes positive for metastatic carcinoma, focus measures 1 cm without evidence of extracapsular extension.   06/30/2019 Cancer Staging   Staging form: Corpus Uteri - Carcinoma and Carcinosarcoma, AJCC 8th Edition - Clinical stage from 06/30/2019: FIGO Stage IIIC1 (cT3, cN1a, cM0) - Signed by Lafonda Mosses, MD on 07/09/2019     Interval History: The patient reports overall doing well since being discharged from the hospital.  She denies any vaginal bleeding or discharge.  Her appetite is improving and she has been working on drinking lots of liquids, including water.  She denies any urinary symptoms.  Her bowel function has returned to normal.  She uses Tylenol as needed for pain but otherwise is not taking other pain medications.  She denies any fevers or chills.  Past Medical/Surgical History: Past Medical History:  Diagnosis Date  . Cataract   . Complex endometrial hyperplasia with atypia   . GERD (gastroesophageal reflux disease)   . Heart murmur    as a child  . Hypertension   . Obesity   . Pneumonia 2018  . Post-menopausal bleeding 05/18/2019    Past Surgical History:  Procedure Laterality Date  . BIOPSY ENDOMETRIAL    . CATARACT EXTRACTION  03/2019  . ESOPHAGOGASTRODUODENOSCOPY    . ROBOTIC ASSISTED TOTAL HYSTERECTOMY WITH BILATERAL SALPINGO OOPHERECTOMY Bilateral 06/30/2019   Procedure: XI ROBOTIC ASSISTED TOTAL HYSTERECTOMY WITH BILATERAL SALPINGO OOPHORECTOMY, BILATERAL LYMPH NODE DISSECTION;  Surgeon: Lafonda Mosses, MD;  Location: [facility];  Service: Gynecology;  Laterality: Bilateral;  . SENTINEL NODE BIOPSY N/A 06/30/2019   Procedure: SENTINEL LYMPH NODE BIOPSY;  Surgeon: Lafonda Mosses, MD;  Location: [facility];  Service: Gynecology;  Laterality:  N/A;  . TONSILLECTOMY    . TOTAL HIP ARTHROPLASTY Left   . WISDOM TOOTH EXTRACTION      Family History  Problem Relation Age of Onset  . Colon cancer  Mother 84       treated with surgery only  . Lung cancer Mother   . CVA Father   . Endometrial cancer Neg Hx   . Ovarian cancer Neg Hx   . Breast cancer Neg Hx     Social History   Socioeconomic History  . Marital status: Divorced    Spouse name: Not on file  . Number of children: Not on file  . Years of education: Not on file  . Highest education level: Not on file  Occupational History  . Not on file  Tobacco Use  . Smoking status: Never Smoker  . Smokeless tobacco: Never Used  Substance and Sexual Activity  . Alcohol use: No  . Drug use: Never  . Sexual activity: Not Currently  Other Topics Concern  . Not on file  Social History Narrative  . Not on file   Social Determinants of Health   Financial Resource Strain:   . Difficulty of Paying Living Expenses:   Food Insecurity:   . Worried About Charity fundraiser in the Last Year:   . Arboriculturist in the Last Year:   Transportation Needs:   . Film/video editor (Medical):   Marland Kitchen Lack of Transportation (Non-Medical):   Physical Activity:   . Days of Exercise per Week:   . Minutes of Exercise per Session:   Stress:   . Feeling of Stress :   Social Connections:   . Frequency of Communication with Friends and Family:   . Frequency of Social Gatherings with Friends and Family:   . Attends Religious Services:   . Active Member of Clubs or Organizations:   . Attends Archivist Meetings:   Marland Kitchen Marital Status:     Current Medications:  Current Outpatient Medications:  .  AMITIZA 24 MCG capsule, Take 48 mcg by mouth every evening. , Disp: , Rfl:  .  B Complex Vitamins (B COMPLEX PO), Take 1 Dose by mouth daily. Liquid b complex, Disp: , Rfl:  .  benazepril-hydrochlorthiazide (LOTENSIN HCT) 20-25 MG tablet, Take 1 tablet by mouth every evening. , Disp: , Rfl:  .  brimonidine (ALPHAGAN) 0.2 % ophthalmic solution, Place 1 drop into the right eye in the morning and at bedtime., Disp: , Rfl:  .   diphenhydrAMINE (BENADRYL) 25 MG tablet, Take 50 mg by mouth daily as needed for allergies., Disp: , Rfl:  .  gabapentin (NEURONTIN) 600 MG tablet, Take 600-1,200 mg by mouth See admin instructions. Take 1200 mg in the morning, 600 mg in the afternoon, and 600 mg at night, Disp: , Rfl:  .  ketorolac (ACULAR) 0.5 % ophthalmic solution, Place 1 drop into the right eye 4 (four) times daily. , Disp: , Rfl:  .  Melatonin 5 MG CAPS, Take 5 mg by mouth at bedtime., Disp: , Rfl:  .  meloxicam (MOBIC) 15 MG tablet, Take 15 mg by mouth daily in the afternoon. , Disp: , Rfl:  .  ofloxacin (OCUFLOX) 0.3 % ophthalmic solution, Place 1 drop into the right eye 4 (four) times daily. , Disp: , Rfl:  .  omeprazole (PRILOSEC OTC) 20 MG tablet, Take 20 mg by mouth every evening., Disp: , Rfl:  .  oxyCODONE (OXY IR/ROXICODONE) 5 MG  immediate release tablet, Take 1 tablet (5 mg total) by mouth every 4 (four) hours as needed for severe pain. Do not take with tramadol and do not take and drive, Disp: 10 tablet, Rfl: 0 .  Oxymetazoline HCl (VICKS SINEX 12 HOUR NA), Place 1 spray into the nose 2 (two) times daily as needed (congestion)., Disp: , Rfl:  .  senna-docusate (SENOKOT-S) 8.6-50 MG tablet, Take 2 tablets by mouth at bedtime. For AFTER surgery, do not take if having diarrhea (Patient not taking: Reported on 06/22/2019), Disp: 30 tablet, Rfl: 1 .  tiZANidine (ZANAFLEX) 4 MG tablet, Take 4 mg by mouth at bedtime., Disp: , Rfl:  .  topiramate (TOPAMAX) 25 MG tablet, Take 75 mg by mouth at bedtime., Disp: , Rfl:  .  traMADol (ULTRAM) 50 MG tablet, Take 1 tablet (50 mg total) by mouth every 6 (six) hours as needed for severe pain. For AFTER surgery, do not take and drive (Patient not taking: Reported on 06/22/2019), Disp: 10 tablet, Rfl: 0  Review of Symptoms: Pertinent positives as per HPI.  Otherwise review of systems negative.  Physical Exam: There were no vitals taken for this visit. Not performed given limitations of  visit.  Laboratory & Radiologic Studies:  UTERUS, CARCINOMA OR CARCINOSARCOMA   Procedure: Total hysterectomy and bilateral salpingo-oophorectomy  Histologic type: Endometrioid adenocarcinoma  Histologic Grade: FIGO grade 1  Myometrial invasion:    Depth of invasion: 30 mm    Myometrial thickness: 30 mm  Uterine Serosa Involvement: Present, focal  Cervical stromal involvement: Not identified  Extent of involvement of other organs: Not identified  Lymphovascular invasion: Not identified  Regional Lymph Nodes:    Examined:   0 Sentinel                20 Non-sentinel                20 Total     Lymph nodes with metastasis: 1     Isolated tumor cells (<0.2 mm): 0     Micrometastasis: (>0.2 mm and < 2.0 mm): 0     Macrometastasis: (>2.0 mm): 1     Extracapsular extension: Not identified  Representative Tumor Block: A6  MMR / MSI testing: Will be ordered  Pathologic Stage Classification (pTNM, AJCC 8th edition): pT3, pN1a  Comments: None   IHC SUMMARY INTERPRETATION: ABNORMAL   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 The  loss of expression may be secondary to promoter hyper-methylation, gene  mutation or other genetic event. BRAF mutation testing and/or MLH1  methylation testing is indicated. The presence of a BRAF mutation and/or  MLH1 hypermethylation is indicative of a sporadic-type tumor. The  absence of either BRAF mutation and/or presence of normal methylation  indicate the possible presence of a hereditary germline mutation (e.g.  Lynch syndrome) and referral to genetic counseling is warranted. It is  recommended that the loss of protein expression be correlated with  molecular based MSI testing.   IHC EXPRESSION RESULTS   TEST      RESULT  MLH1:     LOSS OF NUCLEAR EXPRESSION  MSH2:     Preserved nuclear expression  MSH6:     Preserved nuclear expression  PMS2:      LOSS OF NUCLEAR EXPRESSION   Assessment & Plan: Heidi Avila is a 74 y.o. 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  Patient is overall doing well from a postoperative standpoint and meeting milestones.  We discussed again her pathology report, as I had reviewed with her during the hospitalization.  We also reviewed the conversation at tumor board this past Monday and that recommendation is for systemic treatment with chemotherapy followed by vaginal brachytherapy.  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  Her MSI testing is pending.  If patient has difficulty with chemotherapy and/or her MSI testing reveals that she is MMR deficient, then we discussed at tumor board the possibility of whole pelvic radiation in combination with pembrolizumab.  The patient is currently scheduled for appointments on April 5, 7th and 8th here at the cancer center to see myself, Dr. Alvy Bimler and Dr. Sondra Come.  I have asked Heidi Avila, our nurse navigator, to try to consolidate these appointments and reach out to the patient to review the schedule with her.  I discussed the assessment and treatment plan with the patient. The patient was provided with an opportunity to ask questions and all were answered. The patient agreed with the plan and demonstrated an understanding of the instructions.   The patient was advised to call back or see an in-person evaluation if the symptoms worsen or if the condition fails to improve as anticipated.   15 minutes of total time was spent for this patient encounter, including preparation, face-to-face counseling with the patient and coordination of care, and documentation of the encounter.   Jeral Pinch, MD  Division of Gynecologic Oncology  Department of Obstetrics and Gynecology  [facility]

## 2019-07-09 ENCOUNTER — Inpatient Hospital Stay: Payer: Medicare Other | Attending: Gynecologic Oncology | Admitting: Gynecologic Oncology

## 2019-07-09 ENCOUNTER — Telehealth: Payer: Self-pay | Admitting: Oncology

## 2019-07-09 DIAGNOSIS — C541 Malignant neoplasm of endometrium: Secondary | ICD-10-CM

## 2019-07-09 NOTE — Telephone Encounter (Signed)
Called Heidi Avila and advised her of appointments on 07/22/19 to see Dr. Sondra Come and Dr. Alvy Bimler.  Also went over appointments on 07/23/19 with Dr. Berline Lopes and for Patient Education.  She verbalized understanding in teach back mode.

## 2019-07-11 DIAGNOSIS — Z23 Encounter for immunization: Secondary | ICD-10-CM | POA: Diagnosis not present

## 2019-07-14 ENCOUNTER — Ambulatory Visit (HOSPITAL_COMMUNITY): Payer: Medicare Other

## 2019-07-15 ENCOUNTER — Telehealth: Payer: Self-pay | Admitting: Oncology

## 2019-07-15 NOTE — Telephone Encounter (Signed)
Called Heidi Avila and reviewed CT scan instructions and appointment times for tomorrow.  She verbalized agreement and understanding.

## 2019-07-16 ENCOUNTER — Inpatient Hospital Stay: Payer: Medicare Other | Attending: Gynecologic Oncology

## 2019-07-16 ENCOUNTER — Encounter (HOSPITAL_COMMUNITY): Payer: Self-pay

## 2019-07-16 ENCOUNTER — Telehealth: Payer: Self-pay | Admitting: Oncology

## 2019-07-16 ENCOUNTER — Other Ambulatory Visit: Payer: Self-pay

## 2019-07-16 ENCOUNTER — Ambulatory Visit (HOSPITAL_COMMUNITY)
Admission: RE | Admit: 2019-07-16 | Discharge: 2019-07-16 | Disposition: A | Discharge status: Home / Self Care | Payer: Medicare Other | Source: Ambulatory Visit | Attending: Gynecologic Oncology | Admitting: Gynecologic Oncology

## 2019-07-16 DIAGNOSIS — G629 Polyneuropathy, unspecified: Secondary | ICD-10-CM | POA: Diagnosis not present

## 2019-07-16 DIAGNOSIS — Z5111 Encounter for antineoplastic chemotherapy: Secondary | ICD-10-CM | POA: Insufficient documentation

## 2019-07-16 DIAGNOSIS — Z79899 Other long term (current) drug therapy: Secondary | ICD-10-CM | POA: Insufficient documentation

## 2019-07-16 DIAGNOSIS — Z90722 Acquired absence of ovaries, bilateral: Secondary | ICD-10-CM | POA: Insufficient documentation

## 2019-07-16 DIAGNOSIS — R011 Cardiac murmur, unspecified: Secondary | ICD-10-CM | POA: Insufficient documentation

## 2019-07-16 DIAGNOSIS — Z90710 Acquired absence of both cervix and uterus: Secondary | ICD-10-CM | POA: Diagnosis not present

## 2019-07-16 DIAGNOSIS — I129 Hypertensive chronic kidney disease with stage 1 through stage 4 chronic kidney disease, or unspecified chronic kidney disease: Secondary | ICD-10-CM | POA: Diagnosis not present

## 2019-07-16 DIAGNOSIS — K5909 Other constipation: Secondary | ICD-10-CM | POA: Diagnosis not present

## 2019-07-16 DIAGNOSIS — C541 Malignant neoplasm of endometrium: Secondary | ICD-10-CM | POA: Diagnosis not present

## 2019-07-16 DIAGNOSIS — C775 Secondary and unspecified malignant neoplasm of intrapelvic lymph nodes: Secondary | ICD-10-CM | POA: Insufficient documentation

## 2019-07-16 DIAGNOSIS — N189 Chronic kidney disease, unspecified: Secondary | ICD-10-CM | POA: Insufficient documentation

## 2019-07-16 DIAGNOSIS — K219 Gastro-esophageal reflux disease without esophagitis: Secondary | ICD-10-CM | POA: Insufficient documentation

## 2019-07-16 LAB — CBC WITH DIFFERENTIAL/PLATELET
Abs Immature Granulocytes: 0.03 10*3/uL (ref 0.00–0.07)
Basophils Absolute: 0.1 10*3/uL (ref 0.0–0.1)
Basophils Relative: 1 %
Eosinophils Absolute: 0.3 10*3/uL (ref 0.0–0.5)
Eosinophils Relative: 4 %
HCT: 42.5 % (ref 36.0–46.0)
Hemoglobin: 13.8 g/dL (ref 12.0–15.0)
Immature Granulocytes: 0 %
Lymphocytes Relative: 15 %
Lymphs Abs: 1.4 10*3/uL (ref 0.7–4.0)
MCH: 29.2 pg (ref 26.0–34.0)
MCHC: 32.5 g/dL (ref 30.0–36.0)
MCV: 90 fL (ref 80.0–100.0)
Monocytes Absolute: 0.7 10*3/uL (ref 0.1–1.0)
Monocytes Relative: 8 %
Neutro Abs: 6.5 10*3/uL (ref 1.7–7.7)
Neutrophils Relative %: 72 %
Platelets: 360 10*3/uL (ref 150–400)
RBC: 4.72 MIL/uL (ref 3.87–5.11)
RDW: 13.4 % (ref 11.5–15.5)
WBC: 9 10*3/uL (ref 4.0–10.5)
nRBC: 0 % (ref 0.0–0.2)

## 2019-07-16 LAB — CMP (CANCER CENTER ONLY)
ALT: 15 U/L (ref 0–44)
AST: 13 U/L — ABNORMAL LOW (ref 15–41)
Albumin: 3.9 g/dL (ref 3.5–5.0)
Alkaline Phosphatase: 44 U/L (ref 38–126)
Anion gap: 12 (ref 5–15)
BUN: 15 mg/dL (ref 8–23)
CO2: 23 mmol/L (ref 22–32)
Calcium: 9.6 mg/dL (ref 8.9–10.3)
Chloride: 105 mmol/L (ref 98–111)
Creatinine: 1.21 mg/dL — ABNORMAL HIGH (ref 0.44–1.00)
GFR, Est AFR Am: 51 mL/min — ABNORMAL LOW (ref >60)
GFR, Estimated: 44 mL/min — ABNORMAL LOW (ref >60)
Glucose, Bld: 147 mg/dL — ABNORMAL HIGH (ref 70–99)
Potassium: 3.6 mmol/L (ref 3.5–5.1)
Sodium: 140 mmol/L (ref 135–145)
Total Bilirubin: 0.5 mg/dL (ref 0.3–1.2)
Total Protein: 7.1 g/dL (ref 6.5–8.1)

## 2019-07-16 MED ORDER — IOHEXOL 300 MG/ML  SOLN
100.0000 mL | Freq: Once | INTRAMUSCULAR | Status: AC | PRN
  Administered 2019-07-16: 14:00:00 80 mL via INTRAVENOUS

## 2019-07-16 MED ORDER — SODIUM CHLORIDE (PF) 0.9 % IJ SOLN
INTRAMUSCULAR | Status: AC
  Filled 2019-07-16: qty 50

## 2019-07-16 NOTE — Progress Notes (Signed)
[facility] Note  07/20/2019     CHIEF COMPLAINT Patient presents for Retina Evaluation   HISTORY OF PRESENT ILLNESS: Heidi Avila is a 74 y.o. female who presents to the clinic today for:   HPI    Retina Evaluation    In both eyes.  This started weeks ago.  Duration of weeks.  Context:  distance vision and near vision.  I, the attending physician,  performed the HPI with the patient and updated documentation appropriately.          Comments    Current gtts: Ketorolac--uses when eyes feel scratchy Pt states her vision is much better now that she has had CE/IOL OU.  Pt's depth perception was problematic after CE/IOL OS because right eye had not been done yet.  Pt reports severe nearsightedness since very young age and states her vision now, is better than it has ever been.   Pt denies eye pain or discomfort and denies any new or worsening floaters or fol OU.  CE/IOL OS: 04/07/19 CE/IOL OD: 06/16/19  Pt had total hysterectomy and lymph node removal on 06/30/2019--pt had severe post menopausal bleeding and was place on oral medication (? Name) to reduce amount of bleeding she had.  Pt states OBGYN said hysterectomy was needed, even if bleeding stopped.  Pt learned after hysterectomy that she has Stage 1 Endometrial Cancer.  Pt had C scan this week and returns next week for results and treatment options for Endometrial Cancer.  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       Last edited by Bernarda Caffey, MD on 07/20/2019  2:02 PM. (History)    pt has cataract sx OU with Dr. Herbert Deaner, who wanted her seen by a retina specialist bc of her hx of high myopia, pt states she was very happy with the outcome of the surgeries, pt states she takes medication for high blood pressure, but has never been told she is diabetic  Referring physician: [place], [place], [place],  [place]  [id number]  HISTORICAL INFORMATION:   Selected notes from the MEDICAL RECORD NUMBER Pt referred by Dr. Parke Simmers for surgical clearance, given Hx of high myopia LEE: 07/09/2019 BCVA: OD: 20/25, OS: 20/20   CURRENT MEDICATIONS: Current Outpatient Medications (Ophthalmic Drugs)  Medication Sig  . brimonidine (ALPHAGAN) 0.2 % ophthalmic solution Place 1 drop into the right eye in the morning and at bedtime.  Marland Kitchen ketorolac (ACULAR) 0.5 % ophthalmic solution Place 1 drop into the right eye 4 (four) times daily.   Marland Kitchen ofloxacin (OCUFLOX) 0.3 % ophthalmic solution Place 1 drop into the right eye 4 (four) times daily.    No current facility-administered medications for this visit. (Ophthalmic Drugs)   Current Outpatient Medications (Other)  Medication Sig  . AMITIZA 24 MCG capsule Take 48 mcg by mouth every evening.   . B Complex Vitamins (B COMPLEX PO) Take 1 Dose by mouth daily. Liquid b complex  . benazepril-hydrochlorthiazide (LOTENSIN HCT) 20-25 MG tablet Take 1 tablet by mouth every evening.   . diphenhydrAMINE (BENADRYL) 25 MG tablet Take 50 mg by mouth daily as needed for allergies.  Marland Kitchen gabapentin (NEURONTIN) 600 MG tablet Take 600-1,200 mg by mouth See admin instructions. Take 1200 mg in the morning, 600 mg in the afternoon, and 600 mg at night  . Melatonin 5 MG CAPS Take 5 mg  by mouth at bedtime.  . meloxicam (MOBIC) 15 MG tablet Take 15 mg by mouth daily in the afternoon.   Marland Kitchen omeprazole (PRILOSEC OTC) 20 MG tablet Take 20 mg by mouth every evening.  Marland Kitchen oxyCODONE (OXY IR/ROXICODONE) 5 MG immediate release tablet Take 1 tablet (5 mg total) by mouth every 4 (four) hours as needed for severe pain. Do not take with tramadol and do not take and drive  . Oxymetazoline HCl (VICKS SINEX 12 HOUR NA) Place 1 spray into the nose 2 (two) times daily as needed (congestion).  Marland Kitchen senna-docusate (SENOKOT-S) 8.6-50 MG tablet Take 2 tablets by mouth at bedtime. For AFTER surgery, do not take if having diarrhea (Patient  not taking: Reported on 06/22/2019)  . tiZANidine (ZANAFLEX) 4 MG tablet Take 4 mg by mouth at bedtime.  . topiramate (TOPAMAX) 25 MG tablet Take 75 mg by mouth at bedtime.  . traMADol (ULTRAM) 50 MG tablet Take 1 tablet (50 mg total) by mouth every 6 (six) hours as needed for severe pain. For AFTER surgery, do not take and drive (Patient not taking: Reported on 06/22/2019)   No current facility-administered medications for this visit. (Other)      REVIEW OF SYSTEMS: ROS    Positive for: Eyes, Psychiatric, Heme/Lymph   Negative for: Constitutional, Gastrointestinal, Neurological, Skin, Genitourinary, Musculoskeletal ("), HENT, Endocrine, Cardiovascular, Respiratory, Allergic/Imm   Last edited by Doneen Poisson on 07/20/2019  1:45 PM. (History)       ALLERGIES Allergies  Allergen Reactions  . Amoxicillin Rash    Did it involve swelling of the face/tongue/throat, SOB, or low BP? No Did it involve sudden or severe rash/hives, skin peeling, or any reaction on the inside of your mouth or nose? No Did you need to seek medical attention at a hospital or doctor's office? No When did it last happen?7 + years If all above answers are "NO", may proceed with cephalosporin use.   . Latex Rash    PAST MEDICAL HISTORY Past Medical History:  Diagnosis Date  . Cataract   . Complex endometrial hyperplasia with atypia   . GERD (gastroesophageal reflux disease)   . Heart murmur    as a child  . Hypertension   . Obesity   . Pneumonia 2018  . Post-menopausal bleeding 05/18/2019   Past Surgical History:  Procedure Laterality Date  . BIOPSY ENDOMETRIAL    . CATARACT EXTRACTION  03/2019  . ESOPHAGOGASTRODUODENOSCOPY    . ROBOTIC ASSISTED TOTAL HYSTERECTOMY WITH BILATERAL SALPINGO OOPHERECTOMY Bilateral 06/30/2019   Procedure: XI ROBOTIC ASSISTED TOTAL HYSTERECTOMY WITH BILATERAL SALPINGO OOPHORECTOMY, BILATERAL LYMPH NODE DISSECTION;  Surgeon: Lafonda Mosses, MD;  Location: [facility];   Service: Gynecology;  Laterality: Bilateral;  . SENTINEL NODE BIOPSY N/A 06/30/2019   Procedure: SENTINEL LYMPH NODE BIOPSY;  Surgeon: Lafonda Mosses, MD;  Location: [facility];  Service: Gynecology;  Laterality: N/A;  . TONSILLECTOMY    . TOTAL HIP ARTHROPLASTY Left   . WISDOM TOOTH EXTRACTION      FAMILY HISTORY Family History  Problem Relation Age of Onset  . Colon cancer Mother 64       treated with surgery only  . Lung cancer Mother   . CVA Father   . Endometrial cancer Neg Hx   . Ovarian cancer Neg Hx   . Breast cancer Neg Hx     SOCIAL HISTORY Social History   Tobacco Use  . Smoking status: Never Smoker  . Smokeless tobacco: Never Used  Substance Use Topics  . Alcohol use: No  . Drug use: Never         OPHTHALMIC EXAM:  Base Eye Exam    Visual Acuity (Snellen - Linear)      Right Left   Dist [place] 20/30 -1 20/30 -1   Dist ph [place] 20/25 +1 20/20 -2       Tonometry (Tonopen, 1:34 PM)      Right Left   Pressure 18 17       Pupils      Dark Light Shape React APD   Right 2 1 Round Minimal 0   Left 2 1 Round Minimal 0       Visual Fields      Left Right    Full Full       Extraocular Movement      Right Left    Full Full       Neuro/Psych    Oriented x3: Yes   Mood/Affect: Normal       Dilation    Both eyes: 1.0% Mydriacyl, 2.5% Phenylephrine @ 1:34 PM        Slit Lamp and Fundus Exam    Slit Lamp Exam      Right Left   Lids/Lashes Dermatochalasis - upper lid Dermatochalasis - upper lid   Conjunctiva/Sclera White and quiet White and quiet   Cornea Arcus, trace Punctate epithelial erosions, mild EBMD Arcus, trace Punctate epithelial erosions, well healed temporal cataract wounds   Anterior Chamber Deep and quiet Deep and quiet   Iris Round and dilated Round and dilated   Lens PC IOL in good position PC IOL in good position   Vitreous Mild Vitreous syneresis, Posterior vitreous detachment Mild Vitreous syneresis       Fundus Exam       Right Left   Disc Compact, trace Pallor, mild tilt, temporal PPA Compact, severe tilt, inferior PPA   C/D Ratio  0.0   Macula Flat, Blunted foveal reflex, mild RPE mottling and clumping Flat, Blunted foveal reflex, RPE mottling and clumping, mild Epiretinal membrane   Vessels Vascular attenuation, Tortuous Vascular attenuation, Tortuous   Periphery Attached, pigmented peripheral cystoid degeneration Attached, pigmented peripheral cystoid degeneration        Refraction    Wearing Rx      Sphere   Right None   Left None       Manifest Refraction      Sphere Cylinder Axis Dist VA   Right -0.50 +0.75 090 20/25+1   Left -1.50 +1.00 060 20/25-2          IMAGING AND PROCEDURES  Imaging and Procedures for @TODAY @  OCT, Retina - OU - Both Eyes       Right Eye Quality was good. Central Foveal Thickness: 266. Progression has no prior data. Findings include normal foveal contour, no IRF, no SRF, myopic contour, epiretinal membrane.   Left Eye Quality was good. Central Foveal Thickness: 284. Progression has no prior data. 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 CME - cystoid macular edema. PED - pigment epithelial detachment. IRF - intraretinal fluid. SRF - subretinal fluid. EZ -  ellipsoid zone. ERM - epiretinal membrane. ORA - outer retinal atrophy. ORT - outer retinal tubulation. SRHM - subretinal hyper-reflective material                 ASSESSMENT/PLAN:    ICD-10-CM   1. History of myopia  Z86.69   2. Retinal edema  H35.81 OCT, Retina - OU - Both Eyes  3. Essential hypertension  I10   4. Hypertensive  retinopathy of both eyes  H35.033   5. Pseudophakia of both eyes  Z96.1    1. Hx of high myopia OU  - exam shows peripheral pigmented cystoid degeneration OU, but no RT/RD on peripheral exam  - pt denies flashes / floaters and has not visual complaints  - discussed association of high myopia with lattice degeneration and increased risk of RT/RD  - pt is cleared from a retina standpoint for release to Dr. Parke Simmers and resumption of primary eye care  2. No retinal edema on exam or OCT  - OS with mild inner retinoschisis nasal to disc caught on widefield OCT  - monitor  3,4. Hypertensive retinopathy OU  - discussed importance of tight BP control  - monitor  5. Pseudophakia OU (Dr. Herbert Deaner, OS: 12.22.20, OD: 03.02.21)  - s/p CE/IOL OU  - beautiful surgeries, doing well  - monitor   Ophthalmic Meds Ordered this visit:  No orders of the defined types were placed in this encounter.      Return if symptoms worsen or fail to improve.  There are no Patient Instructions on file for this visit.   Explained the diagnoses, plan, and follow up with the patient and they expressed understanding.  Patient expressed understanding of the importance of proper follow up care.   This document serves as a record of services personally performed by Gardiner Sleeper, MD, PhD. It was created on their behalf by Leeann Must, [place], a certified ophthalmic assistant. The creation of this record is the provider's dictation and/or activities during the visit.    Electronically signed by: Leeann Must, COA @TODAY @ 4:21 PM   This document serves as a record of services personally performed by Gardiner Sleeper, MD, PhD. It was created on their behalf by Ernest Mallick, OA, an ophthalmic assistant. The creation of this record is the provider's dictation and/or activities during the visit.    Electronically signed by: Ernest Mallick, OA 04.05.2021 4:21 PM   Gardiner Sleeper, M.D., Ph.D. Diseases & Surgery of the  Retina and Vitreous [facility]  I have reviewed the above documentation for accuracy and completeness, and I agree with the above. Gardiner Sleeper, M.D., Ph.D. 07/20/19 4:21 PM   Abbreviations: M myopia (nearsighted); A astigmatism; H hyperopia (farsighted); P presbyopia; Mrx spectacle prescription;  CTL contact lenses; OD right eye; OS left eye; OU both eyes  XT exotropia; ET esotropia; PEK punctate epithelial keratitis; PEE punctate epithelial erosions; DES dry eye syndrome; MGD meibomian gland dysfunction; ATs artificial tears; PFAT's preservative free artificial tears; [place] nuclear sclerotic cataract; PSC posterior subcapsular cataract; ERM epi-retinal membrane; PVD posterior vitreous detachment; RD retinal detachment; DM diabetes mellitus; DR diabetic retinopathy; NPDR non-proliferative diabetic retinopathy; PDR proliferative diabetic retinopathy; CSME clinically significant macular edema; DME diabetic macular edema; dbh dot blot hemorrhages; CWS cotton wool spot; POAG primary open angle glaucoma; C/D cup-to-disc ratio; HVF humphrey visual field; GVF goldmann visual field; OCT optical coherence tomography; IOP intraocular pressure; BRVO Branch retinal vein occlusion; CRVO central retinal vein occlusion;  CRAO central retinal artery occlusion; BRAO branch retinal artery occlusion; RT retinal tear; SB scleral buckle; PPV pars plana vitrectomy; VH Vitreous hemorrhage; PRP panretinal laser photocoagulation; IVK intravitreal kenalog; VMT vitreomacular traction; MH Macular hole;  NVD neovascularization of the disc; NVE neovascularization elsewhere; AREDS age related eye disease study; ARMD age related macular degeneration; POAG primary open angle glaucoma; EBMD epithelial/anterior basement membrane dystrophy; ACIOL anterior chamber intraocular lens; IOL intraocular lens; PCIOL posterior chamber intraocular lens; Phaco/IOL phacoemulsification with intraocular lens placement; [place]  photorefractive keratectomy; LASIK laser assisted in situ keratomileusis; HTN hypertension; DM diabetes mellitus; COPD chronic obstructive pulmonary disease

## 2019-07-17 NOTE — Progress Notes (Signed)
Attempted to call the patient to discuss CT scan, which shows no evidence of metastatic disease. No answer, left voicemail requesting call back.  Jeral Pinch MD

## 2019-07-20 ENCOUNTER — Telehealth: Payer: Self-pay | Admitting: Oncology

## 2019-07-20 ENCOUNTER — Ambulatory Visit: Payer: Medicare Other | Admitting: Hematology and Oncology

## 2019-07-20 ENCOUNTER — Encounter (INDEPENDENT_AMBULATORY_CARE_PROVIDER_SITE_OTHER): Payer: Self-pay | Admitting: Ophthalmology

## 2019-07-20 ENCOUNTER — Ambulatory Visit (INDEPENDENT_AMBULATORY_CARE_PROVIDER_SITE_OTHER): Payer: Medicare Other | Admitting: Ophthalmology

## 2019-07-20 DIAGNOSIS — H35033 Hypertensive retinopathy, bilateral: Secondary | ICD-10-CM | POA: Diagnosis not present

## 2019-07-20 DIAGNOSIS — H3581 Retinal edema: Secondary | ICD-10-CM | POA: Diagnosis not present

## 2019-07-20 DIAGNOSIS — Z8669 Personal history of other diseases of the nervous system and sense organs: Secondary | ICD-10-CM | POA: Diagnosis not present

## 2019-07-20 DIAGNOSIS — Z961 Presence of intraocular lens: Secondary | ICD-10-CM | POA: Diagnosis not present

## 2019-07-20 DIAGNOSIS — C541 Malignant neoplasm of endometrium: Secondary | ICD-10-CM

## 2019-07-20 DIAGNOSIS — I10 Essential (primary) hypertension: Secondary | ICD-10-CM | POA: Diagnosis not present

## 2019-07-20 NOTE — Telephone Encounter (Signed)
Called Heidi Avila and advised her that the life insurance paperwork is ready for pick up.  She will pick it up on Thursday.  Also discussed genetic counseling and she is interested.  Scheduled appointment on 08/03/19.

## 2019-07-21 ENCOUNTER — Ambulatory Visit: Payer: Medicare Other | Admitting: Gynecologic Oncology

## 2019-07-21 NOTE — Progress Notes (Signed)
GYN Location of Tumor / Histology: Endometrioid adenocarcinoma, 3.5 cm, FIGO grade 1   [place] presented with symptoms of: The patient was initially seen in early February with postmenopausal bleeding.  She was started on 10 mg of Provera daily.  She subsequently underwent endometrial biopsy as well as ultrasound.  Endometrial biopsy revealed complex atypical hyperplasia, possible focus suspicious for grade 1 endometrial cancer.  Patient reports having multiple episodes of postmenopausal bleeding since menopause in her mid 75s.  She has had these episodes before when she "overdoes it."  Around Christmas time 2020, she was working at the [employer] as well as working on [employer] that she has.  She began having bleeding that was fairly heavy but thought it would stop once she was not so busy and running around.  When things slow down, her bleeding got heavier which prompted her to be seen.  She has had some cramping since endometrial biopsy and her bleeding has almost stopped completely.  She started taking daily Provera the day after her biopsy.  Biopsies revealed: 06/30/19: FINAL MICROSCOPIC DIAGNOSIS:   A. UTERUS, CERVIX, BILATERAL FALLOPIAN TUBES AND OVARIES, HYSTERECTOMY  WITH SALPINGECTOMY:  - Endometrioid adenocarcinoma, 3.5 cm, FIGO grade 1  - Carcinoma invades through the entire myometrium and focally into the  serosal surface  - Benign unremarkable cervix  - Benign unremarkable bilateral fallopian tubes and left ovary  - Benign serous inclusion cyst of right ovary  - See oncology table   B. 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 LYMPH NODE, LEFT PELVIC, BIOPSY:  - Eight benign lymph nodes (0/8)     ONCOLOGY TABLE:   UTERUS, CARCINOMA OR CARCINOSARCOMA    Past/Anticipated interventions by Gyn/Onc  surgery, if any: 06/30/19: Operation: Robotic-assisted laparoscopic total hysterectomy with bilateral salpingoophorectomy, bilateral pelvic LND  Surgeon: Jeral Pinch MD  Past/Anticipated interventions by medical oncology, if any: Initial consult with Dr. Alvy Bimler 07/23/19  Weight changes, if any:  Wt Readings from Last 3 Encounters:  07/22/19 246 lb (111.6 kg)  06/30/19 242 lb (109.8 kg)  06/25/19 242 lb (109.8 kg)     Bowel/Bladder complaints, if any: Pt denies dysuria/hematuria. Pt denies vaginal bleeding. Pt reports vaginal discharge, "yellowish", with some odor. Pt denies rectal bleeding, diarrhea. Pt reports "a little constipation" and states that is normal.  Nausea/Vomiting, if any: Pt denies abdominal bloating, N/V.  Pain issues, if any:  Pt reports pain in RIGHT knee, rated 4/10. Pt states that knee needs replacement.   SAFETY ISSUES:  Prior radiation? no  Pacemaker/ICD? no  Possible current pregnancy? no  Is the patient on methotrexate? no  Current Complaints / other details:  Pt presents today for initial consult with Dr. Sondra Come. Pt is unaccompanied.   BP (!) 155/65 (BP Location: Left Arm, Patient Position: Sitting)   Pulse 79   Temp 98.7 F (37.1 C) (Temporal)   Resp 20   Ht 5\' 2"  (1.575 m)   Wt 246 lb (111.6 kg)   SpO2 93%   BMI 44.99 kg/m   Loma Sousa, RN BSN

## 2019-07-22 ENCOUNTER — Other Ambulatory Visit: Payer: Self-pay

## 2019-07-22 ENCOUNTER — Ambulatory Visit: Payer: Medicare Other | Admitting: Hematology and Oncology

## 2019-07-22 ENCOUNTER — Encounter: Payer: Self-pay | Admitting: Oncology

## 2019-07-22 ENCOUNTER — Encounter: Payer: Self-pay | Admitting: Radiation Oncology

## 2019-07-22 ENCOUNTER — Ambulatory Visit
Admission: RE | Admit: 2019-07-22 | Discharge: 2019-07-22 | Disposition: A | Discharge status: Home / Self Care | Payer: Medicare Other | Source: Ambulatory Visit | Attending: Radiation Oncology | Admitting: Radiation Oncology

## 2019-07-22 ENCOUNTER — Other Ambulatory Visit: Payer: Medicare Other

## 2019-07-22 VITALS — BP 155/65 | HR 79 | Temp 98.7°F | Resp 20 | Ht 62.0 in | Wt 246.0 lb

## 2019-07-22 DIAGNOSIS — H3581 Retinal edema: Secondary | ICD-10-CM | POA: Diagnosis not present

## 2019-07-22 DIAGNOSIS — Z90710 Acquired absence of both cervix and uterus: Secondary | ICD-10-CM | POA: Insufficient documentation

## 2019-07-22 DIAGNOSIS — E669 Obesity, unspecified: Secondary | ICD-10-CM | POA: Insufficient documentation

## 2019-07-22 DIAGNOSIS — C541 Malignant neoplasm of endometrium: Secondary | ICD-10-CM | POA: Insufficient documentation

## 2019-07-22 DIAGNOSIS — K219 Gastro-esophageal reflux disease without esophagitis: Secondary | ICD-10-CM | POA: Insufficient documentation

## 2019-07-22 DIAGNOSIS — R188 Other ascites: Secondary | ICD-10-CM | POA: Insufficient documentation

## 2019-07-22 DIAGNOSIS — Z800 Family history of malignant neoplasm of digestive organs: Secondary | ICD-10-CM | POA: Diagnosis not present

## 2019-07-22 DIAGNOSIS — K429 Umbilical hernia without obstruction or gangrene: Secondary | ICD-10-CM | POA: Diagnosis not present

## 2019-07-22 DIAGNOSIS — R0602 Shortness of breath: Secondary | ICD-10-CM | POA: Diagnosis not present

## 2019-07-22 DIAGNOSIS — K5730 Diverticulosis of large intestine without perforation or abscess without bleeding: Secondary | ICD-10-CM | POA: Diagnosis not present

## 2019-07-22 DIAGNOSIS — K449 Diaphragmatic hernia without obstruction or gangrene: Secondary | ICD-10-CM | POA: Diagnosis not present

## 2019-07-22 DIAGNOSIS — Z79899 Other long term (current) drug therapy: Secondary | ICD-10-CM | POA: Insufficient documentation

## 2019-07-22 DIAGNOSIS — C775 Secondary and unspecified malignant neoplasm of intrapelvic lymph nodes: Secondary | ICD-10-CM | POA: Diagnosis not present

## 2019-07-22 DIAGNOSIS — Z801 Family history of malignant neoplasm of trachea, bronchus and lung: Secondary | ICD-10-CM | POA: Diagnosis not present

## 2019-07-22 DIAGNOSIS — Z90722 Acquired absence of ovaries, bilateral: Secondary | ICD-10-CM | POA: Diagnosis not present

## 2019-07-22 DIAGNOSIS — I10 Essential (primary) hypertension: Secondary | ICD-10-CM | POA: Diagnosis not present

## 2019-07-22 DIAGNOSIS — C778 Secondary and unspecified malignant neoplasm of lymph nodes of multiple regions: Secondary | ICD-10-CM | POA: Diagnosis not present

## 2019-07-22 NOTE — Progress Notes (Signed)
Heidi Avila picked up her completed life insurance paperwork.

## 2019-07-22 NOTE — Progress Notes (Addendum)
Gynecologic Oncology Return Clinic Visit  07/23/19  Reason for Visit: post-operative follow-up and treatment planning  Treatment History: Oncology History Overview Note  Endometrioid, MSI high   Endometrial cancer ([place])  05/18/2019 Initial Biopsy   EMB: Complex atypical hyperplasia, cannot rule out microscopic focus of FIGO grade 1 carcinoma   05/18/2019 Initial Diagnosis   Presented in February 2021 with postmenopausal bleeding.  She was started on Provera and underwent endometrial biopsy showing complex atypical hyperplasia with a possible focus suspicious for grade 1 endometrial cancer.   05/21/2019 Imaging   Pelvic ultrasound at [facility]: Anteverted uterus measuring 8.2 cm in greatest dimension.  Endometrial lining measures 1.73 cm and is thickened.  Left ovary not visualized, right ovary normal in appearance.   06/30/2019 Surgery   Robotic-assisted laparoscopic total hysterectomy with bilateral salpingoophorectomy, bilateral pelvic LND  Frozen section showed endometrial cancer with more than 50% myometrial invasion.  Patient did not map to a sentinel lymph node bilaterally.   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  Tumor measures 3.5 cm and invades focally into the serosa.  No involvement of the cervix or bilateral adnexa.  1 of 20 lymph nodes positive for metastatic carcinoma, focus measures 1 cm without evidence of extracapsular extension.  FINAL MICROSCOPIC DIAGNOSIS: A. UTERUS, CERVIX, BILATERAL FALLOPIAN TUBES AND OVARIES, HYSTERECTOMY WITH SALPINGECTOMY: - Endometrioid adenocarcinoma, 3.5 cm, FIGO grade 1 - Carcinoma invades through the entire myometrium and focally into the serosal surface - Benign unremarkable cervix - Benign unremarkable bilateral fallopian tubes and left ovary - Benign serous inclusion cyst of right ovary - See oncology table B. LYMPH NODE, RIGHT PELVIC, BIOPSY: - Metastatic carcinoma to one of twelve  lymph nodes (1/12) - Focus of metastatic carcinoma measures 1.0 cm, without evidence of extracapsular extension C. LYMPH NODE, LEFT PELVIC, BIOPSY: - Eight benign lymph nodes (0/8) ONCOLOGY TABLE: UTERUS, CARCINOMA OR CARCINOSARCOMA Procedure: Total hysterectomy and bilateral salpingo-oophorectomy Histologic type: Endometrioid adenocarcinoma Histologic Grade: FIGO grade 1 Myometrial invasion: Depth of invasion: 30 mm Myometrial thickness: 30 mm Uterine Serosa Involvement: Present, focal Cervical stromal involvement: Not identified Extent of involvement of other organs: Not identified Lymphovascular invasion: Not identified Regional Lymph Nodes: Examined: 0 Sentinel 20 Non-sentinel 20 Total Lymph nodes with metastasis: 1 Isolated tumor cells (<0.2 mm): 0 Micrometastasis: (>0.2 mm and < 2.0 mm): 0 Macrometastasis: (>2.0 mm): 1 Extracapsular extension: Not identified Representative Tumor Block: A6 MMR / MSI testing: Will be ordered Pathologic Stage Classification (pTNM, AJCC 8th edition): pT3, pN1a Comments: None Mismatch Repair Protein (IHC) SUMMARY INTERPRETATION: ABNORMAL 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 The loss of expression may be secondary to promoter hyper-methylation, gene mutation or other genetic event. BRAF mutation testing and/or MLH1 methylation testing is indicated. The presence of a BRAF mutation and/or MLH1 hypermethylation is indicative of a sporadic-type tumor. The absence of either BRAF mutation and/or presence of normal methylation indicate the possible presence of a hereditary germline mutation (e.g.Lynch syndrome) and referral to genetic counseling is warranted. It is recommended that the loss of protein expression be correlated with molecular based MSI testing   06/30/2019 Cancer Staging   Staging form: Corpus Uteri - Carcinoma and Carcinosarcoma, AJCC 8th Edition - Clinical stage from 06/30/2019: FIGO Stage IIIC1 (cT3, cN1a, cM0)  - Signed by Lafonda Mosses, MD on 07/09/2019   07/16/2019 Imaging   1. No evidence of metastatic disease in the chest, abdomen or pelvis. 2.  Status post hysterectomy. Small volume simple ascites. Small amount of ill-defined fluid in the pelvic sidewalls, compatible with recent surgery. 3. Small umbilical hernia containing fat and trace ascitic fluid. 4. Chronic findings include: Aortic Atherosclerosis (ICD10-I70.0). Small hiatal hernia. Mild sigmoid diverticulosis     Interval History: Doing well since surgery.  She endorses a good appetite without nausea or emesis.  She is working on drinking more water and less sodas.  She has back to her baseline constipation then denies any urinary symptoms.  She has had some slight discharge which she describes as yellow and also baseline for her.  She denies any vaginal bleeding or odor.  She denies any fevers or chills.  Past Medical/Surgical History: Past Medical History:  Diagnosis Date  . Cataract   . Complex endometrial hyperplasia with atypia   . GERD (gastroesophageal reflux disease)   . Heart murmur    as a child  . Hypertension   . Obesity   . Pneumonia 2018  . Post-menopausal bleeding 05/18/2019    Past Surgical History:  Procedure Laterality Date  . BIOPSY ENDOMETRIAL    . CATARACT EXTRACTION  03/2019  . ESOPHAGOGASTRODUODENOSCOPY    . ROBOTIC ASSISTED TOTAL HYSTERECTOMY WITH BILATERAL SALPINGO OOPHERECTOMY Bilateral 06/30/2019   Procedure: XI ROBOTIC ASSISTED TOTAL HYSTERECTOMY WITH BILATERAL SALPINGO OOPHORECTOMY, BILATERAL LYMPH NODE DISSECTION;  Surgeon: Lafonda Mosses, MD;  Location: [facility];  Service: Gynecology;  Laterality: Bilateral;  . SENTINEL NODE BIOPSY N/A 06/30/2019   Procedure: SENTINEL LYMPH NODE BIOPSY;  Surgeon: Lafonda Mosses, MD;  Location: [facility];  Service: Gynecology;  Laterality: N/A;  . TONSILLECTOMY    . TOTAL HIP ARTHROPLASTY Left   . WISDOM TOOTH EXTRACTION      Family History  Problem  Relation Age of Onset  . Colon cancer Mother 36       treated with surgery only  . Lung cancer Mother   . CVA Father   . Endometrial cancer Neg Hx   . Ovarian cancer Neg Hx   . Breast cancer Neg Hx     Social History   Socioeconomic History  . Marital status: Divorced    Spouse name: Not on file  . Number of children: Not on file  . Years of education: Not on file  . Highest education level: Not on file  Occupational History  . Not on file  Tobacco Use  . Smoking status: Never Smoker  . Smokeless tobacco: Never Used  Substance and Sexual Activity  . Alcohol use: No  . Drug use: Never  . Sexual activity: Not Currently  Other Topics Concern  . Not on file  Social History Narrative  . Not on file   Social Determinants of Health   Financial Resource Strain:   . Difficulty of Paying Living Expenses:   Food Insecurity:   . Worried About Charity fundraiser in the Last Year:   . Arboriculturist in the Last Year:   Transportation Needs:   . Film/video editor (Medical):   Marland Kitchen Lack of Transportation (Non-Medical):   Physical Activity:   . Days of Exercise per Week:   . Minutes of Exercise per Session:   Stress:   . Feeling of Stress :   Social Connections:   . Frequency of Communication with Friends and Family:   . Frequency of Social Gatherings with Friends and Family:   . Attends Religious Services:   . Active Member of Clubs or Organizations:   .  Attends Archivist Meetings:   Marland Kitchen Marital Status:     Current Medications:  Current Outpatient Medications:  .  AMITIZA 24 MCG capsule, Take 48 mcg by mouth every evening. , Disp: , Rfl:  .  B Complex Vitamins (B COMPLEX PO), Take 1 Dose by mouth daily. Liquid b complex, Disp: , Rfl:  .  benazepril-hydrochlorthiazide (LOTENSIN HCT) 20-25 MG tablet, Take 1 tablet by mouth every evening. , Disp: , Rfl:  .  brimonidine (ALPHAGAN) 0.2 % ophthalmic solution, Place 1 drop into the right eye in the morning and at  bedtime., Disp: , Rfl:  .  diphenhydrAMINE (BENADRYL) 25 MG tablet, Take 50 mg by mouth daily as needed for allergies., Disp: , Rfl:  .  gabapentin (NEURONTIN) 600 MG tablet, Take 600-1,200 mg by mouth See admin instructions. Take 1200 mg in the morning, 600 mg in the afternoon, and 600 mg at night, Disp: , Rfl:  .  ketorolac (ACULAR) 0.5 % ophthalmic solution, Place 1 drop into the right eye 4 (four) times daily. , Disp: , Rfl:  .  Melatonin 5 MG CAPS, Take 5 mg by mouth at bedtime., Disp: , Rfl:  .  meloxicam (MOBIC) 15 MG tablet, Take 15 mg by mouth daily in the afternoon. , Disp: , Rfl:  .  methocarbamol (ROBAXIN) 500 MG tablet, Take 1,000 mg by mouth 2 (two) times daily as needed., Disp: , Rfl:  .  ofloxacin (OCUFLOX) 0.3 % ophthalmic solution, Place 1 drop into the right eye 4 (four) times daily. , Disp: , Rfl:  .  omeprazole (PRILOSEC OTC) 20 MG tablet, Take 20 mg by mouth every evening., Disp: , Rfl:  .  oxyCODONE (OXY IR/ROXICODONE) 5 MG immediate release tablet, Take 1 tablet (5 mg total) by mouth every 4 (four) hours as needed for severe pain. Do not take with tramadol and do not take and drive, Disp: 10 tablet, Rfl: 0 .  Oxymetazoline HCl (VICKS SINEX 12 HOUR NA), Place 1 spray into the nose 2 (two) times daily as needed (congestion)., Disp: , Rfl:  .  prednisoLONE acetate (PRED FORTE) 1 % ophthalmic suspension, Place 1 drop into the right eye every 2 (two) hours., Disp: , Rfl:  .  senna-docusate (SENOKOT-S) 8.6-50 MG tablet, Take 2 tablets by mouth at bedtime. For AFTER surgery, do not take if having diarrhea, Disp: 30 tablet, Rfl: 1 .  tiZANidine (ZANAFLEX) 4 MG tablet, Take 4 mg by mouth at bedtime., Disp: , Rfl:  .  topiramate (TOPAMAX) 25 MG tablet, Take 75 mg by mouth at bedtime., Disp: , Rfl:  .  traMADol (ULTRAM) 50 MG tablet, Take 1 tablet (50 mg total) by mouth every 6 (six) hours as needed for severe pain. For AFTER surgery, do not take and drive, Disp: 10 tablet, Rfl:  0  Review of Systems: Denies appetite changes, fevers, chills, fatigue, unexplained weight changes. Denies hearing loss, neck lumps or masses, mouth sores, ringing in ears or voice changes. Denies cough or wheezing.  Denies shortness of breath. Denies chest pain or palpitations. Denies leg swelling. Denies abdominal distention, pain, blood in stools, constipation, diarrhea, nausea, vomiting, or early satiety. Denies pain with intercourse, dysuria, frequency, hematuria or incontinence. Denies hot flashes, pelvic pain, vaginal bleeding.  Denies joint pain, back pain or muscle pain/cramps. Denies itching, rash, or wounds. Denies dizziness, headaches, numbness or seizures. Denies swollen lymph nodes or glands, denies easy bruising or bleeding. Denies anxiety, depression, confusion, or decreased concentration.  Physical  Exam: BP 139/65 (BP Location: Left Arm)   Pulse 74   Temp 99.1 F (37.3 C) (Temporal)   Ht 5' 4"  (1.626 m)   Wt 246 lb 8 oz (111.8 kg)   SpO2 96%   BMI 42.31 kg/m  General: Alert, oriented, no acute distress. HEENT: Atraumatic, normocephalic, sclera anicteric. Chest: Unlabored breathing on room air.. Abdomen: Obese, soft, nontender.  Normoactive bowel sounds.  No masses or hepatosplenomegaly appreciated.  Well-healing laparoscopic incisions. Extremities: Grossly normal range of motion.  Warm, well perfused.  Trace edema bilaterally. GU: Normal appearing external genitalia without erythema, excoriation, or lesions.  Speculum exam reveals no discharge or blood, moderately atrophic vaginal mucosa, cuff intact with no sutures visible.  Bimanual exam reveals cuff intact, no masses or fluctuance.    Laboratory & Radiologic Studies: None new  MSI pending  Assessment & Plan: Heidi Avila is a 74 y.o. 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  Patient is doing very  well from a surgical standpoint.  We discussed continued activity restriction until she is 6-8 weeks out.  She is meeting with medical oncology today and so radiation oncology yesterday.  Based on our discussion at tumor board, we recommended systemic treatment with carboplatin and paclitaxel as well as vaginal brachytherapy.  If the patient is unable to tolerate chemotherapy secondary to side effects and comorbidities, then in the setting of MMR deficiency, we also discussed external beam radiation with pembrolizumab.  IHC was abnormal for MMR proteins, MSI is pending.  15 minutes of total time was spent for this patient encounter, including preparation, face-to-face counseling with the patient and coordination of care, and documentation of the encounter.  Heidi Pinch, MD  Division of Gynecologic Oncology  Department of Obstetrics and Gynecology  [facility]    Addendum: Received report from pathology showing MSI-high.  MLH1 promoter hyper methylation test was ordered and present.

## 2019-07-22 NOTE — Patient Instructions (Signed)
Coronavirus (COVID-19) Are you at risk?  Are you at risk for the Coronavirus (COVID-19)?  To be considered HIGH RISK for Coronavirus (COVID-19), you have to meet the following criteria:  . Traveled to [place], [place], [place], [place] or [place]; or in the [place] to [place], [place], [place], or [place]; and have fever, cough, and shortness of breath within the last 2 weeks of travel OR . Been in close contact with a person diagnosed with COVID-19 within the last 2 weeks and have fever, cough, and shortness of breath . IF YOU DO NOT MEET THESE CRITERIA, YOU ARE CONSIDERED LOW RISK FOR COVID-19.  What to do if you are HIGH RISK for COVID-19?  . If you are having a medical emergency, call 911. . Seek medical care right away. Before you go to a doctor's office, urgent care or emergency department, call ahead and tell them about your recent travel, contact with someone diagnosed with COVID-19, and your symptoms. You should receive instructions from your physician's office regarding next steps of care.  . When you arrive at healthcare provider, tell the healthcare staff immediately you have returned from visiting [place], [place], [place], [place] or [place]; or traveled in the [place] to [place], [place], [place], or [place]; in the last two weeks or you have been in close contact with a person diagnosed with COVID-19 in the last 2 weeks.   . Tell the health care staff about your symptoms: fever, cough and shortness of breath. . After you have been seen by a medical provider, you will be either: o Tested for (COVID-19) and discharged home on quarantine except to seek medical care if symptoms worsen, and asked to  - Stay home and avoid contact with others until you get your results (4-5 days)  - Avoid travel on public transportation if possible (such as bus, train, or airplane) or o Sent to the Emergency Department by EMS for evaluation, COVID-19 testing, and possible  admission depending on your condition and test results.  What to do if you are LOW RISK for COVID-19?  Reduce your risk of any infection by using the same precautions used for avoiding the common cold or flu:  . Wash your hands often with soap and warm water for at least 20 seconds.  If soap and water are not readily available, use an alcohol-based hand sanitizer with at least 60% alcohol.  . If coughing or sneezing, cover your mouth and nose by coughing or sneezing into the elbow areas of your shirt or coat, into a tissue or into your sleeve (not your hands). . Avoid shaking hands with others and consider head nods or verbal greetings only. . Avoid touching your eyes, nose, or mouth with unwashed hands.  . Avoid close contact with people who are sick. . Avoid places or events with large numbers of people in one location, like concerts or sporting events. . Carefully consider travel plans you have or are making. . If you are planning any travel outside or inside the [place], visit the CDC's Travelers' Health webpage for the latest health notices. . If you have some symptoms but not all symptoms, continue to monitor at home and seek medical attention if your symptoms worsen. . If you are having a medical emergency, call 911.   ADDITIONAL HEALTHCARE OPTIONS FOR PATIENTS  [facility] / e-Visit: [email or web address]         [facility]: [phone]  [facility]: [phone]                   [facility]: [phone]   

## 2019-07-22 NOTE — Progress Notes (Signed)
Radiation Oncology         [phone] ________________________________  Initial Outpatient Consultation  Name: Heidi Avila MRN: [id number]  Date: 07/22/2019  DOB: 1945-07-14  [id number], No Pcp Per  Lafonda Mosses, MD   REFERRING PHYSICIAN: Lafonda Mosses, MD  DIAGNOSIS: The encounter diagnosis was Endometrial cancer [facility]).  74 y.o. woman with Stage IIIC1grade 1 endometrioid endometrial adenocarcinoma  HISTORY OF PRESENT ILLNESS::Heidi Avila is a 74 y.o. female who is accompanied by no one. The patient presented to Delice Bison, NP, in early February of 2021 with complaint of intermittent postmenopausal bleeding for approximately 30 years that worsened in December of 2020. She was started on 10 mg Provera daily and underwent an endometrial biopsy and ultrasound that revealed complex atypical hyperplasia, possible focus suspicious for grade 1 endometrial cancer.  The patient was referred to Dr. Berline Lopes and was seen on 06/01/2019. During that time, they discussed the diagnosis and treatment options. The patient desired to proceed with surgical management.  The patient underwent a robotic-assisted total hysterectomy with bilateral salpingo-oophorectomy, and bilateral pelvic LND on 06/30/2019 performed by Dr. Berline Lopes. Pathology from the procedure showed FIGO grade 1 endometrioid adenocarcinoma invading through the entire myometrium and focally into the serosal surface. Cervix, bilateral fallopian tubes, left ovary, and serous inclusion cyst of right ovary well all benign. Twelve pelvic lymph nodes were biopsied and one was right found to have metastatic carcinoma without evidence of extracapsular extension. Eight left pelvic lymph nodes were biopsied and were all benign.  CT of chest/abdomen/pelvis on 07/16/2019 did not show any evidence of metastatic disease in the chest, abdomen, or pelvis.  PREVIOUS RADIATION THERAPY: No  PAST MEDICAL HISTORY:  Past Medical History:    Diagnosis Date  . Cancer ([place]) 06/2019  . Cataract   . Complex endometrial hyperplasia with atypia   . Dyspnea   . GERD (gastroesophageal reflux disease)   . Heart murmur    as a child  . Hypertension   . Obesity   . Pneumonia 2018  . Post-menopausal bleeding 05/18/2019    PAST SURGICAL HISTORY: Past Surgical History:  Procedure Laterality Date  . BIOPSY ENDOMETRIAL    . CATARACT EXTRACTION  03/2019  . ESOPHAGOGASTRODUODENOSCOPY    . IR IMAGING GUIDED PORT INSERTION  07/29/2019  . ROBOTIC ASSISTED TOTAL HYSTERECTOMY WITH BILATERAL SALPINGO OOPHERECTOMY Bilateral 06/30/2019   Procedure: XI ROBOTIC ASSISTED TOTAL HYSTERECTOMY WITH BILATERAL SALPINGO OOPHORECTOMY, BILATERAL LYMPH NODE DISSECTION;  Surgeon: Lafonda Mosses, MD;  Location: [facility];  Service: Gynecology;  Laterality: Bilateral;  . SENTINEL NODE BIOPSY N/A 06/30/2019   Procedure: SENTINEL LYMPH NODE BIOPSY;  Surgeon: Lafonda Mosses, MD;  Location: [facility];  Service: Gynecology;  Laterality: N/A;  . TONSILLECTOMY    . TOTAL HIP ARTHROPLASTY Left   . WISDOM TOOTH EXTRACTION      FAMILY HISTORY:  Family History  Problem Relation Age of Onset  . Colon cancer Mother 69       treated with surgery only  . Lung cancer Mother   . CVA Father   . Endometrial cancer Neg Hx   . Ovarian cancer Neg Hx   . Breast cancer Neg Hx     SOCIAL HISTORY:  Social History   Tobacco Use  . Smoking status: Never Smoker  . Smokeless tobacco: Never Used  Substance Use Topics  . Alcohol use: No  . Drug use: Never    ALLERGIES:  Allergies  Allergen Reactions  . Amoxicillin Rash  Did it involve swelling of the face/tongue/throat, SOB, or low BP? No Did it involve sudden or severe rash/hives, skin peeling, or any reaction on the inside of your mouth or nose? No Did you need to seek medical attention at a hospital or doctor's office? No When did it last happen?7 + years If all above answers are "NO", may proceed with  cephalosporin use.   . Latex Rash    MEDICATIONS:  Current Outpatient Medications  Medication Sig Dispense Refill  . AMITIZA 24 MCG capsule Take 48 mcg by mouth every evening.     . B Complex Vitamins (B COMPLEX PO) Take 1 Dose by mouth daily. Liquid b complex    . benazepril-hydrochlorthiazide (LOTENSIN HCT) 20-25 MG tablet Take 1 tablet by mouth every evening.     . diphenhydrAMINE (BENADRYL) 25 MG tablet Take 50 mg by mouth daily as needed for allergies.    Marland Kitchen gabapentin (NEURONTIN) 600 MG tablet Take 600-1,200 mg by mouth See admin instructions. Take 1200 mg in the morning, 600 mg in the afternoon, and 600 mg at night    . Melatonin 5 MG CAPS Take 5 mg by mouth at bedtime.    . meloxicam (MOBIC) 15 MG tablet Take 15 mg by mouth daily in the afternoon.     . methocarbamol (ROBAXIN) 500 MG tablet Take 1,000 mg by mouth 2 (two) times daily as needed.    Marland Kitchen ofloxacin (OCUFLOX) 0.3 % ophthalmic solution Place 1 drop into the right eye 4 (four) times daily.     Marland Kitchen omeprazole (PRILOSEC OTC) 20 MG tablet Take 20 mg by mouth every evening.    Marland Kitchen oxyCODONE (OXY IR/ROXICODONE) 5 MG immediate release tablet Take 1 tablet (5 mg total) by mouth every 4 (four) hours as needed for severe pain. Do not take with tramadol and do not take and drive 10 tablet 0  . Oxymetazoline HCl (VICKS SINEX 12 HOUR NA) Place 1 spray into the nose 2 (two) times daily as needed (congestion).    Marland Kitchen tiZANidine (ZANAFLEX) 4 MG tablet Take 4 mg by mouth at bedtime.    . topiramate (TOPAMAX) 25 MG tablet Take 75 mg by mouth at bedtime.    Marland Kitchen dexamethasone (DECADRON) 4 MG tablet Take 2 tabs at the night before and 2 tab the morning of chemotherapy, every 3 weeks, by mouth x 6 cycles total 24 tablet 0  . lidocaine-prilocaine (EMLA) cream Apply to affected area once 30 g 3  . ondansetron (ZOFRAN) 8 MG tablet Take 1 tablet (8 mg total) by mouth every 8 (eight) hours as needed. Start on the third day after chemotherapy. 30 tablet 1  .  prochlorperazine (COMPAZINE) 10 MG tablet Take 1 tablet (10 mg total) by mouth every 6 (six) hours as needed (Nausea or vomiting). 30 tablet 1  . traMADol (ULTRAM) 50 MG tablet Take 1 tablet (50 mg total) by mouth every 6 (six) hours as needed for severe pain. For AFTER surgery, do not take and drive 10 tablet 0   No current facility-administered medications for this encounter.    REVIEW OF SYSTEMS:  A 10+ POINT REVIEW OF SYSTEMS WAS OBTAINED including neurology, dermatology, psychiatry, cardiac, respiratory, lymph, extremities, GI, GU, musculoskeletal, constitutional, reproductive, HEENT.  She denies any pelvic pain or vaginal bleeding.   PHYSICAL EXAM:  height is 5' 2"  (1.575 m) and weight is 246 lb (111.6 kg). Her temporal temperature is 98.7 F (37.1 C). Her blood pressure is 155/65 (abnormal) and her  pulse is 79. Her respiration is 20 and oxygen saturation is 93%.   General: Alert and oriented, in no acute distress HEENT: Head is normocephalic. Extraocular movements are intact. . Neck: Neck is supple, no palpable cervical or supraclavicular lymphadenopathy. Heart: Regular in rate and rhythm with no murmurs, rubs, or gallops. Chest: Clear to auscultation bilaterally, with no rhonchi, wheezes, or rales. Abdomen: Soft, nontender, nondistended, with no rigidity or guarding. Extremities: No cyanosis or edema. Lymphatics: see Neck Exam Skin: No concerning lesions. Musculoskeletal: symmetric strength and muscle tone throughout. Neurologic: Cranial nerves II through XII are grossly intact. No obvious focalities. Speech is fluent. Coordination is intact. Psychiatric: Judgment and insight are intact. Affect is appropriate. Pelvic exam deferred in light of recent surgery  ECOG = 1  0 - Asymptomatic (Fully active, able to carry on all predisease activities without restriction)  1 - Symptomatic but completely ambulatory (Restricted in physically strenuous activity but ambulatory and able to carry  out work of a light or sedentary nature. For example, light housework, office work)  2 - Symptomatic, <50% in bed during the day (Ambulatory and capable of all self care but unable to carry out any work activities. Up and about more than 50% of waking hours)  3 - Symptomatic, >50% in bed, but not bedbound (Capable of only limited self-care, confined to bed or chair 50% or more of waking hours)  4 - Bedbound (Completely disabled. Cannot carry on any self-care. Totally confined to bed or chair)  5 - Death   Eustace Pen MM, Creech RH, Tormey DC, et al. [phone]). "Toxicity and response criteria of the [facility]". [place]. 5 (6): 649-55  LABORATORY DATA:  Lab Results  Component Value Date   WBC 6.9 07/29/2019   HGB 13.2 07/29/2019   HCT 40.2 07/29/2019   MCV 91.0 07/29/2019   PLT 305 07/29/2019   NEUTROABS 4.4 07/29/2019   Lab Results  Component Value Date   NA 140 07/29/2019   K 3.4 (L) 07/29/2019   CL 103 07/29/2019   CO2 27 07/29/2019   GLUCOSE 110 (H) 07/29/2019   CREATININE 1.22 (H) 07/29/2019   CALCIUM 9.3 07/29/2019      RADIOGRAPHY: CT Chest W Contrast  Result Date: 07/16/2019 CLINICAL DATA:  Recent diagnosis of endometrial cancer, status post [facility] 06/30/2019. Staging evaluation. EXAM: CT CHEST, ABDOMEN, AND PELVIS WITH CONTRAST TECHNIQUE: Multidetector CT imaging of the chest, abdomen and pelvis was performed following the standard protocol during bolus administration of intravenous contrast. CONTRAST:  71m OMNIPAQUE IOHEXOL 300 MG/ML  SOLN COMPARISON:  06/25/2019 chest radiograph. FINDINGS: CT CHEST FINDINGS Cardiovascular: Normal heart size. No significant pericardial effusion/thickening. Great vessels are normal in course and caliber. No central pulmonary emboli. Mediastinum/Nodes: No discrete thyroid nodules. Unremarkable esophagus. No pathologically enlarged axillary, mediastinal or hilar lymph nodes. Lungs/Pleura: No pneumothorax. No pleural  effusion. No acute consolidative airspace disease, lung masses or significant pulmonary nodules. Upper abdomen: No acute abnormality. Musculoskeletal: No aggressive appearing focal osseous lesions. Mild thoracic spondylosis. CT ABDOMEN PELVIS FINDINGS Hepatobiliary: Normal liver with no liver mass. Normal gallbladder with no radiopaque cholelithiasis. No biliary ductal dilatation. Pancreas: Normal, with no mass or duct dilation. Spleen: Normal size. No mass. Adrenals/Urinary Tract: Normal adrenals. Subcentimeter hypodense renal cortical lesion in the lower right kidney, too small to characterize, requiring no follow-up. Otherwise normal kidneys, with no hydronephrosis. Nondistended bladder obscured by streak artifact from left hip hardware. Bladder is grossly normal. Stomach/Bowel: Small hiatal hernia. Otherwise  normal stomach. Normal caliber small bowel with no small bowel wall thickening. Candidate normal appendix. Mild sigmoid diverticulosis, with no large bowel wall thickening or significant pericolonic fat stranding. Oral contrast transits to the right colon. Vascular/Lymphatic: Mildly atherosclerotic nonaneurysmal abdominal aorta. Patent portal, splenic, hepatic and renal veins. No pathologically enlarged lymph nodes in the abdomen or pelvis. Reproductive: Status post hysterectomy. Hysterectomy margin obscured by streak artifact, with no discrete mass or fluid collection. No adnexal masses. Other: No pneumoperitoneum. Small volume simple ascites, most prominent in the pelvis. Small amount of simple ill-defined fluid in the pelvic sidewalls. Small umbilical hernia containing fat and trace ascitic fluid. Musculoskeletal: No aggressive appearing focal osseous lesions. Left hip arthroplasty. Mild lumbar spondylosis. IMPRESSION: 1. No evidence of metastatic disease in the chest, abdomen or pelvis. 2. Status post hysterectomy. Small volume simple ascites. Small amount of ill-defined fluid in the pelvic sidewalls,  compatible with recent surgery. 3. Small umbilical hernia containing fat and trace ascitic fluid. 4. Chronic findings include: Aortic Atherosclerosis (ICD10-I70.0). Small hiatal hernia. Mild sigmoid diverticulosis. Electronically Signed   By: Ilona Sorrel M.D.   On: 07/16/2019 20:43   CT Abdomen Pelvis W Contrast  Result Date: 07/16/2019 CLINICAL DATA:  Recent diagnosis of endometrial cancer, status post [facility] 06/30/2019. Staging evaluation. EXAM: CT CHEST, ABDOMEN, AND PELVIS WITH CONTRAST TECHNIQUE: Multidetector CT imaging of the chest, abdomen and pelvis was performed following the standard protocol during bolus administration of intravenous contrast. CONTRAST:  36m OMNIPAQUE IOHEXOL 300 MG/ML  SOLN COMPARISON:  06/25/2019 chest radiograph. FINDINGS: CT CHEST FINDINGS Cardiovascular: Normal heart size. No significant pericardial effusion/thickening. Great vessels are normal in course and caliber. No central pulmonary emboli. Mediastinum/Nodes: No discrete thyroid nodules. Unremarkable esophagus. No pathologically enlarged axillary, mediastinal or hilar lymph nodes. Lungs/Pleura: No pneumothorax. No pleural effusion. No acute consolidative airspace disease, lung masses or significant pulmonary nodules. Upper abdomen: No acute abnormality. Musculoskeletal: No aggressive appearing focal osseous lesions. Mild thoracic spondylosis. CT ABDOMEN PELVIS FINDINGS Hepatobiliary: Normal liver with no liver mass. Normal gallbladder with no radiopaque cholelithiasis. No biliary ductal dilatation. Pancreas: Normal, with no mass or duct dilation. Spleen: Normal size. No mass. Adrenals/Urinary Tract: Normal adrenals. Subcentimeter hypodense renal cortical lesion in the lower right kidney, too small to characterize, requiring no follow-up. Otherwise normal kidneys, with no hydronephrosis. Nondistended bladder obscured by streak artifact from left hip hardware. Bladder is grossly normal. Stomach/Bowel: Small hiatal hernia.  Otherwise normal stomach. Normal caliber small bowel with no small bowel wall thickening. Candidate normal appendix. Mild sigmoid diverticulosis, with no large bowel wall thickening or significant pericolonic fat stranding. Oral contrast transits to the right colon. Vascular/Lymphatic: Mildly atherosclerotic nonaneurysmal abdominal aorta. Patent portal, splenic, hepatic and renal veins. No pathologically enlarged lymph nodes in the abdomen or pelvis. Reproductive: Status post hysterectomy. Hysterectomy margin obscured by streak artifact, with no discrete mass or fluid collection. No adnexal masses. Other: No pneumoperitoneum. Small volume simple ascites, most prominent in the pelvis. Small amount of simple ill-defined fluid in the pelvic sidewalls. Small umbilical hernia containing fat and trace ascitic fluid. Musculoskeletal: No aggressive appearing focal osseous lesions. Left hip arthroplasty. Mild lumbar spondylosis. IMPRESSION: 1. No evidence of metastatic disease in the chest, abdomen or pelvis. 2. Status post hysterectomy. Small volume simple ascites. Small amount of ill-defined fluid in the pelvic sidewalls, compatible with recent surgery. 3. Small umbilical hernia containing fat and trace ascitic fluid. 4. Chronic findings include: Aortic Atherosclerosis (ICD10-I70.0). Small hiatal hernia. Mild sigmoid diverticulosis. Electronically Signed  By: Ilona Sorrel M.D.   On: 07/16/2019 20:43   OCT, Retina - OU - Both Eyes  Result Date: 07/20/2019 Right Eye Quality was good. Central Foveal Thickness: 266. Progression has no prior data. Findings include normal foveal contour, no IRF, no SRF, myopic contour, epiretinal membrane. Left Eye Quality was good. Central Foveal Thickness: 284. Progression has no prior data. 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 CME - cystoid macular edema. PED - pigment epithelial detachment. IRF - intraretinal fluid. SRF - subretinal fluid. EZ - ellipsoid zone. ERM - epiretinal membrane. ORA - outer retinal atrophy. ORT - outer retinal tubulation. SRHM - subretinal hyper-reflective material   IR IMAGING GUIDED PORT INSERTION  Result Date: 07/29/2019 CLINICAL DATA:  ENDOMETRIAL CANCER, ACCESS FOR CHEMOTHERAPY EXAM: RIGHT INTERNAL JUGULAR SINGLE LUMEN POWER PORT CATHETER INSERTION Date:  07/29/2019 07/29/2019 3:08 pm Radiologist:  M. Daryll Brod, MD Guidance:  Ultrasound fluoroscopic MEDICATIONS: 900 mg Cleocin; The antibiotic was administered within an appropriate time interval prior to skin puncture. ANESTHESIA/SEDATION: Versed 2.0 mg IV; Fentanyl 100 mcg IV; Moderate Sedation Time:  21 minutes The patient was continuously monitored during the procedure by the interventional radiology nurse under my direct supervision. FLUOROSCOPY TIME:  0 minutes, 28 seconds (7 mGy) COMPLICATIONS: None immediate. CONTRAST:  None. PROCEDURE: Informed consent was obtained from the patient following explanation of the procedure, risks, benefits and alternatives. The patient understands, agrees and consents for the procedure. All questions were addressed. A time out was performed. Maximal barrier sterile technique utilized including caps, mask, sterile gowns, sterile gloves, large sterile drape, hand hygiene, and 2% chlorhexidine scrub. Under sterile conditions and local anesthesia, right internal jugular micropuncture venous access was performed. Access was performed with ultrasound. Images were obtained for documentation of the patent right internal jugular vein. A guide wire was inserted followed by a transitional dilator. This allowed insertion of a guide  wire and catheter into the IVC. Measurements were obtained from the SVC / RA junction back to the right IJ venotomy site. In the right infraclavicular chest, a subcutaneous pocket was created over the second anterior rib. This was done under sterile conditions and local anesthesia. 1% lidocaine with epinephrine was utilized for this. A 2.5 cm incision was made in the skin. Blunt dissection was performed to create a subcutaneous pocket over the right pectoralis major muscle. The pocket was flushed with saline vigorously. There was adequate hemostasis. The port catheter was assembled and checked for leakage. The port catheter was secured in the pocket with two retention sutures. The tubing was tunneled subcutaneously to the right venotomy site and inserted into the SVC/RA junction through a valved peel-away sheath. Position was confirmed with fluoroscopy. Images were obtained for documentation. The patient tolerated the procedure well. No immediate complications. Incisions were closed in a two layer fashion with 4 - 0 Vicryl suture. Dermabond was applied to the skin. The port catheter was accessed, blood was aspirated followed by saline and heparin flushes. Needle was removed. A dry sterile dressing was applied. IMPRESSION: Ultrasound and fluoroscopically guided right internal jugular single lumen power port catheter insertion. Tip in the SVC/RA junction. Catheter ready for use. Electronically Signed   By: Jerilynn Mages.  Shick M.D.   On: 07/29/2019 15:23      IMPRESSION: 74 y.o. woman with Stage IIIC1grade 1 endometrioid endometrial adenocarcinoma  Based on the pathologic findings the patient will be at risk for vaginal cuff recurrence would recommend vaginal brachytherapy as a component of her adjuvant treatment.   Based on our discussion at tumor board, we recommended systemic treatment with carboplatin and paclitaxel as well as vaginal brachytherapy.  If the patient is unable to tolerate chemotherapy secondary to side  effects and comorbidities, then in the setting of MMR deficiency, we also discussed external beam radiation with pembrolizumab.  Today, I talked to the patient  about the findings and work-up thus far.  We discussed the natural history of endometrial cancer and general treatment, highlighting the role of radiotherapy in the management.  We discussed the available radiation techniques (brachytherapy), and focused on the details of logistics and delivery.  We reviewed the anticipated acute and late sequelae associated with radiation in this setting.  The patient was encouraged to ask questions that I answered to the best of my ability.  A patient consent form was discussed and signed.  We retained a copy for our records.    PLAN: Patient will proceed with adjuvant chemotherapy under the direction of Dr. Alvy Bimler.  After the second or third cycle of chemotherapy patient will proceed with vaginal brachytherapy.  Anticipate 5 high-dose-rate treatments directed at the vaginal cuff using iridium 192 is the high-dose-rate source.    ------------------------------------------------  [name], PhD, MD  This document serves as a record of services personally performed by Gery Pray, MD. It was created on his behalf by Clerance Lav, a trained medical scribe. The creation of this record is based on the scribe's personal observations and the provider's statements to them. This document has been checked and approved by the attending provider.

## 2019-07-23 ENCOUNTER — Inpatient Hospital Stay (HOSPITAL_BASED_OUTPATIENT_CLINIC_OR_DEPARTMENT_OTHER): Payer: Medicare Other | Admitting: Hematology and Oncology

## 2019-07-23 ENCOUNTER — Inpatient Hospital Stay: Payer: Medicare Other

## 2019-07-23 ENCOUNTER — Encounter: Payer: Self-pay | Admitting: Oncology

## 2019-07-23 ENCOUNTER — Encounter: Payer: Self-pay | Admitting: Gynecologic Oncology

## 2019-07-23 ENCOUNTER — Other Ambulatory Visit: Payer: Medicare Other

## 2019-07-23 ENCOUNTER — Inpatient Hospital Stay (HOSPITAL_BASED_OUTPATIENT_CLINIC_OR_DEPARTMENT_OTHER): Payer: Medicare Other | Admitting: Gynecologic Oncology

## 2019-07-23 VITALS — BP 154/64 | HR 81 | Temp 99.1°F | Resp 18 | Ht 64.0 in | Wt 247.8 lb

## 2019-07-23 VITALS — BP 139/65 | HR 74 | Temp 99.1°F | Ht 64.0 in | Wt 246.5 lb

## 2019-07-23 DIAGNOSIS — N179 Acute kidney failure, unspecified: Secondary | ICD-10-CM

## 2019-07-23 DIAGNOSIS — C541 Malignant neoplasm of endometrium: Secondary | ICD-10-CM

## 2019-07-23 DIAGNOSIS — Z90722 Acquired absence of ovaries, bilateral: Secondary | ICD-10-CM | POA: Diagnosis not present

## 2019-07-23 DIAGNOSIS — G629 Polyneuropathy, unspecified: Secondary | ICD-10-CM | POA: Diagnosis not present

## 2019-07-23 DIAGNOSIS — K5909 Other constipation: Secondary | ICD-10-CM | POA: Diagnosis not present

## 2019-07-23 DIAGNOSIS — N189 Chronic kidney disease, unspecified: Secondary | ICD-10-CM | POA: Diagnosis not present

## 2019-07-23 DIAGNOSIS — R897 Abnormal histological findings in specimens from other organs, systems and tissues: Secondary | ICD-10-CM

## 2019-07-23 DIAGNOSIS — C775 Secondary and unspecified malignant neoplasm of intrapelvic lymph nodes: Secondary | ICD-10-CM | POA: Diagnosis not present

## 2019-07-23 DIAGNOSIS — Z5111 Encounter for antineoplastic chemotherapy: Secondary | ICD-10-CM | POA: Diagnosis not present

## 2019-07-23 DIAGNOSIS — D539 Nutritional anemia, unspecified: Secondary | ICD-10-CM | POA: Diagnosis not present

## 2019-07-23 DIAGNOSIS — Z90710 Acquired absence of both cervix and uterus: Secondary | ICD-10-CM | POA: Diagnosis not present

## 2019-07-23 MED ORDER — DEXAMETHASONE 4 MG PO TABS: ORAL_TABLET | ORAL | 0 refills | Status: DC

## 2019-07-23 MED ORDER — PROCHLORPERAZINE MALEATE 10 MG PO TABS: 10.0000 mg | ORAL_TABLET | Freq: Four times a day (QID) | ORAL | 1 refills | Status: DC | PRN

## 2019-07-23 MED ORDER — LIDOCAINE-PRILOCAINE 2.5-2.5 % EX CREA: TOPICAL_CREAM | CUTANEOUS | 3 refills | Status: DC

## 2019-07-23 MED ORDER — ONDANSETRON HCL 8 MG PO TABS: 8.0000 mg | ORAL_TABLET | Freq: Three times a day (TID) | ORAL | 1 refills | Status: DC | PRN

## 2019-07-23 NOTE — Progress Notes (Signed)
START ON PATHWAY REGIMEN - Uterine     A cycle is every 21 days:     Paclitaxel      Carboplatin   **Always confirm dose/schedule in your pharmacy ordering system**  Patient Characteristics: Endometrioid, Newly Diagnosed, Postoperative (Pathologic Staging), Stage IIIC1/IIIC2 - Grade 1, 2, or 3 Histology: Endometrioid Therapeutic Status: Newly Diagnosed, Postoperative (Pathologic Staging) AJCC T Category: pT3 AJCC N Category: pN1 AJCC 8 Stage Grouping: IIIC AJCC M Category: cM0 Intent of Therapy: Curative Intent, Discussed with Patient

## 2019-07-23 NOTE — Patient Instructions (Signed)
You are healing great from surgery!  I will see you after you finish treatment with Dr. Alvy Bimler and Dr. Sondra Come.  If you have vaginal bleeding, pelvic pain, or any other symptoms, please call the clinic at [phone].

## 2019-07-23 NOTE — Progress Notes (Signed)
Gave Heidi Avila the [employer] and encouraged her to call with any questions or needs.

## 2019-07-24 ENCOUNTER — Encounter: Payer: Self-pay | Admitting: Hematology and Oncology

## 2019-07-24 DIAGNOSIS — R897 Abnormal histological findings in specimens from other organs, systems and tissues: Secondary | ICD-10-CM | POA: Insufficient documentation

## 2019-07-24 DIAGNOSIS — K5909 Other constipation: Secondary | ICD-10-CM | POA: Insufficient documentation

## 2019-07-24 DIAGNOSIS — D539 Nutritional anemia, unspecified: Secondary | ICD-10-CM | POA: Insufficient documentation

## 2019-07-24 NOTE — Progress Notes (Signed)
[facility] CONSULT NOTE  Patient Care Team: Patient, No Pcp Per as PCP - General (General Practice) Heidi Avila, Heidi Cotta, RN as Oncology Nurse Navigator (Oncology)  ASSESSMENT & PLAN:  Endometrial cancer [facility]) We reviewed the NCCN guidelines We discussed the role of chemotherapy. The intent is of curative intent.  We discussed some of the risks, benefits, side-effects of carboplatin & Taxol. Treatment is intravenous, every 3 weeks x 6 cycles  Some of the short term side-effects included, though not limited to, including weight loss, life threatening infections, risk of allergic reactions, need for transfusions of blood products, nausea, vomiting, change in bowel habits, loss of hair, admission to hospital for various reasons, and risks of death.   Long term side-effects are also discussed including risks of infertility, permanent damage to nerve function, hearing loss, chronic fatigue, kidney damage with possibility needing hemodialysis, and rare secondary malignancy including bone marrow disorders.  The patient is aware that the response rates discussed earlier is not guaranteed.  After a long discussion, patient made an informed decision to proceed with the prescribed plan of care.   Patient education material was dispensed. We discussed premedication with dexamethasone before chemotherapy. I recommend port placement and chemo education class We will get interventional radiology department to draw her baseline labs when she show up for port placement I do not plan prophylactic G-CSF support Due to class III obesity, I plan to set maximum dose of carboplatin at 750 mg I plan to reduce Taxol at 50% dose due to pre-existing peripheral neuropathy as well as due to class III obesity   Peripheral polyneuropathy She has pre-existing peripheral neuropathy of unknown etiology I plan to check serum vitamin B12 in her next blood draw She is already taking high doses of gabapentin at  baseline I plan to prescribe upfront dose reduction of Taxol by 50%  Acute on chronic renal failure (HCC) She had recent nice recovery of renal function We will recheck baseline creatinine again before treatment and we will adjust the dose of carboplatin accordingly  Other constipation She is at risk of constipation during treatment We discussed the importance of aggressive laxative therapy  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 IR IMAGING GUIDED PORT INSERTION    Standing Status:   Future    Standing Expiration Date:   09/21/2020    Order Specific Question:   Reason for Exam (SYMPTOM  OR DIAGNOSIS REQUIRED)    Answer:   need port for chemo to start 4/19    Order Specific Question:   Preferred Imaging Location?    Answer:   [facility]  . CBC with Differential ([place] Only)    Standing Status:   Standing    Number of Occurrences:   20    Standing Expiration Date:   07/22/2020  . CMP ([place] only)    Standing Status:   Standing    Number of Occurrences:   20    Standing Expiration Date:   07/22/2020  . Vitamin B12    Standing Status:   Future    Standing Expiration Date:   08/27/2020  The total time spent in the appointment was 60 minutes encounter with patients including review of chart and various tests results, discussions about plan of care and coordination of care plan   All questions were answered. The patient knows to call the clinic with any problems, questions or concerns. No barriers to learning was detected.  Heidi Lark, MD 4/9/20218:04 AM  CHIEF COMPLAINTS/PURPOSE OF CONSULTATION:  Uterine  cancer, for further management  HISTORY OF PRESENTING ILLNESS:  Heidi Avila 74 y.o. female is here because of diagnosis of uterine cancer The patient had been dealing with postmenopausal bleeding for a long time Subsequently, she underwent endometrial biopsy suspicious for malignancy I have reviewed her chart and materials related to her cancer extensively and collaborated history with the patient. Summary of oncologic history is as follows: Oncology History Overview Note  Endometrioid, MSI High, MLH1 promoter hypermethylation present   Endometrial cancer ([place])  05/18/2019 Initial Biopsy   EMB: Complex atypical hyperplasia, cannot rule out microscopic focus of FIGO grade 1 carcinoma   05/18/2019 Initial Diagnosis   Presented in February 2021 with postmenopausal bleeding.  She was started on Provera and underwent endometrial biopsy showing complex atypical hyperplasia with a possible focus suspicious for grade 1 endometrial cancer.   05/21/2019 Imaging   Pelvic ultrasound at [facility]: Anteverted uterus measuring 8.2 cm in greatest dimension.  Endometrial lining measures 1.73 cm and is thickened.  Left ovary not visualized, right ovary normal in appearance.   06/30/2019 Surgery   Robotic-assisted laparoscopic total hysterectomy with bilateral salpingoophorectomy, bilateral pelvic LND  Frozen section showed endometrial cancer with more than 50% myometrial invasion.  Patient did not map to a sentinel lymph node bilaterally.   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  Tumor measures 3.5 cm and invades focally into the serosa.  No involvement of the cervix or bilateral adnexa.  1 of 20 lymph nodes positive for metastatic carcinoma, focus measures 1 cm without evidence of extracapsular extension.  FINAL MICROSCOPIC DIAGNOSIS: A. UTERUS, CERVIX, BILATERAL FALLOPIAN TUBES AND OVARIES, HYSTERECTOMY WITH SALPINGECTOMY: - Endometrioid  adenocarcinoma, 3.5 cm, FIGO grade 1 - Carcinoma invades through the entire myometrium and focally into the serosal surface - Benign unremarkable cervix - Benign unremarkable bilateral fallopian tubes and left ovary - Benign serous inclusion cyst of right ovary - See oncology table B. LYMPH NODE, RIGHT PELVIC, BIOPSY: - Metastatic carcinoma to one of twelve lymph nodes (1/12) - Focus of metastatic carcinoma measures 1.0 cm, without evidence of extracapsular extension C. LYMPH NODE, LEFT PELVIC, BIOPSY: - Eight benign lymph nodes (0/8) ONCOLOGY TABLE: UTERUS, CARCINOMA OR CARCINOSARCOMA Procedure: Total hysterectomy and bilateral salpingo-oophorectomy Histologic type: Endometrioid adenocarcinoma Histologic Grade: FIGO grade 1 Myometrial invasion: Depth of invasion: 30 mm Myometrial thickness: 30 mm Uterine Serosa Involvement: Present, focal Cervical stromal involvement: Not identified Extent of involvement of other organs: Not identified Lymphovascular invasion: Not identified Regional Lymph Nodes: Examined: 0 Sentinel 20 Non-sentinel 20 Total Lymph nodes with metastasis: 1 Isolated tumor cells (<0.2 mm): 0 Micrometastasis: (>0.2 mm and < 2.0 mm): 0 Macrometastasis: (>2.0 mm): 1 Extracapsular extension: Not identified Representative Tumor Block: A6 MMR / MSI testing: Will be ordered Pathologic Stage Classification (pTNM, AJCC 8th edition): pT3, pN1a Comments: None Mismatch Repair Protein (IHC) SUMMARY INTERPRETATION: ABNORMAL 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 The loss of expression may be secondary to promoter hyper-methylation, gene mutation or other genetic event. BRAF mutation testing  and/or MLH1 methylation testing is indicated. The presence of a BRAF mutation and/or MLH1 hypermethylation is indicative of a sporadic-type tumor. The absence of either BRAF mutation and/or presence of normal methylation indicate the possible presence of a  hereditary germline mutation (e.g.Lynch syndrome) and referral to genetic counseling is warranted. It is recommended that the loss of protein expression be correlated with molecular based MSI testing   06/30/2019 Cancer Staging   Staging form: Corpus Uteri - Carcinoma and Carcinosarcoma, AJCC 8th Edition - Clinical stage from 06/30/2019: FIGO Stage IIIC1 (cT3, cN1a, cM0) - Signed by Lafonda Mosses, MD on 07/09/2019   07/16/2019 Imaging   1. No evidence of metastatic disease in the chest, abdomen or pelvis. 2. Status post hysterectomy. Small volume simple ascites. Small amount of ill-defined fluid in the pelvic sidewalls, compatible with recent surgery. 3. Small umbilical hernia containing fat and trace ascitic fluid. 4. Chronic findings include: Aortic Atherosclerosis (ICD10-I70.0). Small hiatal hernia. Mild sigmoid diverticulosis    She is doing well since surgery Denies significant pain The patient had background history of severe peripheral neuropathy after a bout of severe illness several years ago She takes high doses of gabapentin She described the tips of fingers and toes as tingly and numb She had mild intermittent constipation in the background  MEDICAL HISTORY:  Past Medical History:  Diagnosis Date  . Cataract   . Complex endometrial hyperplasia with atypia   . GERD (gastroesophageal reflux disease)   . Heart murmur    as a child  . Hypertension   . Obesity   . Pneumonia 2018  . Post-menopausal bleeding 05/18/2019    SURGICAL HISTORY: Past Surgical History:  Procedure Laterality Date  . BIOPSY ENDOMETRIAL    . CATARACT EXTRACTION  03/2019  . ESOPHAGOGASTRODUODENOSCOPY    . ROBOTIC ASSISTED TOTAL HYSTERECTOMY WITH BILATERAL SALPINGO OOPHERECTOMY Bilateral 06/30/2019   Procedure: XI ROBOTIC ASSISTED TOTAL HYSTERECTOMY WITH BILATERAL SALPINGO OOPHORECTOMY, BILATERAL LYMPH NODE DISSECTION;  Surgeon: Lafonda Mosses, MD;  Location: [facility];  Service: Gynecology;   Laterality: Bilateral;  . SENTINEL NODE BIOPSY N/A 06/30/2019   Procedure: SENTINEL LYMPH NODE BIOPSY;  Surgeon: Lafonda Mosses, MD;  Location: [facility];  Service: Gynecology;  Laterality: N/A;  . TONSILLECTOMY    . TOTAL HIP ARTHROPLASTY Left   . WISDOM TOOTH EXTRACTION      SOCIAL HISTORY: Social History   Socioeconomic History  . Marital status: Divorced    Spouse name: Not on file  . Number of children: Not on file  . Years of education: Not on file  . Highest education level: Not on file  Occupational History  . Not on file  Tobacco Use  . Smoking status: Never Smoker  . Smokeless tobacco: Never Used  Substance and Sexual Activity  . Alcohol use: No  . Drug use: Never  . Sexual activity: Not Currently  Other Topics Concern  . Not on file  Social History Narrative  . Not on file   Social Determinants of Health   Financial Resource Strain:   . Difficulty of Paying Living Expenses:   Food Insecurity:   . Worried About Charity fundraiser in the Last Year:   . Arboriculturist in the Last Year:   Transportation Needs:   . Film/video editor (Medical):   Marland Kitchen Lack of Transportation (Non-Medical):   Physical Activity:   . Days of Exercise per Week:   . Minutes of Exercise per Session:   Stress:   .  Feeling of Stress :   Social Connections:   . Frequency of Communication with Friends and Family:   . Frequency of Social Gatherings with Friends and Family:   . Attends Religious Services:   . Active Member of Clubs or Organizations:   . Attends Archivist Meetings:   Marland Kitchen Marital Status:   Intimate Partner Violence:   . Fear of Current or Ex-Partner:   . Emotionally Abused:   Marland Kitchen Physically Abused:   . Sexually Abused:     FAMILY HISTORY: Family History  Problem Relation Age of Onset  . Colon cancer Mother 65       treated with surgery only  . Lung cancer Mother   . CVA Father   . Endometrial cancer Neg Hx   . Ovarian cancer Neg Hx   . Breast  cancer Neg Hx     ALLERGIES:  is allergic to amoxicillin and latex.  MEDICATIONS:  Current Outpatient Medications  Medication Sig Dispense Refill  . AMITIZA 24 MCG capsule Take 48 mcg by mouth every evening.     . B Complex Vitamins (B COMPLEX PO) Take 1 Dose by mouth daily. Liquid b complex    . benazepril-hydrochlorthiazide (LOTENSIN HCT) 20-25 MG tablet Take 1 tablet by mouth every evening.     . brimonidine (ALPHAGAN) 0.2 % ophthalmic solution Place 1 drop into the right eye in the morning and at bedtime.    Marland Kitchen dexamethasone (DECADRON) 4 MG tablet Take 2 tabs at the night before and 2 tab the morning of chemotherapy, every 3 weeks, by mouth x 6 cycles total 24 tablet 0  . diphenhydrAMINE (BENADRYL) 25 MG tablet Take 50 mg by mouth daily as needed for allergies.    Marland Kitchen gabapentin (NEURONTIN) 600 MG tablet Take 600-1,200 mg by mouth See admin instructions. Take 1200 mg in the morning, 600 mg in the afternoon, and 600 mg at night    . ketorolac (ACULAR) 0.5 % ophthalmic solution Place 1 drop into the right eye 4 (four) times daily.     Marland Kitchen lidocaine-prilocaine (EMLA) cream Apply to affected area once 30 g 3  . Melatonin 5 MG CAPS Take 5 mg by mouth at bedtime.    . meloxicam (MOBIC) 15 MG tablet Take 15 mg by mouth daily in the afternoon.     . methocarbamol (ROBAXIN) 500 MG tablet Take 1,000 mg by mouth 2 (two) times daily as needed.    Marland Kitchen ofloxacin (OCUFLOX) 0.3 % ophthalmic solution Place 1 drop into the right eye 4 (four) times daily.     Marland Kitchen omeprazole (PRILOSEC OTC) 20 MG tablet Take 20 mg by mouth every evening.    . ondansetron (ZOFRAN) 8 MG tablet Take 1 tablet (8 mg total) by mouth every 8 (eight) hours as needed. Start on the third day after chemotherapy. 30 tablet 1  . oxyCODONE (OXY IR/ROXICODONE) 5 MG immediate release tablet Take 1 tablet (5 mg total) by mouth every 4 (four) hours as needed for severe pain. Do not take with tramadol and do not take and drive 10 tablet 0  .  Oxymetazoline HCl (VICKS SINEX 12 HOUR NA) Place 1 spray into the nose 2 (two) times daily as needed (congestion).    . prednisoLONE acetate (PRED FORTE) 1 % ophthalmic suspension Place 1 drop into the right eye every 2 (two) hours.    . prochlorperazine (COMPAZINE) 10 MG tablet Take 1 tablet (10 mg total) by mouth every 6 (six) hours as needed (  Nausea or vomiting). 30 tablet 1  . senna-docusate (SENOKOT-S) 8.6-50 MG tablet Take 2 tablets by mouth at bedtime. For AFTER surgery, do not take if having diarrhea 30 tablet 1  . tiZANidine (ZANAFLEX) 4 MG tablet Take 4 mg by mouth at bedtime.    . topiramate (TOPAMAX) 25 MG tablet Take 75 mg by mouth at bedtime.    . traMADol (ULTRAM) 50 MG tablet Take 1 tablet (50 mg total) by mouth every 6 (six) hours as needed for severe pain. For AFTER surgery, do not take and drive 10 tablet 0   No current facility-administered medications for this visit.    REVIEW OF SYSTEMS:   Constitutional: Denies fevers, chills or abnormal night sweats Eyes: Denies blurriness of vision, double vision or watery eyes Ears, nose, mouth, throat, and face: Denies mucositis or sore throat Respiratory: Denies cough, dyspnea or wheezes Cardiovascular: Denies palpitation, chest discomfort or lower extremity swelling Gastrointestinal:  Denies nausea, heartburn or change in bowel habits Skin: Denies abnormal skin rashes Lymphatics: Denies new lymphadenopathy or easy bruising Behavioral/Psych: Mood is stable, no new changes  All other systems were reviewed with the patient and are negative.  PHYSICAL EXAMINATION: ECOG PERFORMANCE STATUS: 1 - Symptomatic but completely ambulatory  Vitals:   07/23/19 1406  BP: (!) 154/64  Pulse: 81  Resp: 18  Temp: 99.1 F (37.3 C)  SpO2: 92%   Filed Weights   07/23/19 1406  Weight: 247 lb 12.8 oz (112.4 kg)    GENERAL:alert, no distress and comfortable.  Limited examination due to class III obesity SKIN: skin color, texture, turgor  are normal, no rashes or significant lesions EYES: normal, conjunctiva are pink and non-injected, sclera clear OROPHARYNX:no exudate, no erythema and lips, buccal mucosa, and tongue normal  NECK: supple, thyroid normal size, non-tender, without nodularity LYMPH:  no palpable lymphadenopathy in the cervical, axillary or inguinal LUNGS: clear to auscultation and percussion with normal breathing effort HEART: regular rate & rhythm and no murmurs and no lower extremity edema ABDOMEN:abdomen soft, non-tender and normal bowel sounds.  Well-healed surgical scar Musculoskeletal:no cyanosis of digits and no clubbing  PSYCH: alert & oriented x 3 with fluent speech NEURO: no focal motor/sensory deficits  LABORATORY DATA:  I have reviewed the data as listed Lab Results  Component Value Date   WBC 9.0 07/16/2019   HGB 13.8 07/16/2019   HCT 42.5 07/16/2019   MCV 90.0 07/16/2019   PLT 360 07/16/2019   Recent Labs    06/25/19 1452 07/01/19 0445 07/01/19 1738 07/02/19 0636 07/16/19 1240  NA 139   < > 135 135 140  K 3.6   < > 3.5 4.1 3.6  CL 105   < > 101 103 105  CO2 26   < > 24 25 23   GLUCOSE 129*   < > 160* 147* 147*  BUN 18   < > 24* 21 15  CREATININE 1.18*   < > 2.51* 1.81* 1.21*  CALCIUM 9.4   < > 8.9 8.8* 9.6  GFRNONAA 46*   < > 18* 27* 44*  GFRAA 53*   < > 21* 32* 51*  PROT 6.9  --   --   --  7.1  ALBUMIN 4.1  --   --   --  3.9  AST 17  --   --   --  13*  ALT 16  --   --   --  15  ALKPHOS 30*  --   --   --  44  BILITOT 0.7  --   --   --  0.5   < > = values in this interval not displayed.    RADIOGRAPHIC STUDIES: I have personally reviewed the radiological images as listed and agreed with the findings in the report. DG Chest 2 View  Result Date: 06/26/2019 CLINICAL DATA:  Preoperative EXAM: CHEST - 2 VIEW COMPARISON:  06/04/2012 FINDINGS: The heart size and mediastinal contours are within normal limits. Both lungs are clear. Disc degenerative disease of the thoracic spine.  IMPRESSION: No acute abnormality of the lungs. Electronically Signed   By: Eddie Candle M.D.   On: 06/26/2019 08:12   CT Chest W Contrast  Result Date: 07/16/2019 CLINICAL DATA:  Recent diagnosis of endometrial cancer, status post [facility] 06/30/2019. Staging evaluation. EXAM: CT CHEST, ABDOMEN, AND PELVIS WITH CONTRAST TECHNIQUE: Multidetector CT imaging of the chest, abdomen and pelvis was performed following the standard protocol during bolus administration of intravenous contrast. CONTRAST:  51m OMNIPAQUE IOHEXOL 300 MG/ML  SOLN COMPARISON:  06/25/2019 chest radiograph. FINDINGS: CT CHEST FINDINGS Cardiovascular: Normal heart size. No significant pericardial effusion/thickening. Great vessels are normal in course and caliber. No central pulmonary emboli. Mediastinum/Nodes: No discrete thyroid nodules. Unremarkable esophagus. No pathologically enlarged axillary, mediastinal or hilar lymph nodes. Lungs/Pleura: No pneumothorax. No pleural effusion. No acute consolidative airspace disease, lung masses or significant pulmonary nodules. Upper abdomen: No acute abnormality. Musculoskeletal: No aggressive appearing focal osseous lesions. Mild thoracic spondylosis. CT ABDOMEN PELVIS FINDINGS Hepatobiliary: Normal liver with no liver mass. Normal gallbladder with no radiopaque cholelithiasis. No biliary ductal dilatation. Pancreas: Normal, with no mass or duct dilation. Spleen: Normal size. No mass. Adrenals/Urinary Tract: Normal adrenals. Subcentimeter hypodense renal cortical lesion in the lower right kidney, too small to characterize, requiring no follow-up. Otherwise normal kidneys, with no hydronephrosis. Nondistended bladder obscured by streak artifact from left hip hardware. Bladder is grossly normal. Stomach/Bowel: Small hiatal hernia. Otherwise normal stomach. Normal caliber small bowel with no small bowel wall thickening. Candidate normal appendix. Mild sigmoid diverticulosis, with no large bowel wall  thickening or significant pericolonic fat stranding. Oral contrast transits to the right colon. Vascular/Lymphatic: Mildly atherosclerotic nonaneurysmal abdominal aorta. Patent portal, splenic, hepatic and renal veins. No pathologically enlarged lymph nodes in the abdomen or pelvis. Reproductive: Status post hysterectomy. Hysterectomy margin obscured by streak artifact, with no discrete mass or fluid collection. No adnexal masses. Other: No pneumoperitoneum. Small volume simple ascites, most prominent in the pelvis. Small amount of simple ill-defined fluid in the pelvic sidewalls. Small umbilical hernia containing fat and trace ascitic fluid. Musculoskeletal: No aggressive appearing focal osseous lesions. Left hip arthroplasty. Mild lumbar spondylosis. IMPRESSION: 1. No evidence of metastatic disease in the chest, abdomen or pelvis. 2. Status post hysterectomy. Small volume simple ascites. Small amount of ill-defined fluid in the pelvic sidewalls, compatible with recent surgery. 3. Small umbilical hernia containing fat and trace ascitic fluid. 4. Chronic findings include: Aortic Atherosclerosis (ICD10-I70.0). Small hiatal hernia. Mild sigmoid diverticulosis. Electronically Signed   By: JIlona SorrelM.D.   On: 07/16/2019 20:43   CT Abdomen Pelvis W Contrast  Result Date: 07/16/2019 CLINICAL DATA:  [facility] of endometrial cancer[facility] Staging evaluation. EXAM: CT CHEST, ABDOMEN, AND PELVIS WITH CONTRAST TECHNIQUE: Multidetector CT imaging of the chest, abdomen and pelvis was performed following the standard protocol during bolus administration of intravenous contrast. CONTRAST:  829mOMNIPAQUE IOHEXOL 300 MG/ML  SOLN COMPARISON:  06/25/2019 chest radiograph. FINDINGS: CT CHEST FINDINGS Cardiovascular:  Normal heart size. No significant pericardial effusion/thickening. Great vessels are normal in course and caliber. No central pulmonary emboli. Mediastinum/Nodes: No discrete thyroid  nodules. Unremarkable esophagus. No pathologically enlarged axillary, mediastinal or hilar lymph nodes. Lungs/Pleura: No pneumothorax. No pleural effusion. No acute consolidative airspace disease, lung masses or significant pulmonary nodules. Upper abdomen: No acute abnormality. Musculoskeletal: No aggressive appearing focal osseous lesions. Mild thoracic spondylosis. CT ABDOMEN PELVIS FINDINGS Hepatobiliary: Normal liver with no liver mass. Normal gallbladder with no radiopaque cholelithiasis. No biliary ductal dilatation. Pancreas: Normal, with no mass or duct dilation. Spleen: Normal size. No mass. Adrenals/Urinary Tract: Normal adrenals. Subcentimeter hypodense renal cortical lesion in the lower right kidney, too small to characterize, requiring no follow-up. Otherwise normal kidneys, with no hydronephrosis. Nondistended bladder obscured by streak artifact from left hip hardware. Bladder is grossly normal. Stomach/Bowel: Small hiatal hernia. Otherwise normal stomach. Normal caliber small bowel with no small bowel wall thickening. Candidate normal appendix. Mild sigmoid diverticulosis, with no large bowel wall thickening or significant pericolonic fat stranding. Oral contrast transits to the right colon. Vascular/Lymphatic: Mildly atherosclerotic nonaneurysmal abdominal aorta. Patent portal, splenic, hepatic and renal veins. No pathologically enlarged lymph nodes in the abdomen or pelvis. Reproductive: Status post hysterectomy. Hysterectomy margin obscured by streak artifact, with no discrete mass or fluid collection. No adnexal masses. Other: No pneumoperitoneum. Small volume simple ascites, most prominent in the pelvis. Small amount of simple ill-defined fluid in the pelvic sidewalls. Small umbilical hernia containing fat and trace ascitic fluid. Musculoskeletal: No aggressive appearing focal osseous lesions. Left hip arthroplasty. Mild lumbar spondylosis. IMPRESSION: 1. No evidence of metastatic disease in the  chest, abdomen or pelvis. 2. Status post hysterectomy. Small volume simple ascites. Small amount of ill-defined fluid in the pelvic sidewalls, compatible with recent surgery. 3. Small umbilical hernia containing fat and trace ascitic fluid. 4. Chronic findings include: Aortic Atherosclerosis (ICD10-I70.0). Small hiatal hernia. Mild sigmoid diverticulosis. Electronically Signed   By: Ilona Sorrel M.D.   On: 07/16/2019 20:43   OCT, Retina - OU - Both Eyes  Result Date: 07/20/2019 Right Eye Quality was good. Central Foveal Thickness: 266. Progression has no prior data. Findings include normal foveal contour, no IRF, no SRF, myopic contour, epiretinal membrane. Left Eye Quality was good. Central Foveal Thickness: 284. Progression has no prior data. 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 CME - cystoid macular edema. PED - pigment epithelial detachment. IRF - intraretinal fluid. SRF - subretinal fluid. EZ - ellipsoid zone. ERM - epiretinal membrane. ORA - outer retinal atrophy. ORT - outer retinal tubulation. SRHM - subretinal hyper-reflective material

## 2019-07-24 NOTE — Assessment & Plan Note (Signed)
She is at risk of constipation during treatment We discussed the importance of aggressive laxative therapy

## 2019-07-24 NOTE — Assessment & Plan Note (Signed)
She had recent nice recovery of renal function We will recheck baseline creatinine again before treatment and we will adjust the dose of carboplatin accordingly

## 2019-07-24 NOTE — Assessment & Plan Note (Signed)
We reviewed the NCCN guidelines We discussed the role of chemotherapy. The intent is of curative intent.  We discussed some of the risks, benefits, side-effects of carboplatin & Taxol. Treatment is intravenous, every 3 weeks x 6 cycles  Some of the short term side-effects included, though not limited to, including weight loss, life threatening infections, risk of allergic reactions, need for transfusions of blood products, nausea, vomiting, change in bowel habits, loss of hair, admission to hospital for various reasons, and risks of death.   Long term side-effects are also discussed including risks of infertility, permanent damage to nerve function, hearing loss, chronic fatigue, kidney damage with possibility needing hemodialysis, and rare secondary malignancy including bone marrow disorders.  The patient is aware that the response rates discussed earlier is not guaranteed.  After a long discussion, patient made an informed decision to proceed with the prescribed plan of care.   Patient education material was dispensed. We discussed premedication with dexamethasone before chemotherapy. I recommend port placement and chemo education class We will get interventional radiology department to draw her baseline labs when she show up for port placement I do not plan prophylactic G-CSF support Due to class III obesity, I plan to set maximum dose of carboplatin at 750 mg I plan to reduce Taxol at 50% dose due to pre-existing peripheral neuropathy as well as due to class III obesity

## 2019-07-24 NOTE — Assessment & Plan Note (Addendum)
She has pre-existing peripheral neuropathy of unknown etiology I plan to check serum vitamin B12 in her next blood draw She is already taking high doses of gabapentin at baseline I plan to prescribe upfront dose reduction of Taxol by 50%

## 2019-07-24 NOTE — Assessment & Plan Note (Signed)
We discussed the role of genetic counseling and genetic testing At the end of today's visit, she appears to be overwhelmed She requested the genetic counselor appointment to be removed I will talk to the patient again at the end of her treatment to see if she is interested

## 2019-07-28 ENCOUNTER — Other Ambulatory Visit: Payer: Self-pay | Admitting: Physician Assistant

## 2019-07-28 ENCOUNTER — Other Ambulatory Visit: Payer: Self-pay | Admitting: Radiology

## 2019-07-28 NOTE — Progress Notes (Signed)
Pharmacist Chemotherapy Monitoring - Initial Assessment    Anticipated start date: 08/03/19   Regimen:  . Are orders appropriate based on the patient's diagnosis, regimen, and cycle? Yes . Does the plan date match the patient's scheduled date? Yes . Is the sequencing of drugs appropriate? Yes . Are the premedications appropriate for the patient's regimen? Yes . Prior Authorization for treatment is: Pending o If applicable, is the correct biosimilar selected based on the patient's insurance? not applicable  Organ Function and Labs: Marland Kitchen Are dose adjustments needed based on the patient's renal function, hepatic function, or hematologic function? No . Are appropriate labs ordered prior to the start of patient's treatment? Yes . Other organ system assessment, if indicated: N/A . The following baseline labs, if indicated, have been ordered: N/A  Dose Assessment: . Are the drug doses appropriate? Yes . Are the following correct: o Drug concentrations Yes o IV fluid compatible with drug Yes o Administration routes Yes o Timing of therapy Yes . If applicable, does the patient have documented access for treatment and/or plans for port-a-cath placement? no . If applicable, have lifetime cumulative doses been properly documented and assessed? yes Lifetime Dose Tracking  No doses have been documented on this patient for the following tracked chemicals: Doxorubicin, Epirubicin, Idarubicin, Daunorubicin, Mitoxantrone, Bleomycin, Oxaliplatin, Carboplatin, Liposomal Doxorubicin  o   Toxicity Monitoring/Prevention: . The patient has the following take home antiemetics prescribed: Ondansetron, Prochlorperazine and Dexamethasone . The patient has the following take home medications prescribed: N/A . Medication allergies and previous infusion related reactions, if applicable, have been reviewed and addressed. Yes . The patient's current medication list has been assessed for drug-drug interactions with  their chemotherapy regimen. no significant drug-drug interactions were identified on review.  Order Review: . Are the treatment plan orders signed? Yes . Is the patient scheduled to see a provider prior to their treatment? No  I verify that I have reviewed each item in the above checklist and answered each question accordingly.  Heidi Avila [facility] 07/28/2019 11:02 AM

## 2019-07-29 ENCOUNTER — Ambulatory Visit (HOSPITAL_COMMUNITY)
Admission: RE | Admit: 2019-07-29 | Discharge: 2019-07-29 | Disposition: A | Discharge status: Home / Self Care | Payer: Medicare Other | Source: Ambulatory Visit | Attending: Hematology and Oncology | Admitting: Hematology and Oncology

## 2019-07-29 ENCOUNTER — Other Ambulatory Visit: Payer: Self-pay

## 2019-07-29 ENCOUNTER — Encounter (HOSPITAL_COMMUNITY): Payer: Self-pay

## 2019-07-29 DIAGNOSIS — Z452 Encounter for adjustment and management of vascular access device: Secondary | ICD-10-CM | POA: Diagnosis not present

## 2019-07-29 DIAGNOSIS — C541 Malignant neoplasm of endometrium: Secondary | ICD-10-CM | POA: Diagnosis not present

## 2019-07-29 DIAGNOSIS — K219 Gastro-esophageal reflux disease without esophagitis: Secondary | ICD-10-CM | POA: Insufficient documentation

## 2019-07-29 DIAGNOSIS — I10 Essential (primary) hypertension: Secondary | ICD-10-CM | POA: Insufficient documentation

## 2019-07-29 DIAGNOSIS — Z5111 Encounter for antineoplastic chemotherapy: Secondary | ICD-10-CM | POA: Diagnosis not present

## 2019-07-29 DIAGNOSIS — Z79899 Other long term (current) drug therapy: Secondary | ICD-10-CM | POA: Insufficient documentation

## 2019-07-29 DIAGNOSIS — E669 Obesity, unspecified: Secondary | ICD-10-CM | POA: Diagnosis not present

## 2019-07-29 HISTORY — PX: IR IMAGING GUIDED PORT INSERTION: IMG5740

## 2019-07-29 HISTORY — DX: Dyspnea, unspecified: R06.00

## 2019-07-29 LAB — CBC WITH DIFFERENTIAL/PLATELET
Abs Immature Granulocytes: 0.03 10*3/uL (ref 0.00–0.07)
Basophils Absolute: 0.1 10*3/uL (ref 0.0–0.1)
Basophils Relative: 1 %
Eosinophils Absolute: 0.4 10*3/uL (ref 0.0–0.5)
Eosinophils Relative: 6 %
HCT: 40.2 % (ref 36.0–46.0)
Hemoglobin: 13.2 g/dL (ref 12.0–15.0)
Immature Granulocytes: 0 %
Lymphocytes Relative: 18 %
Lymphs Abs: 1.3 10*3/uL (ref 0.7–4.0)
MCH: 29.9 pg (ref 26.0–34.0)
MCHC: 32.8 g/dL (ref 30.0–36.0)
MCV: 91 fL (ref 80.0–100.0)
Monocytes Absolute: 0.8 10*3/uL (ref 0.1–1.0)
Monocytes Relative: 12 %
Neutro Abs: 4.4 10*3/uL (ref 1.7–7.7)
Neutrophils Relative %: 63 %
Platelets: 305 10*3/uL (ref 150–400)
RBC: 4.42 MIL/uL (ref 3.87–5.11)
RDW: 13.3 % (ref 11.5–15.5)
WBC: 6.9 10*3/uL (ref 4.0–10.5)
nRBC: 0 % (ref 0.0–0.2)

## 2019-07-29 LAB — COMPREHENSIVE METABOLIC PANEL
ALT: 21 U/L (ref 0–44)
AST: 18 U/L (ref 15–41)
Albumin: 4 g/dL (ref 3.5–5.0)
Alkaline Phosphatase: 39 U/L (ref 38–126)
Anion gap: 10 (ref 5–15)
BUN: 16 mg/dL (ref 8–23)
CO2: 27 mmol/L (ref 22–32)
Calcium: 9.3 mg/dL (ref 8.9–10.3)
Chloride: 103 mmol/L (ref 98–111)
Creatinine, Ser: 1.22 mg/dL — ABNORMAL HIGH (ref 0.44–1.00)
GFR calc Af Amer: 51 mL/min — ABNORMAL LOW (ref >60)
GFR calc non Af Amer: 44 mL/min — ABNORMAL LOW (ref >60)
Glucose, Bld: 110 mg/dL — ABNORMAL HIGH (ref 70–99)
Potassium: 3.4 mmol/L — ABNORMAL LOW (ref 3.5–5.1)
Sodium: 140 mmol/L (ref 135–145)
Total Bilirubin: 0.7 mg/dL (ref 0.3–1.2)
Total Protein: 7 g/dL (ref 6.5–8.1)

## 2019-07-29 LAB — PROTIME-INR
INR: 0.9 (ref 0.8–1.2)
Prothrombin Time: 12.4 seconds (ref 11.4–15.2)

## 2019-07-29 MED ORDER — MIDAZOLAM HCL 2 MG/2ML IJ SOLN
INTRAMUSCULAR | Status: AC | PRN
  Administered 2019-07-29: 1 mg via INTRAVENOUS
  Administered 2019-07-29 (×2): 0.5 mg via INTRAVENOUS

## 2019-07-29 MED ORDER — FENTANYL CITRATE (PF) 100 MCG/2ML IJ SOLN
INTRAMUSCULAR | Status: AC
  Filled 2019-07-29: qty 2

## 2019-07-29 MED ORDER — CLINDAMYCIN PHOSPHATE 900 MG/50ML IV SOLN: 900.0000 mg | Freq: Once | INTRAVENOUS | Status: AC

## 2019-07-29 MED ORDER — HEPARIN SOD (PORK) LOCK FLUSH 100 UNIT/ML IV SOLN
INTRAVENOUS | Status: AC | PRN
  Administered 2019-07-29: 500 [IU] via INTRAVENOUS

## 2019-07-29 MED ORDER — LIDOCAINE-EPINEPHRINE 1 %-1:100000 IJ SOLN
INTRAMUSCULAR | Status: AC
  Filled 2019-07-29: qty 1

## 2019-07-29 MED ORDER — CLINDAMYCIN PHOSPHATE 900 MG/50ML IV SOLN
INTRAVENOUS | Status: AC
  Administered 2019-07-29: 14:00:00 900 mg via INTRAVENOUS
  Filled 2019-07-29: qty 50

## 2019-07-29 MED ORDER — SODIUM CHLORIDE 0.9 % IV SOLN: INTRAVENOUS | Status: DC

## 2019-07-29 MED ORDER — MIDAZOLAM HCL 2 MG/2ML IJ SOLN
INTRAMUSCULAR | Status: AC
  Filled 2019-07-29: qty 2

## 2019-07-29 MED ORDER — CIPROFLOXACIN IN D5W 400 MG/200ML IV SOLN
INTRAVENOUS | Status: AC
  Filled 2019-07-29: qty 200

## 2019-07-29 MED ORDER — HEPARIN SOD (PORK) LOCK FLUSH 100 UNIT/ML IV SOLN
INTRAVENOUS | Status: AC
  Filled 2019-07-29: qty 5

## 2019-07-29 MED ORDER — FENTANYL CITRATE (PF) 100 MCG/2ML IJ SOLN
INTRAMUSCULAR | Status: AC | PRN
  Administered 2019-07-29 (×2): 25 ug via INTRAVENOUS
  Administered 2019-07-29: 50 ug via INTRAVENOUS

## 2019-07-29 MED ORDER — LIDOCAINE-EPINEPHRINE 1 %-1:100000 IJ SOLN
INTRAMUSCULAR | Status: AC | PRN
  Administered 2019-07-29 (×2): 10 mL via INTRADERMAL

## 2019-07-29 NOTE — Discharge Instructions (Signed)
Urgent needs - IR on call MD [phone]  Wound - May remove dressing and shower in 24 to 48 hours.  Keep site clean and dry.  Replace with bandaid. Do not submerge in tub or water until site healing well.  If ordered by your provider, may start Emla cream in 2 weeks or after incision is healed.  After completion of treatment, your provider should have you set up for monthly port flushes.                                                            Moderate Conscious Sedation, Adult, Care After These instructions provide you with information about caring for yourself after your procedure. Your health care provider may also give you more specific instructions. Your treatment has been planned according to current medical practices, but problems sometimes occur. Call your health care provider if you have any problems or questions after your procedure. What can I expect after the procedure? After your procedure, it is common:  To feel sleepy for several hours.  To feel clumsy and have poor balance for several hours.  To have poor judgment for several hours.  To vomit if you eat too soon. Follow these instructions at home: For at least 24 hours after the procedure:  Do not: ? Participate in activities where you could fall or become injured. ? Drive. ? Use heavy machinery. ? Drink alcohol. ? Take sleeping pills or medicines that cause drowsiness. ? Make important decisions or sign legal documents. ? Take care of children on your own.  Rest. Eating and drinking  Follow the diet recommended by your health care provider.  If you vomit: ? Drink water, juice, or soup when you can drink without vomiting. ? Make sure you have little or no nausea before eating solid foods. General instructions  Have a responsible adult stay with you until you are awake and alert.  Take over-the-counter and prescription medicines only as told by your health care provider.  If you smoke, do not smoke  without supervision.  Keep all follow-up visits as told by your health care provider. This is important. Contact a health care provider if:  You keep feeling nauseous or you keep vomiting.  You feel light-headed.  You develop a rash.  You have a fever. Get help right away if:  You have trouble breathing. This information is not intended to replace advice given to you by your health care provider. Make sure you discuss any questions you have with your health care provider. Document Revised: 03/15/2017 Document Reviewed: 07/23/2015 Elsevier Patient Education  2020 [employer]   Implanted Port Insertion, Care After This sheet gives you information about how to care for yourself after your procedure. Your health care provider may also give you more specific instructions. If you have problems or questions, contact your health care provider. What can I expect after the procedure? After the procedure, it is common to have:  Discomfort at the port insertion site.  Bruising on the skin over the port. This should improve over 3-4 days. Follow these instructions at home: Port care  After your port is placed, you will get a manufacturer's information card. The card has information about your port. Keep this card with you at all times.  Take care of   the port as told by your health care provider. Ask your health care provider if you or a family member can get training for taking care of the port at home. A home health care nurse may also take care of the port.  Make sure to remember what type of port you have. Incision care  Follow instructions from your health care provider about how to take care of your port insertion site. Make sure you: ? Wash your hands with soap and water before and after you change your bandage (dressing). If soap and water are not available, use hand sanitizer. ? Change your dressing as told by your health care provider. ? Leave stitches (sutures), skin glue, or  adhesive strips in place. These skin closures may need to stay in place for 2 weeks or longer. If adhesive strip edges start to loosen and curl up, you may trim the loose edges. Do not remove adhesive strips completely unless your health care provider tells you to do that.  Check your port insertion site every day for signs of infection. Check for: ? Redness, swelling, or pain. ? Fluid or blood. ? Warmth. ? Pus or a bad smell. Activity  Return to your normal activities as told by your health care provider. Ask your health care provider what activities are safe for you.  Do not lift anything that is heavier than 10 lb (4.5 kg), or the limit that you are told, until your health care provider says that it is safe. General instructions  Take over-the-counter and prescription medicines only as told by your health care provider.  Do not take baths, swim, or use a hot tub until your health care provider approves. Ask your health care provider if you may take showers. You may only be allowed to take sponge baths.  Do not drive for 24 hours if you were given a sedative during your procedure.  Wear a medical alert bracelet in case of an emergency. This will tell any health care providers that you have a port.  Keep all follow-up visits as told by your health care provider. This is important. Contact a health care provider if:  You cannot flush your port with saline as directed, or you cannot draw blood from the port.  You have a fever or chills.  You have redness, swelling, or pain around your port insertion site.  You have fluid or blood coming from your port insertion site.  Your port insertion site feels warm to the touch.  You have pus or a bad smell coming from the port insertion site. Get help right away if:  You have chest pain or shortness of breath.  You have bleeding from your port that you cannot control. Summary  Take care of the port as told by your health care provider.  Keep the manufacturer's information card with you at all times.  Change your dressing as told by your health care provider.  Contact a health care provider if you have a fever or chills or if you have redness, swelling, or pain around your port insertion site.  Keep all follow-up visits as told by your health care provider. This information is not intended to replace advice given to you by your health care provider. Make sure you discuss any questions you have with your health care provider. Document Revised: 10/29/2017 Document Reviewed: 10/29/2017 Elsevier Patient Education  2020 [employer]   

## 2019-07-29 NOTE — Procedures (Signed)
Interventional Radiology Procedure Note  Procedure: RT IJ POWER PORT  Complications: None  Estimated Blood Loss: MIN  Findings: TIP SVCRA       

## 2019-07-29 NOTE — Consult Note (Signed)
Chief Complaint: Patient was seen in consultation today for Port-A-Cath placement  Referring Physician(s): [place]  Supervising Physician: Daryll Brod  Patient Status: [facility] - Out-pt  History of Present Illness: Heidi Avila is a 74 y.o. female with history of newly diagnosed endometrial carcinoma, status post TAH/BSO/bilateral pelvic lymph node dissection on 06/30/2019.  She presents today for Port-A-Cath placement for chemotherapy.  Additional history as below.  Past Medical History:  Diagnosis Date  . Cataract   . Complex endometrial hyperplasia with atypia   . GERD (gastroesophageal reflux disease)   . Heart murmur    as a child  . Hypertension   . Obesity   . Pneumonia 2018  . Post-menopausal bleeding 05/18/2019    Past Surgical History:  Procedure Laterality Date  . BIOPSY ENDOMETRIAL    . CATARACT EXTRACTION  03/2019  . ESOPHAGOGASTRODUODENOSCOPY    . ROBOTIC ASSISTED TOTAL HYSTERECTOMY WITH BILATERAL SALPINGO OOPHERECTOMY Bilateral 06/30/2019   Procedure: XI ROBOTIC ASSISTED TOTAL HYSTERECTOMY WITH BILATERAL SALPINGO OOPHORECTOMY, BILATERAL LYMPH NODE DISSECTION;  Surgeon: Lafonda Mosses, MD;  Location: [facility] ORS;  Service: Gynecology;  Laterality: Bilateral;  . SENTINEL NODE BIOPSY N/A 06/30/2019   Procedure: SENTINEL LYMPH NODE BIOPSY;  Surgeon: Lafonda Mosses, MD;  Location: [facility];  Service: Gynecology;  Laterality: N/A;  . TONSILLECTOMY    . TOTAL HIP ARTHROPLASTY Left   . WISDOM TOOTH EXTRACTION      Allergies: Amoxicillin and Latex  Medications: Prior to Admission medications   Medication Sig Start Date End Date Taking? Authorizing Provider  AMITIZA 24 MCG capsule Take 48 mcg by mouth every evening.  03/22/19   [provider]  B Complex Vitamins (B COMPLEX PO) Take 1 Dose by mouth daily. Liquid b complex    [provider]  benazepril-hydrochlorthiazide (LOTENSIN HCT) 20-25 MG tablet Take 1 tablet by mouth every  evening.  10/25/18   [provider]  brimonidine (ALPHAGAN) 0.2 % ophthalmic solution Place 1 drop into the right eye in the morning and at bedtime.    [provider]  dexamethasone (DECADRON) 4 MG tablet Take 2 tabs at the night before and 2 tab the morning of chemotherapy, every 3 weeks, by mouth x 6 cycles total 07/23/19   Heath Lark, MD  diphenhydrAMINE (BENADRYL) 25 MG tablet Take 50 mg by mouth daily as needed for allergies.    [provider]  gabapentin (NEURONTIN) 600 MG tablet Take 600-1,200 mg by mouth See admin instructions. Take 1200 mg in the morning, 600 mg in the afternoon, and 600 mg at night 05/11/15   [provider]  ketorolac (ACULAR) 0.5 % ophthalmic solution Place 1 drop into the right eye 4 (four) times daily.  05/04/19   [provider]  lidocaine-prilocaine (EMLA) cream Apply to affected area once 07/23/19   Heath Lark, MD  Melatonin 5 MG CAPS Take 5 mg by mouth at bedtime.    [provider]  meloxicam (MOBIC) 15 MG tablet Take 15 mg by mouth daily in the afternoon.  05/06/19   [provider]  methocarbamol (ROBAXIN) 500 MG tablet Take 1,000 mg by mouth 2 (two) times daily as needed. 06/11/19   [provider]  ofloxacin (OCUFLOX) 0.3 % ophthalmic solution Place 1 drop into the right eye 4 (four) times daily.  05/04/19   [provider]  omeprazole (PRILOSEC OTC) 20 MG tablet Take 20 mg by mouth every evening.    [provider]  ondansetron [facility])  8 MG tablet Take 1 tablet (8 mg total) by mouth every 8 (eight) hours as needed. Start on the third day after chemotherapy. 07/23/19   Heath Lark, MD  oxyCODONE (OXY IR/ROXICODONE) 5 MG immediate release tablet Take 1 tablet (5 mg total) by mouth every 4 (four) hours as needed for severe pain. Do not take with tramadol and do not take and drive 9/47/65   Cross, Lenna Sciara D, NP  Oxymetazoline HCl (VICKS SINEX 12 HOUR NA) Place 1 spray into the nose 2  (two) times daily as needed (congestion).    [provider]  prednisoLONE acetate (PRED FORTE) 1 % ophthalmic suspension Place 1 drop into the right eye every 2 (two) hours. 07/09/19   [provider]  prochlorperazine (COMPAZINE) 10 MG tablet Take 1 tablet (10 mg total) by mouth every 6 (six) hours as needed (Nausea or vomiting). 07/23/19   Heath Lark, MD  senna-docusate (SENOKOT-S) 8.6-50 MG tablet Take 2 tablets by mouth at bedtime. For AFTER surgery, do not take if having diarrhea 06/01/19   Cross, Lenna Sciara D, NP  tiZANidine (ZANAFLEX) 4 MG tablet Take 4 mg by mouth at bedtime. 05/06/19   [provider]  topiramate (TOPAMAX) 25 MG tablet Take 75 mg by mouth at bedtime. 05/06/19   [provider]  traMADol (ULTRAM) 50 MG tablet Take 1 tablet (50 mg total) by mouth every 6 (six) hours as needed for severe pain. For AFTER surgery, do not take and drive 4/65/03   Joylene John D, NP     Family History  Problem Relation Age of Onset  . Colon cancer Mother 25       treated with surgery only  . Lung cancer Mother   . CVA Father   . Endometrial cancer Neg Hx   . Ovarian cancer Neg Hx   . Breast cancer Neg Hx     Social History   Socioeconomic History  . Marital status: Divorced    Spouse name: Not on file  . Number of children: Not on file  . Years of education: Not on file  . Highest education level: Not on file  Occupational History  . Not on file  Tobacco Use  . Smoking status: Never Smoker  . Smokeless tobacco: Never Used  Substance and Sexual Activity  . Alcohol use: No  . Drug use: Never  . Sexual activity: Not Currently  Other Topics Concern  . Not on file  Social History Narrative  . Not on file   Social Determinants of Health   Financial Resource Strain:   . Difficulty of Paying Living Expenses:   Food Insecurity:   . Worried About Charity fundraiser in the Last Year:   . Arboriculturist in the Last Year:   Transportation Needs:    . Film/video editor (Medical):   Marland Kitchen Lack of Transportation (Non-Medical):   Physical Activity:   . Days of Exercise per Week:   . Minutes of Exercise per Session:   Stress:   . Feeling of Stress :   Social Connections:   . Frequency of Communication with Friends and Family:   . Frequency of Social Gatherings with Friends and Family:   . Attends Religious Services:   . Active Member of Clubs or Organizations:   . Attends Archivist Meetings:   Marland Kitchen Marital Status:      Review of Systems currently denies fever, headache, chest pain, dyspnea, cough, abdominal/back pain, nausea, vomiting or  bleeding  Vital Signs: BP (!) 152/87 (BP Location: Left Arm)   Pulse 70   Temp 98.6 F (37 C) (Oral)   Resp 18   SpO2 95%   Physical Exam awake, alert.  Chest clear to auscultation bilaterally.  Heart with regular rate and rhythm.  Abdomen obese, soft, positive bowel sounds, nontender, small umbilical hernia.  Bilateral pretibial edema noted.  Imaging: CT Chest W Contrast  Result Date: 07/16/2019 CLINICAL DATA:  Recent diagnosis of endometrial cancer, status post [facility] 06/30/2019. Staging evaluation. EXAM: CT CHEST, ABDOMEN, AND PELVIS WITH CONTRAST TECHNIQUE: Multidetector CT imaging of the chest, abdomen and pelvis was performed following the standard protocol during bolus administration of intravenous contrast. CONTRAST:  68mL OMNIPAQUE IOHEXOL 300 MG/ML  SOLN COMPARISON:  06/25/2019 chest radiograph. FINDINGS: CT CHEST FINDINGS Cardiovascular: Normal heart size. No significant pericardial effusion/thickening. Great vessels are normal in course and caliber. No central pulmonary emboli. Mediastinum/Nodes: No discrete thyroid nodules. Unremarkable esophagus. No pathologically enlarged axillary, mediastinal or hilar lymph nodes. Lungs/Pleura: No pneumothorax. No pleural effusion. No acute consolidative airspace disease, lung masses or significant pulmonary nodules. Upper abdomen: No acute  abnormality. Musculoskeletal: No aggressive appearing focal osseous lesions. Mild thoracic spondylosis. CT ABDOMEN PELVIS FINDINGS Hepatobiliary: Normal liver with no liver mass. Normal gallbladder with no radiopaque cholelithiasis. No biliary ductal dilatation. Pancreas: Normal, with no mass or duct dilation. Spleen: Normal size. No mass. Adrenals/Urinary Tract: Normal adrenals. Subcentimeter hypodense renal cortical lesion in the lower right kidney, too small to characterize, requiring no follow-up. Otherwise normal kidneys, with no hydronephrosis. Nondistended bladder obscured by streak artifact from left hip hardware. Bladder is grossly normal. Stomach/Bowel: Small hiatal hernia. Otherwise normal stomach. Normal caliber small bowel with no small bowel wall thickening. Candidate normal appendix. Mild sigmoid diverticulosis, with no large bowel wall thickening or significant pericolonic fat stranding. Oral contrast transits to the right colon. Vascular/Lymphatic: Mildly atherosclerotic nonaneurysmal abdominal aorta. Patent portal, splenic, hepatic and renal veins. No pathologically enlarged lymph nodes in the abdomen or pelvis. Reproductive: Status post hysterectomy. Hysterectomy margin obscured by streak artifact, with no discrete mass or fluid collection. No adnexal masses. Other: No pneumoperitoneum. Small volume simple ascites, most prominent in the pelvis. Small amount of simple ill-defined fluid in the pelvic sidewalls. Small umbilical hernia containing fat and trace ascitic fluid. Musculoskeletal: No aggressive appearing focal osseous lesions. Left hip arthroplasty. Mild lumbar spondylosis. IMPRESSION: 1. No evidence of metastatic disease in the chest, abdomen or pelvis. 2. Status post hysterectomy. Small volume simple ascites. Small amount of ill-defined fluid in the pelvic sidewalls, compatible with recent surgery. 3. Small umbilical hernia containing fat and trace ascitic fluid. 4. Chronic findings  include: Aortic Atherosclerosis (ICD10-I70.0). Small hiatal hernia. Mild sigmoid diverticulosis. Electronically Signed   By: Ilona Sorrel M.D.   On: 07/16/2019 20:43   CT Abdomen Pelvis W Contrast  Result Date: 07/16/2019 CLINICAL DATA:  Recent diagnosis of endometrial cancer, status post [facility] 06/30/2019. Staging evaluation. EXAM: CT CHEST, ABDOMEN, AND PELVIS WITH CONTRAST TECHNIQUE: Multidetector CT imaging of the chest, abdomen and pelvis was performed following the standard protocol during bolus administration of intravenous contrast. CONTRAST:  30mL OMNIPAQUE IOHEXOL 300 MG/ML  SOLN COMPARISON:  06/25/2019 chest radiograph. FINDINGS: CT CHEST FINDINGS Cardiovascular: Normal heart size. No significant pericardial effusion/thickening. Great vessels are normal in course and caliber. No central pulmonary emboli. Mediastinum/Nodes: No discrete thyroid nodules. Unremarkable esophagus. No pathologically enlarged axillary, mediastinal or hilar lymph nodes. Lungs/Pleura: No pneumothorax. No pleural effusion. No acute consolidative  airspace disease, lung masses or significant pulmonary nodules. Upper abdomen: No acute abnormality. Musculoskeletal: No aggressive appearing focal osseous lesions. Mild thoracic spondylosis. CT ABDOMEN PELVIS FINDINGS Hepatobiliary: Normal liver with no liver mass. Normal gallbladder with no radiopaque cholelithiasis. No biliary ductal dilatation. Pancreas: Normal, with no mass or duct dilation. Spleen: Normal size. No mass. Adrenals/Urinary Tract: Normal adrenals. Subcentimeter hypodense renal cortical lesion in the lower right kidney, too small to characterize, requiring no follow-up. Otherwise normal kidneys, with no hydronephrosis. Nondistended bladder obscured by streak artifact from left hip hardware. Bladder is grossly normal. Stomach/Bowel: Small hiatal hernia. Otherwise normal stomach. Normal caliber small bowel with no small bowel wall thickening. Candidate normal appendix.  Mild sigmoid diverticulosis, with no large bowel wall thickening or significant pericolonic fat stranding. Oral contrast transits to the right colon. Vascular/Lymphatic: Mildly atherosclerotic nonaneurysmal abdominal aorta. Patent portal, splenic, hepatic and renal veins. No pathologically enlarged lymph nodes in the abdomen or pelvis. Reproductive: Status post hysterectomy. Hysterectomy margin obscured by streak artifact, with no discrete mass or fluid collection. No adnexal masses. Other: No pneumoperitoneum. Small volume simple ascites, most prominent in the pelvis. Small amount of simple ill-defined fluid in the pelvic sidewalls. Small umbilical hernia containing fat and trace ascitic fluid. Musculoskeletal: No aggressive appearing focal osseous lesions. Left hip arthroplasty. Mild lumbar spondylosis. IMPRESSION: 1. No evidence of metastatic disease in the chest, abdomen or pelvis. 2. Status post hysterectomy. Small volume simple ascites. Small amount of ill-defined fluid in the pelvic sidewalls, compatible with recent surgery. 3. Small umbilical hernia containing fat and trace ascitic fluid. 4. Chronic findings include: Aortic Atherosclerosis (ICD10-I70.0). Small hiatal hernia. Mild sigmoid diverticulosis. Electronically Signed   By: Ilona Sorrel M.D.   On: 07/16/2019 20:43   OCT, Retina - OU - Both Eyes  Result Date: 07/20/2019 Right Eye Quality was good. Central Foveal Thickness: 266. Progression has no prior data. Findings include normal foveal contour, no IRF, no SRF, myopic contour, epiretinal membrane. Left Eye Quality was good. Central Foveal Thickness: 284. Progression has no prior data. 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 CME - cystoid macular edema. PED - pigment epithelial detachment. IRF - intraretinal fluid. SRF - subretinal fluid. EZ - ellipsoid zone. ERM - epiretinal membrane. ORA - outer retinal atrophy. ORT - outer retinal tubulation. SRHM - subretinal hyper-reflective material    Labs:  CBC: Recent Labs    07/01/19 0445 07/01/19 1738 07/02/19 0636 07/16/19 1240  WBC 13.1* 10.0 8.9 9.0  HGB 11.9* 11.6* 11.7* 13.8  HCT 37.8 37.2 36.7 42.5  PLT 308 289 270 360    COAGS: No results for input(s): INR, APTT in the last 8760 hours.  BMP: Recent Labs    07/01/19 1041 07/01/19 1738 07/02/19 0636 07/16/19 1240  NA 134* 135 135 140  K 3.6 3.5 4.1 3.6  CL 101 101 103 105  CO2 23 24 25 23   GLUCOSE 184* 160* 147* 147*  BUN 24* 24* 21 15  CALCIUM 8.6* 8.9 8.8* 9.6  CREATININE 2.33* 2.51* 1.81* 1.21*  GFRNONAA 20* 18* 27* 44*  GFRAA 23* 21* 32* 51*    LIVER FUNCTION TESTS: Recent Labs    06/25/19 1452 07/16/19  1240  BILITOT 0.7 0.5  AST 17 13*  ALT 16 15  ALKPHOS 30* 44  PROT 6.9 7.1  ALBUMIN 4.1 3.9    TUMOR MARKERS: No results for input(s): AFPTM, CEA, CA199, CHROMGRNA in the last 8760 hours.  Assessment and Plan: 74 y.o. female with history of newly diagnosed endometrial carcinoma, status post TAH/BSO/bilateral pelvic lymph node dissection on 06/30/2019.  She presents today for Port-A-Cath placement for chemotherapy.Risks and benefits of image guided port-a-catheter placement was discussed with the patient including, but not limited to bleeding, infection, pneumothorax, or fibrin sheath development and need for additional procedures.  All of the patient's questions were answered, patient is agreeable to proceed. Consent signed and in chart.     Thank you for this interesting consult.  I greatly enjoyed meeting [facility] and look forward to participating in their care.  A copy of this report was  sent to the requesting provider on this date.  Electronically Signed: D. Rowe Robert, PA-C 07/29/2019, 1:56 PM   I spent a total of   25 minutes  in face to face in clinical consultation, greater than 50% of which was counseling/coordinating care for Port-A-Cath placement

## 2019-07-30 ENCOUNTER — Telehealth: Payer: Self-pay

## 2019-07-30 ENCOUNTER — Other Ambulatory Visit: Payer: Self-pay | Admitting: Hematology and Oncology

## 2019-07-30 NOTE — Telephone Encounter (Signed)
She called to clarify appt times next week and how to take Decadron Rx. Reviewed both. She verbalized understanding.

## 2019-07-30 NOTE — Telephone Encounter (Signed)
She called and left a message asking for a call back.  Called and left a message asking her to call the office back.

## 2019-08-03 ENCOUNTER — Inpatient Hospital Stay: Payer: Medicare Other

## 2019-08-03 ENCOUNTER — Encounter: Payer: Medicare Other | Admitting: Licensed Clinical Social Worker

## 2019-08-03 ENCOUNTER — Other Ambulatory Visit: Payer: Self-pay

## 2019-08-03 ENCOUNTER — Encounter: Payer: Self-pay | Admitting: Hematology and Oncology

## 2019-08-03 VITALS — BP 136/64 | HR 65 | Temp 98.4°F | Resp 16 | Wt 243.5 lb

## 2019-08-03 DIAGNOSIS — Z90722 Acquired absence of ovaries, bilateral: Secondary | ICD-10-CM | POA: Diagnosis not present

## 2019-08-03 DIAGNOSIS — C541 Malignant neoplasm of endometrium: Secondary | ICD-10-CM

## 2019-08-03 DIAGNOSIS — Z90710 Acquired absence of both cervix and uterus: Secondary | ICD-10-CM | POA: Diagnosis not present

## 2019-08-03 DIAGNOSIS — C775 Secondary and unspecified malignant neoplasm of intrapelvic lymph nodes: Secondary | ICD-10-CM | POA: Diagnosis not present

## 2019-08-03 DIAGNOSIS — K5909 Other constipation: Secondary | ICD-10-CM | POA: Diagnosis not present

## 2019-08-03 DIAGNOSIS — Z5111 Encounter for antineoplastic chemotherapy: Secondary | ICD-10-CM | POA: Diagnosis not present

## 2019-08-03 MED ORDER — SODIUM CHLORIDE 0.9 % IV SOLN
587.4000 mg | Freq: Once | INTRAVENOUS | Status: AC
  Administered 2019-08-03: 590 mg via INTRAVENOUS
  Filled 2019-08-03: qty 59

## 2019-08-03 MED ORDER — DIPHENHYDRAMINE HCL 50 MG/ML IJ SOLN
50.0000 mg | Freq: Once | INTRAMUSCULAR | Status: AC
  Administered 2019-08-03: 50 mg via INTRAVENOUS

## 2019-08-03 MED ORDER — PALONOSETRON HCL INJECTION 0.25 MG/5ML
INTRAVENOUS | Status: AC
  Filled 2019-08-03: qty 5

## 2019-08-03 MED ORDER — SODIUM CHLORIDE 0.9 % IV SOLN
87.5000 mg/m2 | Freq: Once | INTRAVENOUS | Status: AC
  Administered 2019-08-03: 198 mg via INTRAVENOUS
  Filled 2019-08-03: qty 33

## 2019-08-03 MED ORDER — PALONOSETRON HCL INJECTION 0.25 MG/5ML
0.2500 mg | Freq: Once | INTRAVENOUS | Status: AC
  Administered 2019-08-03: 0.25 mg via INTRAVENOUS

## 2019-08-03 MED ORDER — SODIUM CHLORIDE 0.9 % IV SOLN
10.0000 mg | Freq: Once | INTRAVENOUS | Status: AC
  Administered 2019-08-03: 10 mg via INTRAVENOUS
  Filled 2019-08-03: qty 10

## 2019-08-03 MED ORDER — HEPARIN SOD (PORK) LOCK FLUSH 100 UNIT/ML IV SOLN
500.0000 [IU] | Freq: Once | INTRAVENOUS | Status: AC | PRN
  Administered 2019-08-03: 500 [IU]
  Filled 2019-08-03: qty 5

## 2019-08-03 MED ORDER — FAMOTIDINE IN NACL 20-0.9 MG/50ML-% IV SOLN
20.0000 mg | Freq: Once | INTRAVENOUS | Status: AC
  Administered 2019-08-03: 20 mg via INTRAVENOUS

## 2019-08-03 MED ORDER — DIPHENHYDRAMINE HCL 50 MG/ML IJ SOLN
INTRAMUSCULAR | Status: AC
  Filled 2019-08-03: qty 1

## 2019-08-03 MED ORDER — FAMOTIDINE IN NACL 20-0.9 MG/50ML-% IV SOLN
INTRAVENOUS | Status: AC
  Filled 2019-08-03: qty 50

## 2019-08-03 MED ORDER — SODIUM CHLORIDE 0.9 % IV SOLN
150.0000 mg | Freq: Once | INTRAVENOUS | Status: AC
  Administered 2019-08-03: 150 mg via INTRAVENOUS
  Filled 2019-08-03: qty 150

## 2019-08-03 MED ORDER — SODIUM CHLORIDE 0.9 % IV SOLN
Freq: Once | INTRAVENOUS | Status: AC
  Filled 2019-08-03: qty 250

## 2019-08-03 MED ORDER — SODIUM CHLORIDE 0.9% FLUSH
10.0000 mL | INTRAVENOUS | Status: DC | PRN
  Administered 2019-08-03: 10 mL
  Filled 2019-08-03: qty 10

## 2019-08-03 NOTE — Progress Notes (Signed)
Met with patient at registration to introduce myself as Arboriculturist and to offer available resources.  Discussed one-time $1000 Radio broadcast assistant to assist with personal expenses while going through treatment.  Gave her my card if interested in applying and advised what is needed and also for any additional financial questions or concerns.

## 2019-08-03 NOTE — Patient Instructions (Signed)
Paclitaxel injection What is this medicine? PACLITAXEL (PAK li TAX el) is a chemotherapy drug. It targets fast dividing cells, like cancer cells, and causes these cells to die. This medicine is used to treat ovarian cancer, breast cancer, lung cancer, Kaposi's sarcoma, and other cancers. This medicine may be used for other purposes; ask your health care provider or pharmacist if you have questions. COMMON BRAND NAME(S): Onxol, Taxol What should I tell my health care provider before I take this medicine? They need to know if you have any of these conditions:  history of irregular heartbeat  liver disease  low blood counts, like low white cell, platelet, or red cell counts  lung or breathing disease, like asthma  tingling of the fingers or toes, or other nerve disorder  an unusual or allergic reaction to paclitaxel, alcohol, polyoxyethylated castor oil, other chemotherapy, other medicines, foods, dyes, or preservatives  pregnant or trying to get pregnant  breast-feeding How should I use this medicine? This drug is given as an infusion into a vein. It is administered in a hospital or clinic by a specially trained health care professional. Talk to your pediatrician regarding the use of this medicine in children. Special care may be needed. Overdosage: If you think you have taken too much of this medicine contact a poison control center or emergency room at once. NOTE: This medicine is only for you. Do not share this medicine with others. What if I miss a dose? It is important not to miss your dose. Call your doctor or health care professional if you are unable to keep an appointment. What may interact with this medicine? Do not take this medicine with any of the following medications:  disulfiram  metronidazole This medicine may also interact with the following medications:  antiviral medicines for hepatitis, HIV or AIDS  certain antibiotics like erythromycin and  clarithromycin  certain medicines for fungal infections like ketoconazole and itraconazole  certain medicines for seizures like carbamazepine, phenobarbital, phenytoin  gemfibrozil  nefazodone  rifampin  St. John's wort This list may not describe all possible interactions. Give your health care provider a list of all the medicines, herbs, non-prescription drugs, or dietary supplements you use. Also tell them if you smoke, drink alcohol, or use illegal drugs. Some items may interact with your medicine. What should I watch for while using this medicine? Your condition will be monitored carefully while you are receiving this medicine. You will need important blood work done while you are taking this medicine. This medicine can cause serious allergic reactions. To reduce your risk you will need to take other medicine(s) before treatment with this medicine. If you experience allergic reactions like skin rash, itching or hives, swelling of the face, lips, or tongue, tell your doctor or health care professional right away. In some cases, you may be given additional medicines to help with side effects. Follow all directions for their use. This drug may make you feel generally unwell. This is not uncommon, as chemotherapy can affect healthy cells as well as cancer cells. Report any side effects. Continue your course of treatment even though you feel ill unless your doctor tells you to stop. Call your doctor or health care professional for advice if you get a fever, chills or sore throat, or other symptoms of a cold or flu. Do not treat yourself. This drug decreases your body's ability to fight infections. Try to avoid being around people who are sick. This medicine may increase your risk to bruise   or bleed. Call your doctor or health care professional if you notice any unusual bleeding. Be careful brushing and flossing your teeth or using a toothpick because you may get an infection or bleed more easily.  If you have any dental work done, tell your dentist you are receiving this medicine. Avoid taking products that contain aspirin, acetaminophen, ibuprofen, naproxen, or ketoprofen unless instructed by your doctor. These medicines may hide a fever. Do not become pregnant while taking this medicine. Women should inform their doctor if they wish to become pregnant or think they might be pregnant. There is a potential for serious side effects to an unborn child. Talk to your health care professional or pharmacist for more information. Do not breast-feed an infant while taking this medicine. Men are advised not to father a child while receiving this medicine. This product may contain alcohol. Ask your pharmacist or healthcare provider if this medicine contains alcohol. Be sure to tell all healthcare providers you are taking this medicine. Certain medicines, like metronidazole and disulfiram, can cause an unpleasant reaction when taken with alcohol. The reaction includes flushing, headache, nausea, vomiting, sweating, and increased thirst. The reaction can last from 30 minutes to several hours. What side effects may I notice from receiving this medicine? Side effects that you should report to your doctor or health care professional as soon as possible:  allergic reactions like skin rash, itching or hives, swelling of the face, lips, or tongue  breathing problems  changes in vision  fast, irregular heartbeat  high or low blood pressure  mouth sores  pain, tingling, numbness in the hands or feet  signs of decreased platelets or bleeding - bruising, pinpoint red spots on the skin, black, tarry stools, blood in the urine  signs of decreased red blood cells - unusually weak or tired, feeling faint or lightheaded, falls  signs of infection - fever or chills, cough, sore throat, pain or difficulty passing urine  signs and symptoms of liver injury like dark yellow or brown urine; general ill feeling or  flu-like symptoms; light-colored stools; loss of appetite; nausea; right upper belly pain; unusually weak or tired; yellowing of the eyes or skin  swelling of the ankles, feet, hands  unusually slow heartbeat Side effects that usually do not require medical attention (report to your doctor or health care professional if they continue or are bothersome):  diarrhea  hair loss  loss of appetite  muscle or joint pain  nausea, vomiting  pain, redness, or irritation at site where injected  tiredness This list may not describe all possible side effects. Call your doctor for medical advice about side effects. You may report side effects to FDA at 1-800-FDA-1088. Where should I keep my medicine? This drug is given in a hospital or clinic and will not be stored at home. NOTE: This sheet is a summary. It may not cover all possible information. If you have questions about this medicine, talk to your doctor, pharmacist, or health care provider.  2020 [employer]/Gold Standard (2016-12-04 13:14:55) Carboplatin injection What is this medicine? CARBOPLATIN (KAR boe pla tin) is a chemotherapy drug. It targets fast dividing cells, like cancer cells, and causes these cells to die. This medicine is used to treat ovarian cancer and many other cancers. This medicine may be used for other purposes; ask your health care provider or pharmacist if you have questions. COMMON BRAND NAME(S): Paraplatin What should I tell my health care provider before I take this medicine? They need to   know if you have any of these conditions:  blood disorders  hearing problems  kidney disease  recent or ongoing radiation therapy  an unusual or allergic reaction to carboplatin, cisplatin, other chemotherapy, other medicines, foods, dyes, or preservatives  pregnant or trying to get pregnant  breast-feeding How should I use this medicine? This drug is usually given as an infusion into a vein. It is administered in a  hospital or clinic by a specially trained health care professional. Talk to your pediatrician regarding the use of this medicine in children. Special care may be needed. Overdosage: If you think you have taken too much of this medicine contact a poison control center or emergency room at once. NOTE: This medicine is only for you. Do not share this medicine with others. What if I miss a dose? It is important not to miss a dose. Call your doctor or health care professional if you are unable to keep an appointment. What may interact with this medicine?  medicines for seizures  medicines to increase blood counts like filgrastim, pegfilgrastim, sargramostim  some antibiotics like amikacin, gentamicin, neomycin, streptomycin, tobramycin  vaccines Talk to your doctor or health care professional before taking any of these medicines:  acetaminophen  aspirin  ibuprofen  ketoprofen  naproxen This list may not describe all possible interactions. Give your health care provider a list of all the medicines, herbs, non-prescription drugs, or dietary supplements you use. Also tell them if you smoke, drink alcohol, or use illegal drugs. Some items may interact with your medicine. What should I watch for while using this medicine? Your condition will be monitored carefully while you are receiving this medicine. You will need important blood work done while you are taking this medicine. This drug may make you feel generally unwell. This is not uncommon, as chemotherapy can affect healthy cells as well as cancer cells. Report any side effects. Continue your course of treatment even though you feel ill unless your doctor tells you to stop. In some cases, you may be given additional medicines to help with side effects. Follow all directions for their use. Call your doctor or health care professional for advice if you get a fever, chills or sore throat, or other symptoms of a cold or flu. Do not treat  yourself. This drug decreases your body's ability to fight infections. Try to avoid being around people who are sick. This medicine may increase your risk to bruise or bleed. Call your doctor or health care professional if you notice any unusual bleeding. Be careful brushing and flossing your teeth or using a toothpick because you may get an infection or bleed more easily. If you have any dental work done, tell your dentist you are receiving this medicine. Avoid taking products that contain aspirin, acetaminophen, ibuprofen, naproxen, or ketoprofen unless instructed by your doctor. These medicines may hide a fever. Do not become pregnant while taking this medicine. Women should inform their doctor if they wish to become pregnant or think they might be pregnant. There is a potential for serious side effects to an unborn child. Talk to your health care professional or pharmacist for more information. Do not breast-feed an infant while taking this medicine. What side effects may I notice from receiving this medicine? Side effects that you should report to your doctor or health care professional as soon as possible:  allergic reactions like skin rash, itching or hives, swelling of the face, lips, or tongue  signs of infection - fever or   chills, cough, sore throat, pain or difficulty passing urine  signs of decreased platelets or bleeding - bruising, pinpoint red spots on the skin, black, tarry stools, nosebleeds  signs of decreased red blood cells - unusually weak or tired, fainting spells, lightheadedness  breathing problems  changes in hearing  changes in vision  chest pain  high blood pressure  low blood counts - This drug may decrease the number of white blood cells, red blood cells and platelets. You may be at increased risk for infections and bleeding.  nausea and vomiting  pain, swelling, redness or irritation at the injection site  pain, tingling, numbness in the hands or  feet  problems with balance, talking, walking  trouble passing urine or change in the amount of urine Side effects that usually do not require medical attention (report to your doctor or health care professional if they continue or are bothersome):  hair loss  loss of appetite  metallic taste in the mouth or changes in taste This list may not describe all possible side effects. Call your doctor for medical advice about side effects. You may report side effects to FDA at 1-800-FDA-1088. Where should I keep my medicine? This drug is given in a hospital or clinic and will not be stored at home. NOTE: This sheet is a summary. It may not cover all possible information. If you have questions about this medicine, talk to your doctor, pharmacist, or health care provider.  2020 [employer]vier/Gold Standard (2007-07-08 14:38:05)  

## 2019-08-18 ENCOUNTER — Encounter: Payer: Self-pay | Admitting: Pharmacist

## 2019-08-18 NOTE — Progress Notes (Signed)
Pharmacist Chemotherapy Monitoring - Follow Up Assessment    I verify that I have reviewed each item in the below checklist:  . Regimen for the patient is scheduled for the appropriate day and plan matches scheduled date. Marland Kitchen Appropriate non-routine labs are ordered dependent on drug ordered. . If applicable, additional medications reviewed and ordered per protocol based on lifetime cumulative doses and/or treatment regimen.   Plan for follow-up and/or issues identified: No . I-vent associated with next due treatment: No . MD and/or nursing notified: No   Kennith Center, Pharm.D., CPP 08/18/2019@11 :05 AM

## 2019-08-24 ENCOUNTER — Other Ambulatory Visit: Payer: Self-pay | Admitting: Hematology and Oncology

## 2019-08-24 ENCOUNTER — Other Ambulatory Visit: Payer: Self-pay

## 2019-08-24 ENCOUNTER — Inpatient Hospital Stay: Payer: Medicare Other | Attending: Hematology and Oncology

## 2019-08-24 ENCOUNTER — Inpatient Hospital Stay: Payer: Medicare Other

## 2019-08-24 ENCOUNTER — Inpatient Hospital Stay (HOSPITAL_BASED_OUTPATIENT_CLINIC_OR_DEPARTMENT_OTHER): Payer: Medicare Other | Admitting: Hematology and Oncology

## 2019-08-24 ENCOUNTER — Encounter: Payer: Self-pay | Admitting: Hematology and Oncology

## 2019-08-24 DIAGNOSIS — Z90710 Acquired absence of both cervix and uterus: Secondary | ICD-10-CM | POA: Insufficient documentation

## 2019-08-24 DIAGNOSIS — N189 Chronic kidney disease, unspecified: Secondary | ICD-10-CM | POA: Insufficient documentation

## 2019-08-24 DIAGNOSIS — R110 Nausea: Secondary | ICD-10-CM | POA: Insufficient documentation

## 2019-08-24 DIAGNOSIS — C541 Malignant neoplasm of endometrium: Secondary | ICD-10-CM | POA: Insufficient documentation

## 2019-08-24 DIAGNOSIS — T451X5A Adverse effect of antineoplastic and immunosuppressive drugs, initial encounter: Secondary | ICD-10-CM | POA: Insufficient documentation

## 2019-08-24 DIAGNOSIS — D6481 Anemia due to antineoplastic chemotherapy: Secondary | ICD-10-CM | POA: Insufficient documentation

## 2019-08-24 DIAGNOSIS — G629 Polyneuropathy, unspecified: Secondary | ICD-10-CM

## 2019-08-24 DIAGNOSIS — D539 Nutritional anemia, unspecified: Secondary | ICD-10-CM

## 2019-08-24 DIAGNOSIS — N179 Acute kidney failure, unspecified: Secondary | ICD-10-CM

## 2019-08-24 DIAGNOSIS — Z5111 Encounter for antineoplastic chemotherapy: Secondary | ICD-10-CM | POA: Diagnosis not present

## 2019-08-24 DIAGNOSIS — M791 Myalgia, unspecified site: Secondary | ICD-10-CM | POA: Diagnosis not present

## 2019-08-24 DIAGNOSIS — Z90722 Acquired absence of ovaries, bilateral: Secondary | ICD-10-CM | POA: Insufficient documentation

## 2019-08-24 LAB — CMP (CANCER CENTER ONLY)
ALT: 19 U/L (ref 0–44)
AST: 15 U/L (ref 15–41)
Albumin: 3.7 g/dL (ref 3.5–5.0)
Alkaline Phosphatase: 46 U/L (ref 38–126)
Anion gap: 12 (ref 5–15)
BUN: 12 mg/dL (ref 8–23)
CO2: 27 mmol/L (ref 22–32)
Calcium: 9.3 mg/dL (ref 8.9–10.3)
Chloride: 103 mmol/L (ref 98–111)
Creatinine: 1.33 mg/dL — ABNORMAL HIGH (ref 0.44–1.00)
GFR, Est AFR Am: 46 mL/min — ABNORMAL LOW (ref >60)
GFR, Estimated: 40 mL/min — ABNORMAL LOW (ref >60)
Glucose, Bld: 148 mg/dL — ABNORMAL HIGH (ref 70–99)
Potassium: 3.9 mmol/L (ref 3.5–5.1)
Sodium: 142 mmol/L (ref 135–145)
Total Bilirubin: 0.3 mg/dL (ref 0.3–1.2)
Total Protein: 6.7 g/dL (ref 6.5–8.1)

## 2019-08-24 LAB — CBC WITH DIFFERENTIAL (CANCER CENTER ONLY)
Abs Immature Granulocytes: 0.08 10*3/uL — ABNORMAL HIGH (ref 0.00–0.07)
Basophils Absolute: 0 10*3/uL (ref 0.0–0.1)
Basophils Relative: 0 %
Eosinophils Absolute: 0 10*3/uL (ref 0.0–0.5)
Eosinophils Relative: 0 %
HCT: 35.4 % — ABNORMAL LOW (ref 36.0–46.0)
Hemoglobin: 11.3 g/dL — ABNORMAL LOW (ref 12.0–15.0)
Immature Granulocytes: 1 %
Lymphocytes Relative: 7 %
Lymphs Abs: 0.5 10*3/uL — ABNORMAL LOW (ref 0.7–4.0)
MCH: 29.4 pg (ref 26.0–34.0)
MCHC: 31.9 g/dL (ref 30.0–36.0)
MCV: 91.9 fL (ref 80.0–100.0)
Monocytes Absolute: 0.2 10*3/uL (ref 0.1–1.0)
Monocytes Relative: 3 %
Neutro Abs: 5.8 10*3/uL (ref 1.7–7.7)
Neutrophils Relative %: 89 %
Platelet Count: 382 10*3/uL (ref 150–400)
RBC: 3.85 MIL/uL — ABNORMAL LOW (ref 3.87–5.11)
RDW: 13.9 % (ref 11.5–15.5)
WBC Count: 6.5 10*3/uL (ref 4.0–10.5)
nRBC: 0 % (ref 0.0–0.2)

## 2019-08-24 LAB — VITAMIN B12: Vitamin B-12: 1275 pg/mL — ABNORMAL HIGH (ref 180–914)

## 2019-08-24 MED ORDER — SODIUM CHLORIDE 0.9 % IV SOLN
Freq: Once | INTRAVENOUS | Status: AC
  Filled 2019-08-24: qty 250

## 2019-08-24 MED ORDER — SODIUM CHLORIDE 0.9% FLUSH
10.0000 mL | INTRAVENOUS | Status: DC | PRN
  Administered 2019-08-24: 10 mL
  Filled 2019-08-24: qty 10

## 2019-08-24 MED ORDER — SODIUM CHLORIDE 0.9 % IV SOLN
52.5000 mg/m2 | Freq: Once | INTRAVENOUS | Status: AC
  Administered 2019-08-24: 120 mg via INTRAVENOUS
  Filled 2019-08-24: qty 20

## 2019-08-24 MED ORDER — SODIUM CHLORIDE 0.9 % IV SOLN
10.0000 mg | Freq: Once | INTRAVENOUS | Status: AC
  Administered 2019-08-24: 10 mg via INTRAVENOUS
  Filled 2019-08-24: qty 10

## 2019-08-24 MED ORDER — SODIUM CHLORIDE 0.9 % IV SOLN
150.0000 mg | Freq: Once | INTRAVENOUS | Status: AC
  Administered 2019-08-24: 150 mg via INTRAVENOUS
  Filled 2019-08-24: qty 150

## 2019-08-24 MED ORDER — SODIUM CHLORIDE 0.9 % IV SOLN
590.0000 mg | Freq: Once | INTRAVENOUS | Status: AC
  Administered 2019-08-24: 16:00:00 590 mg via INTRAVENOUS
  Filled 2019-08-24: qty 59

## 2019-08-24 MED ORDER — FAMOTIDINE IN NACL 20-0.9 MG/50ML-% IV SOLN
20.0000 mg | Freq: Once | INTRAVENOUS | Status: AC
  Administered 2019-08-24: 11:00:00 20 mg via INTRAVENOUS

## 2019-08-24 MED ORDER — DIPHENHYDRAMINE HCL 50 MG/ML IJ SOLN
50.0000 mg | Freq: Once | INTRAMUSCULAR | Status: AC
  Administered 2019-08-24: 50 mg via INTRAVENOUS

## 2019-08-24 MED ORDER — FAMOTIDINE IN NACL 20-0.9 MG/50ML-% IV SOLN
INTRAVENOUS | Status: AC
  Filled 2019-08-24: qty 50

## 2019-08-24 MED ORDER — DIPHENHYDRAMINE HCL 50 MG/ML IJ SOLN
INTRAMUSCULAR | Status: AC
  Filled 2019-08-24: qty 1

## 2019-08-24 MED ORDER — PALONOSETRON HCL INJECTION 0.25 MG/5ML
INTRAVENOUS | Status: AC
  Filled 2019-08-24: qty 5

## 2019-08-24 MED ORDER — HEPARIN SOD (PORK) LOCK FLUSH 100 UNIT/ML IV SOLN
500.0000 [IU] | Freq: Once | INTRAVENOUS | Status: AC | PRN
  Administered 2019-08-24: 500 [IU]
  Filled 2019-08-24: qty 5

## 2019-08-24 MED ORDER — PALONOSETRON HCL INJECTION 0.25 MG/5ML
0.2500 mg | Freq: Once | INTRAVENOUS | Status: AC
  Administered 2019-08-24: 0.25 mg via INTRAVENOUS

## 2019-08-24 MED ORDER — SODIUM CHLORIDE 0.9% FLUSH
10.0000 mL | Freq: Once | INTRAVENOUS | Status: AC
  Administered 2019-08-24: 10 mL
  Filled 2019-08-24: qty 10

## 2019-08-24 NOTE — Assessment & Plan Note (Signed)
She tolerated chemotherapy very poorly with significant nausea, diffuse body aches, intermittent low-grade fever although she did not check her temperature for this, excessive fatigue and worsening neuropathy We will proceed with treatment today with further dose adjustment of Taxol If her symptoms remain the same in her next visit, I plan to drop paclitaxel altogether

## 2019-08-24 NOTE — Patient Instructions (Signed)
[facility] °Discharge Instructions for Patients Receiving Chemotherapy ° °Today you received the following chemotherapy agents taxol; carboplatin ° °To help prevent nausea and vomiting after your treatment, we encourage you to take your nausea medication as directed °  °If you develop nausea and vomiting that is not controlled by your nausea medication, call the clinic.  ° °BELOW ARE SYMPTOMS THAT SHOULD BE REPORTED IMMEDIATELY: °· *FEVER GREATER THAN 100.5 F °· *CHILLS WITH OR WITHOUT FEVER °· NAUSEA AND VOMITING THAT IS NOT CONTROLLED WITH YOUR NAUSEA MEDICATION °· *UNUSUAL SHORTNESS OF BREATH °· *UNUSUAL BRUISING OR BLEEDING °· TENDERNESS IN MOUTH AND THROAT WITH OR WITHOUT PRESENCE OF ULCERS °· *URINARY PROBLEMS °· *BOWEL PROBLEMS °· UNUSUAL RASH °Items with * indicate a potential emergency and should be followed up as soon as possible. ° °Feel free to call the clinic should you have any questions or concerns. The clinic phone number is [phone]. ° °Please show the CHEMO ALERT CARD at check-in to the Emergency Department and triage nurse. ° ° °

## 2019-08-24 NOTE — Assessment & Plan Note (Signed)
She complain of severe worsening neuropathy while on treatment This is despite 50% dose adjustment I plan to reduce the dose of Taxol further to about 30% of calculated dose If she continues to experience worsening neuropathy, I plan to discontinue paclitaxel altogether

## 2019-08-24 NOTE — Assessment & Plan Note (Signed)
She complained of excessive fatigue even though she has only mild anemia from treatment We will observe for now

## 2019-08-24 NOTE — Assessment & Plan Note (Signed)
Her renal function is stable We will continue to monitor her kidney function carefully

## 2019-08-24 NOTE — Patient Instructions (Signed)

## 2019-08-24 NOTE — Progress Notes (Signed)
[place] OFFICE PROGRESS NOTE  Patient Care Team: Patient, No Pcp Per as PCP - General (General Practice) Awanda Mink, Craige Cotta, RN as Oncology Nurse Navigator (Oncology)  ASSESSMENT & PLAN:  Endometrial cancer [facility]) She tolerated chemotherapy very poorly with significant nausea, diffuse body aches, intermittent low-grade fever although she did not check her temperature for this, excessive fatigue and worsening neuropathy We will proceed with treatment today with further dose adjustment of Taxol If her symptoms remain the same in her next visit, I plan to drop paclitaxel altogether  Acute on chronic renal failure (HCC) Her renal function is stable We will continue to monitor her kidney function carefully  Peripheral polyneuropathy She complain of severe worsening neuropathy while on treatment This is despite 50% dose adjustment I plan to reduce the dose of Taxol further to about 30% of calculated dose If she continues to experience worsening neuropathy, I plan to discontinue paclitaxel altogether  Anemia due to antineoplastic chemotherapy She complained of excessive fatigue even though she has only mild anemia from treatment We will observe for now   No orders of the defined types were placed in this encounter.   All questions were answered. The patient knows to call the clinic with any problems, questions or concerns. The total time spent in the appointment was 30 minutes encounter with patients including review of chart and various tests results, discussions about plan of care and coordination of care plan   Heath Lark, MD 08/24/2019 11:39 AM  INTERVAL HISTORY: Please see below for problem oriented charting. She returns for cycle 2 of treatment She tolerated cycle 1 poorly She have diffuse body aches and pain throughout but is refraining from taking narcotic prescription She complained of excessive fatigue She also felt that she had some fever within a few days  after her first dose of treatment but she did not take her temperature She has significant nausea but no vomiting Her nausea was alleviated by antiemetics She denies constipation or diarrhea She have severe worsening neuropathy after treatment She has occasional cough but nonproductive, usually in the morning  SUMMARY OF ONCOLOGIC HISTORY: Oncology History Overview Note  Endometrioid, MSI High, MLH1 promoter hypermethylation present   Endometrial cancer ([place])  05/18/2019 Initial Biopsy   EMB: Complex atypical hyperplasia, cannot rule out microscopic focus of FIGO grade 1 carcinoma   05/18/2019 Initial Diagnosis   Presented in February 2021 with postmenopausal bleeding.  She was started on Provera and underwent endometrial biopsy showing complex atypical hyperplasia with a possible focus suspicious for grade 1 endometrial cancer.   05/21/2019 Imaging   Pelvic ultrasound at [facility]: Anteverted uterus measuring 8.2 cm in greatest dimension.  Endometrial lining measures 1.73 cm and is thickened.  Left ovary not visualized, right ovary normal in appearance.   06/30/2019 Surgery   Robotic-assisted laparoscopic total hysterectomy with bilateral salpingoophorectomy, bilateral pelvic LND  Frozen section showed endometrial cancer with more than 50% myometrial invasion.  Patient did not map to a sentinel lymph node bilaterally.   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  Tumor measures 3.5 cm and invades focally into the serosa.  No involvement of the cervix or bilateral adnexa.  1 of 20 lymph nodes positive for metastatic carcinoma, focus measures 1 cm without evidence of extracapsular extension.  FINAL MICROSCOPIC DIAGNOSIS: A. UTERUS, CERVIX, BILATERAL FALLOPIAN TUBES AND OVARIES, HYSTERECTOMY WITH SALPINGECTOMY: - Endometrioid adenocarcinoma, 3.5 cm, FIGO grade 1 - Carcinoma invades through the entire  myometrium and focally into the serosal  surface - Benign unremarkable cervix - Benign unremarkable bilateral fallopian tubes and left ovary - Benign serous inclusion cyst of right ovary - See oncology table B. LYMPH NODE, RIGHT PELVIC, BIOPSY: - Metastatic carcinoma to one of twelve lymph nodes (1/12) - Focus of metastatic carcinoma measures 1.0 cm, without evidence of extracapsular extension C. LYMPH NODE, LEFT PELVIC, BIOPSY: - Eight benign lymph nodes (0/8) ONCOLOGY TABLE: UTERUS, CARCINOMA OR CARCINOSARCOMA Procedure: Total hysterectomy and bilateral salpingo-oophorectomy Histologic type: Endometrioid adenocarcinoma Histologic Grade: FIGO grade 1 Myometrial invasion: Depth of invasion: 30 mm Myometrial thickness: 30 mm Uterine Serosa Involvement: Present, focal Cervical stromal involvement: Not identified Extent of involvement of other organs: Not identified Lymphovascular invasion: Not identified Regional Lymph Nodes: Examined: 0 Sentinel 20 Non-sentinel 20 Total Lymph nodes with metastasis: 1 Isolated tumor cells (<0.2 mm): 0 Micrometastasis: (>0.2 mm and < 2.0 mm): 0 Macrometastasis: (>2.0 mm): 1 Extracapsular extension: Not identified Representative Tumor Block: A6 MMR / MSI testing: Will be ordered Pathologic Stage Classification (pTNM, AJCC 8th edition): pT3, pN1a Comments: None Mismatch Repair Protein (IHC) SUMMARY INTERPRETATION: ABNORMAL 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 The loss of expression may be secondary to promoter hyper-methylation, gene mutation or other genetic event. BRAF mutation testing and/or MLH1 methylation testing is indicated. The presence of a BRAF mutation and/or MLH1 hypermethylation is indicative of a sporadic-type tumor. The absence of either BRAF mutation and/or presence of normal methylation indicate the possible presence of a hereditary germline mutation (e.g.Lynch syndrome) and referral to genetic counseling is warranted. It is recommended that the  loss of protein expression be correlated with molecular based MSI testing   06/30/2019 Cancer Staging   Staging form: Corpus Uteri - Carcinoma and Carcinosarcoma, AJCC 8th Edition - Clinical stage from 06/30/2019: FIGO Stage IIIC1 (cT3, cN1a, cM0) - Signed by Lafonda Mosses, MD on 07/09/2019   07/16/2019 Imaging   1. No evidence of metastatic disease in the chest, abdomen or pelvis. 2. Status post hysterectomy. Small volume simple ascites. Small amount of ill-defined fluid in the pelvic sidewalls, compatible with recent surgery. 3. Small umbilical hernia containing fat and trace ascitic fluid. 4. Chronic findings include: Aortic Atherosclerosis (ICD10-I70.0). Small hiatal hernia. Mild sigmoid diverticulosis   07/29/2019 Procedure   Ultrasound and fluoroscopically guided right internal jugular single lumen power port catheter insertion. Tip in the SVC/RA junction. Catheter ready for use.   08/03/2019 -  Chemotherapy   The patient had carboplatin and taxol for chemotherapy treatment.       REVIEW OF SYSTEMS:   Eyes: Denies blurriness of vision Ears, nose, mouth, throat, and face: Denies mucositis or sore throat Cardiovascular: Denies palpitation, chest discomfort or lower extremity swelling Gastrointestinal:  Denies nausea, heartburn or change in bowel habits Skin: Denies abnormal skin rashes Lymphatics: Denies new lymphadenopathy or easy bruising Behavioral/Psych: Mood is stable, no new changes  All other systems were reviewed with the patient and are negative.  I have reviewed the past medical history, past surgical history, social history and family history with the patient and they are unchanged from previous note.  ALLERGIES:  is allergic to amoxicillin and latex.  MEDICATIONS:  Current Outpatient Medications  Medication Sig Dispense Refill  . AMITIZA 24 MCG capsule Take 48 mcg by mouth every evening.     . B Complex Vitamins (B COMPLEX PO) Take 1 Dose by mouth daily. Liquid b  complex    . benazepril-hydrochlorthiazide (  LOTENSIN HCT) 20-25 MG tablet Take 1 tablet by mouth every evening.     Marland Kitchen dexamethasone (DECADRON) 4 MG tablet Take 2 tabs at the night before and 2 tab the morning of chemotherapy, every 3 weeks, by mouth x 6 cycles total 24 tablet 0  . diphenhydrAMINE (BENADRYL) 25 MG tablet Take 50 mg by mouth daily as needed for allergies.    Marland Kitchen gabapentin (NEURONTIN) 600 MG tablet Take 600-1,200 mg by mouth See admin instructions. Take 1200 mg in the morning, 600 mg in the afternoon, and 600 mg at night    . lidocaine-prilocaine (EMLA) cream Apply to affected area once 30 g 3  . Melatonin 5 MG CAPS Take 5 mg by mouth at bedtime.    . meloxicam (MOBIC) 15 MG tablet Take 15 mg by mouth daily in the afternoon.     . methocarbamol (ROBAXIN) 500 MG tablet Take 1,000 mg by mouth 2 (two) times daily as needed.    Marland Kitchen ofloxacin (OCUFLOX) 0.3 % ophthalmic solution Place 1 drop into the right eye 4 (four) times daily.     Marland Kitchen omeprazole (PRILOSEC OTC) 20 MG tablet Take 20 mg by mouth every evening.    . ondansetron (ZOFRAN) 8 MG tablet Take 1 tablet (8 mg total) by mouth every 8 (eight) hours as needed. Start on the third day after chemotherapy. 30 tablet 1  . oxyCODONE (OXY IR/ROXICODONE) 5 MG immediate release tablet Take 1 tablet (5 mg total) by mouth every 4 (four) hours as needed for severe pain. Do not take with tramadol and do not take and drive (Patient not taking: Reported on 08/24/2019) 10 tablet 0  . Oxymetazoline HCl (VICKS SINEX 12 HOUR NA) Place 1 spray into the nose 2 (two) times daily as needed (congestion).    . prochlorperazine (COMPAZINE) 10 MG tablet Take 1 tablet (10 mg total) by mouth every 6 (six) hours as needed (Nausea or vomiting). 30 tablet 1  . tiZANidine (ZANAFLEX) 4 MG tablet Take 4 mg by mouth at bedtime.    . topiramate (TOPAMAX) 25 MG tablet Take 75 mg by mouth at bedtime.    . traMADol (ULTRAM) 50 MG tablet Take 1 tablet (50 mg total) by mouth every  6 (six) hours as needed for severe pain. For AFTER surgery, do not take and drive (Patient not taking: Reported on 08/24/2019) 10 tablet 0   No current facility-administered medications for this visit.   Facility-Administered Medications Ordered in Other Visits  Medication Dose Route Frequency Provider Last Rate Last Admin  . CARBOplatin (PARAPLATIN) 590 mg in sodium chloride 0.9 % 250 mL chemo infusion  590 mg Intravenous Once Alvy Bimler, Offie Pickron, MD      . dexamethasone (DECADRON) 10 mg in sodium chloride 0.9 % 50 mL IVPB  10 mg Intravenous Once Alvy Bimler, Alyx Gee, MD 204 mL/hr at 08/24/19 1136 10 mg at 08/24/19 1136  . fosaprepitant (EMEND) 150 mg in sodium chloride 0.9 % 145 mL IVPB  150 mg Intravenous Once Alvy Bimler, Prakriti Carignan, MD      . heparin lock flush 100 unit/mL  500 Units Intracatheter Once PRN Alvy Bimler, Sona Nations, MD      . PACLitaxel (TAXOL) 120 mg in sodium chloride 0.9 % 250 mL chemo infusion (> 46m/m2)  52.5 mg/m2 (Treatment Plan Recorded) Intravenous Once Dasie Chancellor, MD      . sodium chloride flush (NS) 0.9 % injection 10 mL  10 mL Intracatheter PRN GHeath Lark MD        PHYSICAL  EXAMINATION: ECOG PERFORMANCE STATUS: 2 - Symptomatic, <50% confined to bed  Vitals:   08/24/19 1031  BP: 124/65  Pulse: 71  Resp: 18  Temp: 98.5 F (36.9 C)  SpO2: 91%   Filed Weights   08/24/19 1031  Weight: 248 lb 12.8 oz (112.9 kg)    GENERAL:alert, no distress and comfortable SKIN: skin color, texture, turgor are normal, no rashes or significant lesions EYES: normal, Conjunctiva are pink and non-injected, sclera clear OROPHARYNX:no exudate, no erythema and lips, buccal mucosa, and tongue normal  NECK: supple, thyroid normal size, non-tender, without nodularity LYMPH:  no palpable lymphadenopathy in the cervical, axillary or inguinal LUNGS: clear to auscultation and percussion with normal breathing effort HEART: regular rate & rhythm and no murmurs and no lower extremity edema ABDOMEN:abdomen soft, non-tender  and normal bowel sounds Musculoskeletal:no cyanosis of digits and no clubbing  NEURO: alert & oriented x 3 with fluent speech, no focal motor/sensory deficits  LABORATORY DATA:  I have reviewed the data as listed    Component Value Date/Time   NA 142 08/24/2019 1010   K 3.9 08/24/2019 1010   CL 103 08/24/2019 1010   CO2 27 08/24/2019 1010   GLUCOSE 148 (H) 08/24/2019 1010   BUN 12 08/24/2019 1010   CREATININE 1.33 (H) 08/24/2019 1010   CALCIUM 9.3 08/24/2019 1010   PROT 6.7 08/24/2019 1010   ALBUMIN 3.7 08/24/2019 1010   AST 15 08/24/2019 1010   ALT 19 08/24/2019 1010   ALKPHOS 46 08/24/2019 1010   BILITOT 0.3 08/24/2019 1010   GFRNONAA 40 (L) 08/24/2019 1010   GFRAA 46 (L) 08/24/2019 1010    No results found for: SPEP, UPEP  Lab Results  Component Value Date   WBC 6.5 08/24/2019   NEUTROABS 5.8 08/24/2019   HGB 11.3 (L) 08/24/2019   HCT 35.4 (L) 08/24/2019   MCV 91.9 08/24/2019   PLT 382 08/24/2019      Chemistry      Component Value Date/Time   NA 142 08/24/2019 1010   K 3.9 08/24/2019 1010   CL 103 08/24/2019 1010   CO2 27 08/24/2019 1010   BUN 12 08/24/2019 1010   CREATININE 1.33 (H) 08/24/2019 1010      Component Value Date/Time   CALCIUM 9.3 08/24/2019 1010   ALKPHOS 46 08/24/2019 1010   AST 15 08/24/2019 1010   ALT 19 08/24/2019 1010   BILITOT 0.3 08/24/2019 1010       RADIOGRAPHIC STUDIES: I have personally reviewed the radiological images as listed and agreed with the findings in the report. IR IMAGING GUIDED PORT INSERTION  Result Date: 07/29/2019 CLINICAL DATA:  ENDOMETRIAL CANCER, ACCESS FOR CHEMOTHERAPY EXAM: RIGHT INTERNAL JUGULAR SINGLE LUMEN POWER PORT CATHETER INSERTION Date:  07/29/2019 07/29/2019 3:08 pm Radiologist:  M. Daryll Brod, MD Guidance:  Ultrasound fluoroscopic MEDICATIONS: 900 mg Cleocin; The antibiotic was administered within an appropriate time interval prior to skin puncture. ANESTHESIA/SEDATION: Versed 2.0 mg IV; Fentanyl  100 mcg IV; Moderate Sedation Time:  21 minutes The patient was continuously monitored during the procedure by the interventional radiology nurse under my direct supervision. FLUOROSCOPY TIME:  0 minutes, 28 seconds (7 mGy) COMPLICATIONS: None immediate. CONTRAST:  None. PROCEDURE: Informed consent was obtained from the patient following explanation of the procedure, risks, benefits and alternatives. The patient understands, agrees and consents for the procedure. All questions were addressed. A time out was performed. Maximal barrier sterile technique utilized including caps, mask, sterile gowns, sterile gloves, large sterile drape,  hand hygiene, and 2% chlorhexidine scrub. Under sterile conditions and local anesthesia, right internal jugular micropuncture venous access was performed. Access was performed with ultrasound. Images were obtained for documentation of the patent right internal jugular vein. A guide wire was inserted followed by a transitional dilator. This allowed insertion of a guide wire and catheter into the IVC. Measurements were obtained from the SVC / RA junction back to the right IJ venotomy site. In the right infraclavicular chest, a subcutaneous pocket was created over the second anterior rib. This was done under sterile conditions and local anesthesia. 1% lidocaine with epinephrine was utilized for this. A 2.5 cm incision was made in the skin. Blunt dissection was performed to create a subcutaneous pocket over the right pectoralis major muscle. The pocket was flushed with saline vigorously. There was adequate hemostasis. The port catheter was assembled and checked for leakage. The port catheter was secured in the pocket with two retention sutures. The tubing was tunneled subcutaneously to the right venotomy site and inserted into the SVC/RA junction through a valved peel-away sheath. Position was confirmed with fluoroscopy. Images were obtained for documentation. The patient tolerated the  procedure well. No immediate complications. Incisions were closed in a two layer fashion with 4 - 0 Vicryl suture. Dermabond was applied to the skin. The port catheter was accessed, blood was aspirated followed by saline and heparin flushes. Needle was removed. A dry sterile dressing was applied. IMPRESSION: Ultrasound and fluoroscopically guided right internal jugular single lumen power port catheter insertion. Tip in the SVC/RA junction. Catheter ready for use. Electronically Signed   By: Jerilynn Mages.  Shick M.D.   On: 07/29/2019 15:23

## 2019-08-25 ENCOUNTER — Telehealth: Payer: Self-pay | Admitting: Oncology

## 2019-08-25 ENCOUNTER — Telehealth: Payer: Self-pay | Admitting: Hematology and Oncology

## 2019-08-25 NOTE — Telephone Encounter (Signed)
Scheduled appts per 5/10 sch msg. Pt confirmed appt date and time.

## 2019-08-25 NOTE — Telephone Encounter (Signed)
[facility] and discussed the plan for radiation treatments (HDR brachytherapy).  She said she is feeling good and is ready to have them scheduled.  Advised her that Enid Derry, Art therapist from Radiation will call her with the appointments.  She verbalized agreement.

## 2019-08-31 ENCOUNTER — Telehealth: Payer: Self-pay | Admitting: Oncology

## 2019-08-31 ENCOUNTER — Telehealth: Payer: Self-pay | Admitting: *Deleted

## 2019-08-31 NOTE — Telephone Encounter (Signed)
Called patient to inform of New [facility], spoke with patient and she is aware of these appts. 

## 2019-08-31 NOTE — Telephone Encounter (Signed)
Left a message regarding radiation appointments.  Requested a return call.

## 2019-09-01 NOTE — Telephone Encounter (Signed)
Heidi Avila called back. Discussed the appointment for her first HDR treatment on 09/08/19.  Heidi Avila said she is supposed to work on that day and is wondering if it can be scheduled for a Monday, Wednesday or Thursday.  Advised her that I will check with Radiation and we will call her back.

## 2019-09-02 ENCOUNTER — Telehealth: Payer: Self-pay

## 2019-09-02 ENCOUNTER — Telehealth: Payer: Self-pay | Admitting: *Deleted

## 2019-09-02 NOTE — Telephone Encounter (Signed)
CALLED PATIENT TO INFORM OF NEW HDR [facility], SPOKE WITH PATIENT AND SHE IS AWARE OF THESE APPTS. °

## 2019-09-03 ENCOUNTER — Other Ambulatory Visit: Payer: Self-pay | Admitting: Hematology and Oncology

## 2019-09-03 DIAGNOSIS — C541 Malignant neoplasm of endometrium: Secondary | ICD-10-CM

## 2019-09-03 MED ORDER — ONDANSETRON HCL 8 MG PO TABS: 8.0000 mg | ORAL_TABLET | Freq: Three times a day (TID) | ORAL | 3 refills | Status: DC | PRN

## 2019-09-04 ENCOUNTER — Telehealth: Payer: Self-pay | Admitting: *Deleted

## 2019-09-04 NOTE — Progress Notes (Signed)
Patient here today for a f/u visit with Dr. Sondra Come and a  New HDR [facility].  Pain: No  Fatigue: mild fattigue  Nausea, vomiting diarrhea: nausea and vomiting 1 week ago.  Bladder Issues: Feels she is not totally emptying her bladder.  Vaginal or rectal bleeding: None  Skin irritation:None   BP (!) 144/70 (BP Location: Right Arm, Patient Position: Sitting, Cuff Size: Large)   Pulse 72   Temp (!) 97 F (36.1 C)   Resp (!) 24   Ht 5\' 3"  (1.6 m)   Wt 243 lb 3.2 oz (110.3 kg)   SpO2 97%   BMI 43.08 kg/m     Wt Readings from Last 3 Encounters:  09/07/19 243 lb 3.2 oz (110.3 kg)  08/24/19 248 lb 12.8 oz (112.9 kg)  08/03/19 243 lb 8 oz (110.5 kg)

## 2019-09-04 NOTE — Telephone Encounter (Signed)
Called patient to remind of New HDR [facility] for 09-07-19, spoke with patient and she is aware of these appts.

## 2019-09-06 NOTE — Progress Notes (Addendum)
Radiation Oncology         [phone] ________________________________  Vaginal Brachytherapy Procedure Note  Name: Heidi Avila MRN: [id number]  Date: 09/07/2019  DOB: 12-01-1945    ICD-10-CM   1. Endometrial cancer (HCC)  C54.1     Diagnosis: Stage IIIC1grade 1 endometrioid endometrial adenocarcinoma  Narrative: The patient returns today for vaginal cylinder fitting. She was seen in consultation on 07/22/2019. At that time, the patient was to proceed with adjuvant chemotherapy with Carboplatin and Taxol under the direction of Dr. Alvy Bimler. She has now completed two cycles and has been experiencing excessive fatigue, significant nausea, diffuse body aches, intermittent low-grade fever, anemia, and worsening peripheral neuropathy. Of note, Taxol was reduced to about 30% of the calculated dose. It will be discontinued if the patient continues to experience worsening neuropathy.   On review of systems, the patient reports feelings of incomplete emptying of the bladder. The patient denies pelvic pain or vaginal bleeding or discharge.  She does complain of right knee pain and is wishing to undergo an injection.  I recommend she discuss this injection with medical oncology.  Allergies:  is allergic to amoxicillin and latex.  Meds: Current Outpatient Medications  Medication Sig Dispense Refill  . AMITIZA 24 MCG capsule Take 48 mcg by mouth every evening.     . B Complex Vitamins (B COMPLEX PO) Take 1 Dose by mouth daily. Liquid b complex    . benazepril-hydrochlorthiazide (LOTENSIN HCT) 20-25 MG tablet Take 1 tablet by mouth every evening.     Marland Kitchen dexamethasone (DECADRON) 4 MG tablet Take 2 tabs at the night before and 2 tab the morning of chemotherapy, every 3 weeks, by mouth x 6 cycles total 24 tablet 0  . diphenhydrAMINE (BENADRYL) 25 MG tablet Take 50 mg by mouth daily as needed for allergies.    Marland Kitchen gabapentin (NEURONTIN) 600 MG tablet Take 600-1,200 mg by mouth See admin instructions.  Take 1200 mg in the morning, 600 mg in the afternoon, and 600 mg at night    . lidocaine-prilocaine (EMLA) cream Apply to affected area once 30 g 3  . Melatonin 5 MG CAPS Take 5 mg by mouth at bedtime.    . meloxicam (MOBIC) 15 MG tablet Take 15 mg by mouth daily in the afternoon.     . methocarbamol (ROBAXIN) 500 MG tablet Take 1,000 mg by mouth 2 (two) times daily as needed.    Marland Kitchen ofloxacin (OCUFLOX) 0.3 % ophthalmic solution Place 1 drop into the right eye 4 (four) times daily.     Marland Kitchen omeprazole (PRILOSEC OTC) 20 MG tablet Take 20 mg by mouth every evening.    . ondansetron (ZOFRAN) 8 MG tablet Take 1 tablet (8 mg total) by mouth every 8 (eight) hours as needed. Start on the third day after chemotherapy. 60 tablet 3  . oxyCODONE (OXY IR/ROXICODONE) 5 MG immediate release tablet Take 1 tablet (5 mg total) by mouth every 4 (four) hours as needed for severe pain. Do not take with tramadol and do not take and drive (Patient not taking: Reported on 08/24/2019) 10 tablet 0  . Oxymetazoline HCl (VICKS SINEX 12 HOUR NA) Place 1 spray into the nose 2 (two) times daily as needed (congestion).    . prochlorperazine (COMPAZINE) 10 MG tablet Take 1 tablet (10 mg total) by mouth every 6 (six) hours as needed (Nausea or vomiting). 30 tablet 1  . tiZANidine (ZANAFLEX) 4 MG tablet Take 4 mg by mouth at bedtime.    Marland Kitchen  topiramate (TOPAMAX) 25 MG tablet Take 75 mg by mouth at bedtime.    . traMADol (ULTRAM) 50 MG tablet Take 1 tablet (50 mg total) by mouth every 6 (six) hours as needed for severe pain. For AFTER surgery, do not take and drive (Patient not taking: Reported on 08/24/2019) 10 tablet 0   No current facility-administered medications for this encounter.    Physical Findings:  The patient is in no acute distress. Patient is alert and oriented.  height is 5\' 3"  (1.6 m) and weight is 243 lb 3.2 oz (110.3 kg). Her temperature is 97 F (36.1 C) (abnormal). Her blood pressure is 144/70 (abnormal) and her pulse  is 72. Her respiration is 24 (abnormal) and oxygen saturation is 97%.   Lungs are clear to auscultation bilaterally. Heart has regular rate and rhythm. No palpable cervical, supraclavicular, or axillary adenopathy. Abdomen soft, non-tender, normal bowel sounds. On pelvic examination the external genitalia were unremarkable. A speculum exam was performed. There are no mucosal lesions noted in the vaginal vault. On bimanual  examination there were no pelvic masses appreciated.  Vaginal cuff intact.  Lab Findings: Lab Results  Component Value Date   WBC 6.5 08/24/2019   HGB 11.3 (L) 08/24/2019   HCT 35.4 (L) 08/24/2019   MCV 91.9 08/24/2019   PLT 382 08/24/2019   . Radiographic Findings: No results found.  Impression: Stage IIIC1grade 1 endometrioid endometrial adenocarcinoma  Patient is now ready to proceed with vaginal brachytherapy as a component of her adjuvant treatment.  Patient was fitted for her vaginal cylinder.  A 3 cm diameter cylinder will be used for treatment.  This distended the vaginal vault without any undue discomfort.  Latex free equipment will be used for her treatment in light of her allergy to this product.  Plan: The patient will proceed with CT simulation and vaginal brachytherapy today.  Anticipate 5 high-dose-rate treatments on a weekly basis.  Iridium 192 will be the high-dose-rate source. patient will continue with adjuvant chemotherapy.  -----------------------------------  [name], PhD, MD  This document serves as a record of services personally performed by Gery Pray, MD. It was created on his behalf by Clerance Lav, a trained medical scribe. The creation of this record is based on the scribe's personal observations and the provider's statements to them. This document has been checked and approved by the attending provider.

## 2019-09-06 NOTE — Progress Notes (Signed)
  Radiation Oncology         (336) [phone] ________________________________  Name: Heidi Avila MRN: [id number]  Date: 09/07/2019  DOB: [birth year]  SIMULATION AND TREATMENT PLANNING NOTE HDR BRACHYTHERAPY  DIAGNOSIS: Stage IIIC1grade 1 endometrioid endometrial adenocarcinoma  NARRATIVE:  The patient was brought to the [place] suite.  Identity was confirmed.  All relevant records and images related to the planned course of therapy were reviewed.  The patient freely provided informed written consent to proceed with treatment after reviewing the details related to the planned course of therapy. The consent form was witnessed and verified by the simulation staff.  Then, the patient was set-up in a stable reproducible  supine position for radiation therapy.  CT images were obtained.  Surface markings were placed.  The CT images were loaded into the planning software.  Then the target and avoidance structures were contoured.  Treatment planning then occurred.  The radiation prescription was entered and confirmed.   I have requested : Brachytherapy Isodose Plan and Dosimetry Calculations to plan the radiation distribution.    PLAN:  The patient will receive 30 Gy in 5 fractions.  Patient will be treated with a 3 cm diameter segmented cylinder with a treatment length of 3 cm.  Prescription will be to the mucosal surface.  Iridium 192 will be the high-dose-rate source.  Latex free equipment will be used for the patient's treatment  ________________________________   [name], PhD, MD  This document serves as a record of services personally performed by Gery Pray, MD. It was created on his behalf by Clerance Lav, a trained medical scribe. The creation of this record is based on the scribe's personal observations and the provider's statements to them. This document has been checked and approved by the attending provider.

## 2019-09-07 ENCOUNTER — Ambulatory Visit
Admission: RE | Admit: 2019-09-07 | Discharge: 2019-09-07 | Disposition: A | Discharge status: Home / Self Care | Payer: Medicare Other | Source: Ambulatory Visit | Attending: Radiation Oncology | Admitting: Radiation Oncology

## 2019-09-07 ENCOUNTER — Encounter: Payer: Self-pay | Admitting: Radiation Oncology

## 2019-09-07 ENCOUNTER — Other Ambulatory Visit: Payer: Self-pay

## 2019-09-07 DIAGNOSIS — C541 Malignant neoplasm of endometrium: Secondary | ICD-10-CM | POA: Insufficient documentation

## 2019-09-07 DIAGNOSIS — R110 Nausea: Secondary | ICD-10-CM | POA: Insufficient documentation

## 2019-09-07 DIAGNOSIS — R5383 Other fatigue: Secondary | ICD-10-CM | POA: Diagnosis not present

## 2019-09-07 DIAGNOSIS — Z79899 Other long term (current) drug therapy: Secondary | ICD-10-CM | POA: Insufficient documentation

## 2019-09-07 DIAGNOSIS — R339 Retention of urine, unspecified: Secondary | ICD-10-CM | POA: Insufficient documentation

## 2019-09-07 DIAGNOSIS — D649 Anemia, unspecified: Secondary | ICD-10-CM | POA: Diagnosis not present

## 2019-09-07 DIAGNOSIS — R509 Fever, unspecified: Secondary | ICD-10-CM | POA: Diagnosis not present

## 2019-09-07 DIAGNOSIS — M25561 Pain in right knee: Secondary | ICD-10-CM | POA: Diagnosis not present

## 2019-09-07 DIAGNOSIS — R531 Weakness: Secondary | ICD-10-CM | POA: Insufficient documentation

## 2019-09-07 DIAGNOSIS — G629 Polyneuropathy, unspecified: Secondary | ICD-10-CM | POA: Diagnosis not present

## 2019-09-07 NOTE — Progress Notes (Signed)
  Radiation Oncology         [phone] ________________________________  Name: Heidi Avila MRN: [id number]  Date: 09/07/2019  DOB: [birth year]  CC: Center, [facility]  Berline Lopes, Corinna Lines, MD  HDR BRACHYTHERAPY NOTE  DIAGNOSIS: Stage IIIC1grade 1 endometrioid endometrial adenocarcinoma   Simple treatment device note: Patient had construction of her custom vaginal cylinder. She will be treated with a 3.0 cm diameter segmented cylinder. This conforms to her anatomy without undue discomfort.  Vaginal brachytherapy procedure node: The patient was brought to the [place] suite. Identity was confirmed. All relevant records and images related to the planned course of therapy were reviewed. The patient freely provided informed written consent to proceed with treatment after reviewing the details related to the planned course of therapy. The consent form was witnessed and verified by the simulation staff. Then, the patient was set-up in a stable reproducible supine position for radiation therapy. Pelvic exam revealed the vaginal cuff to be intact . The patient's custom vaginal cylinder was placed in the proximal vagina. This was affixed to the CT/MR stabilization plate to prevent slippage. Patient tolerated the placement well.  Verification simulation note:  A fiducial marker was placed within the vaginal cylinder. An AP and lateral film was then obtained through the pelvis area. This documented accurate position of the vaginal cylinder for treatment.  HDR BRACHYTHERAPY TREATMENT  The remote afterloading device was affixed to the vaginal cylinder by catheter. Patient then proceeded to undergo her first high-dose-rate treatment directed at the proximal vagina. The patient was prescribed a dose of 6.0 gray to be delivered to the mucosal surface. Treatment length was 3.0 cm. Patient was treated with 1 channel using 7 dwell positions. Treatment time was 257.0 seconds. Iridium 192 was the  high-dose-rate source for treatment. The patient tolerated the treatment well. After completion of her therapy, a radiation survey was performed documenting return of the iridium source into the [employer] safe.   PLAN: Patient will return next week for her second high-dose-rate treatment. ________________________________  [name], PhD, MD

## 2019-09-08 ENCOUNTER — Encounter: Payer: Self-pay | Admitting: Radiation Oncology

## 2019-09-08 ENCOUNTER — Ambulatory Visit: Payer: Medicare Other | Admitting: Radiation Oncology

## 2019-09-08 ENCOUNTER — Ambulatory Visit: Payer: Self-pay | Admitting: Radiation Oncology

## 2019-09-09 ENCOUNTER — Other Ambulatory Visit: Payer: Self-pay

## 2019-09-15 ENCOUNTER — Ambulatory Visit: Payer: Medicare Other | Admitting: Radiation Oncology

## 2019-09-16 ENCOUNTER — Ambulatory Visit: Payer: Medicare Other | Admitting: Radiation Oncology

## 2019-09-16 NOTE — Progress Notes (Signed)
  adiation Oncology         [phone]) [phone] ________________________________  Name: Heidi Avila MRN: [id number]  Date: 09/17/2019  DOB: June 04, 1945  CC: Center, [facility]  Berline Lopes, Corinna Lines, MD  HDR BRACHYTHERAPY NOTE  DIAGNOSIS: Stage IIIC1grade 1 endometrioid endometrial adenocarcinoma   Simple treatment device note: Patient had construction of her custom vaginal cylinder. She will be treated with a 3.0 cm diameter segmented cylinder. This conforms to her anatomy without undue discomfort.  Vaginal brachytherapy procedure node: The patient was brought to the [place] suite. Identity was confirmed. All relevant records and images related to the planned course of therapy were reviewed. The patient freely provided informed written consent to proceed with treatment after reviewing the details related to the planned course of therapy. The consent form was witnessed and verified by the simulation staff. Then, the patient was set-up in a stable reproducible supine position for radiation therapy. Pelvic exam revealed the vaginal cuff to be intact . The patient's custom vaginal cylinder was placed in the proximal vagina. This was affixed to the CT/MR stabilization plate to prevent slippage. Patient tolerated the placement well.  Verification simulation note:  A fiducial marker was placed within the vaginal cylinder. An AP  film was then obtained through the pelvis area. This documented accurate position of the vaginal cylinder for treatment.  HDR BRACHYTHERAPY TREATMENT  The remote afterloading device was affixed to the vaginal cylinder by catheter. Patient then proceeded to undergo her second high-dose-rate treatment directed at the proximal vagina. The patient was prescribed a dose of 6.0 gray to be delivered to the mucosal surface. Treatment length was 3.0 cm. Patient was treated with 1 channel using 7 dwell positions. Treatment time was 282.3 seconds. Iridium 192 was the high-dose-rate source for  treatment. The patient tolerated the treatment well. After completion of her therapy, a radiation survey was performed documenting return of the iridium source into the [employer] safe.   PLAN: Patient will return next week for her third high-dose-rate treatment. ________________________________    [name], PhD, MD  This document serves as a record of services personally performed by Gery Pray, MD. It was created on his behalf by Clerance Lav, a trained medical scribe. The creation of this record is based on the scribe's personal observations and the provider's statements to them. This document has been checked and approved by the attending provider.

## 2019-09-17 ENCOUNTER — Ambulatory Visit
Admission: RE | Admit: 2019-09-17 | Discharge: 2019-09-17 | Disposition: A | Discharge status: Home / Self Care | Payer: Medicare Other | Source: Ambulatory Visit | Attending: Radiation Oncology | Admitting: Radiation Oncology

## 2019-09-17 ENCOUNTER — Other Ambulatory Visit: Payer: Self-pay

## 2019-09-17 DIAGNOSIS — C541 Malignant neoplasm of endometrium: Secondary | ICD-10-CM | POA: Insufficient documentation

## 2019-09-21 ENCOUNTER — Other Ambulatory Visit: Payer: Self-pay

## 2019-09-21 ENCOUNTER — Encounter: Payer: Self-pay | Admitting: Hematology and Oncology

## 2019-09-21 ENCOUNTER — Inpatient Hospital Stay (HOSPITAL_BASED_OUTPATIENT_CLINIC_OR_DEPARTMENT_OTHER): Payer: Medicare Other | Admitting: Hematology and Oncology

## 2019-09-21 ENCOUNTER — Inpatient Hospital Stay: Payer: Medicare Other

## 2019-09-21 ENCOUNTER — Inpatient Hospital Stay: Payer: Medicare Other | Attending: Hematology and Oncology

## 2019-09-21 ENCOUNTER — Other Ambulatory Visit: Payer: Self-pay | Admitting: Hematology and Oncology

## 2019-09-21 VITALS — HR 89

## 2019-09-21 DIAGNOSIS — C541 Malignant neoplasm of endometrium: Secondary | ICD-10-CM

## 2019-09-21 DIAGNOSIS — N189 Chronic kidney disease, unspecified: Secondary | ICD-10-CM

## 2019-09-21 DIAGNOSIS — G629 Polyneuropathy, unspecified: Secondary | ICD-10-CM

## 2019-09-21 DIAGNOSIS — Z5111 Encounter for antineoplastic chemotherapy: Secondary | ICD-10-CM | POA: Insufficient documentation

## 2019-09-21 DIAGNOSIS — Z79899 Other long term (current) drug therapy: Secondary | ICD-10-CM | POA: Diagnosis not present

## 2019-09-21 DIAGNOSIS — N179 Acute kidney failure, unspecified: Secondary | ICD-10-CM | POA: Diagnosis not present

## 2019-09-21 LAB — CBC WITH DIFFERENTIAL (CANCER CENTER ONLY)
Abs Immature Granulocytes: 0.07 10*3/uL (ref 0.00–0.07)
Basophils Absolute: 0 10*3/uL (ref 0.0–0.1)
Basophils Relative: 0 %
Eosinophils Absolute: 0 10*3/uL (ref 0.0–0.5)
Eosinophils Relative: 0 %
HCT: 33.1 % — ABNORMAL LOW (ref 36.0–46.0)
Hemoglobin: 10.9 g/dL — ABNORMAL LOW (ref 12.0–15.0)
Immature Granulocytes: 1 %
Lymphocytes Relative: 9 %
Lymphs Abs: 0.5 10*3/uL — ABNORMAL LOW (ref 0.7–4.0)
MCH: 30.2 pg (ref 26.0–34.0)
MCHC: 32.9 g/dL (ref 30.0–36.0)
MCV: 91.7 fL (ref 80.0–100.0)
Monocytes Absolute: 0.2 10*3/uL (ref 0.1–1.0)
Monocytes Relative: 3 %
Neutro Abs: 5 10*3/uL (ref 1.7–7.7)
Neutrophils Relative %: 87 %
Platelet Count: 493 10*3/uL — ABNORMAL HIGH (ref 150–400)
RBC: 3.61 MIL/uL — ABNORMAL LOW (ref 3.87–5.11)
RDW: 16.7 % — ABNORMAL HIGH (ref 11.5–15.5)
WBC Count: 5.7 10*3/uL (ref 4.0–10.5)
nRBC: 0 % (ref 0.0–0.2)

## 2019-09-21 LAB — CMP (CANCER CENTER ONLY)
ALT: 20 U/L (ref 0–44)
AST: 14 U/L — ABNORMAL LOW (ref 15–41)
Albumin: 3.8 g/dL (ref 3.5–5.0)
Alkaline Phosphatase: 49 U/L (ref 38–126)
Anion gap: 14 (ref 5–15)
BUN: 17 mg/dL (ref 8–23)
CO2: 22 mmol/L (ref 22–32)
Calcium: 9.3 mg/dL (ref 8.9–10.3)
Chloride: 101 mmol/L (ref 98–111)
Creatinine: 1.42 mg/dL — ABNORMAL HIGH (ref 0.44–1.00)
GFR, Est AFR Am: 42 mL/min — ABNORMAL LOW (ref >60)
GFR, Estimated: 37 mL/min — ABNORMAL LOW (ref >60)
Glucose, Bld: 203 mg/dL — ABNORMAL HIGH (ref 70–99)
Potassium: 4 mmol/L (ref 3.5–5.1)
Sodium: 137 mmol/L (ref 135–145)
Total Bilirubin: 0.4 mg/dL (ref 0.3–1.2)
Total Protein: 6.9 g/dL (ref 6.5–8.1)

## 2019-09-21 MED ORDER — SODIUM CHLORIDE 0.9% FLUSH
10.0000 mL | Freq: Once | INTRAVENOUS | Status: AC
  Administered 2019-09-21: 10 mL
  Filled 2019-09-21: qty 10

## 2019-09-21 MED ORDER — DIPHENHYDRAMINE HCL 50 MG/ML IJ SOLN
INTRAMUSCULAR | Status: AC
  Filled 2019-09-21: qty 1

## 2019-09-21 MED ORDER — PALONOSETRON HCL INJECTION 0.25 MG/5ML
INTRAVENOUS | Status: AC
  Filled 2019-09-21: qty 5

## 2019-09-21 MED ORDER — FAMOTIDINE IN NACL 20-0.9 MG/50ML-% IV SOLN
20.0000 mg | Freq: Once | INTRAVENOUS | Status: AC
  Administered 2019-09-21: 20 mg via INTRAVENOUS

## 2019-09-21 MED ORDER — PALONOSETRON HCL INJECTION 0.25 MG/5ML
0.2500 mg | Freq: Once | INTRAVENOUS | Status: AC
  Administered 2019-09-21: 0.25 mg via INTRAVENOUS

## 2019-09-21 MED ORDER — HEPARIN SOD (PORK) LOCK FLUSH 100 UNIT/ML IV SOLN
500.0000 [IU] | Freq: Once | INTRAVENOUS | Status: AC | PRN
  Administered 2019-09-21: 500 [IU]
  Filled 2019-09-21: qty 5

## 2019-09-21 MED ORDER — SODIUM CHLORIDE 0.9 % IV SOLN
150.0000 mg | Freq: Once | INTRAVENOUS | Status: AC
  Administered 2019-09-21: 150 mg via INTRAVENOUS
  Filled 2019-09-21: qty 150

## 2019-09-21 MED ORDER — SODIUM CHLORIDE 0.9 % IV SOLN
52.5000 mg/m2 | Freq: Once | INTRAVENOUS | Status: AC
  Administered 2019-09-21: 120 mg via INTRAVENOUS
  Filled 2019-09-21: qty 20

## 2019-09-21 MED ORDER — SODIUM CHLORIDE 0.9% FLUSH
10.0000 mL | INTRAVENOUS | Status: DC | PRN
  Administered 2019-09-21: 10 mL
  Filled 2019-09-21: qty 10

## 2019-09-21 MED ORDER — SODIUM CHLORIDE 0.9 % IV SOLN
525.6000 mg | Freq: Once | INTRAVENOUS | Status: AC
  Administered 2019-09-21: 530 mg via INTRAVENOUS
  Filled 2019-09-21: qty 53

## 2019-09-21 MED ORDER — SODIUM CHLORIDE 0.9 % IV SOLN
10.0000 mg | Freq: Once | INTRAVENOUS | Status: AC
  Administered 2019-09-21: 10 mg via INTRAVENOUS
  Filled 2019-09-21: qty 10

## 2019-09-21 MED ORDER — SODIUM CHLORIDE 0.9 % IV SOLN
Freq: Once | INTRAVENOUS | Status: AC
  Filled 2019-09-21: qty 250

## 2019-09-21 MED ORDER — DIPHENHYDRAMINE HCL 50 MG/ML IJ SOLN
50.0000 mg | Freq: Once | INTRAMUSCULAR | Status: AC
  Administered 2019-09-21: 50 mg via INTRAVENOUS

## 2019-09-21 MED ORDER — FAMOTIDINE IN NACL 20-0.9 MG/50ML-% IV SOLN
INTRAVENOUS | Status: AC
  Filled 2019-09-21: qty 50

## 2019-09-21 NOTE — Patient Instructions (Signed)

## 2019-09-21 NOTE — Assessment & Plan Note (Signed)
She tolerated cycle 2 of treatment better with significant dose adjustment We will proceed with similar dose adjustment as before She tolerated radiation therapy well

## 2019-09-21 NOTE — Patient Instructions (Signed)
[facility] Discharge Instructions for Patients Receiving Chemotherapy  Today you received the following chemotherapy agents: Paclitaxel and Carboplatin  To help prevent nausea and vomiting after your treatment, we encourage you to take your nausea medication as prescribed.    If you develop nausea and vomiting that is not controlled by your nausea medication, call the clinic.   BELOW ARE SYMPTOMS THAT SHOULD BE REPORTED IMMEDIATELY:  *FEVER GREATER THAN 100.5 F  *CHILLS WITH OR WITHOUT FEVER  NAUSEA AND VOMITING THAT IS NOT CONTROLLED WITH YOUR NAUSEA MEDICATION  *UNUSUAL SHORTNESS OF BREATH  *UNUSUAL BRUISING OR BLEEDING  TENDERNESS IN MOUTH AND THROAT WITH OR WITHOUT PRESENCE OF ULCERS  *URINARY PROBLEMS  *BOWEL PROBLEMS  UNUSUAL RASH Items with * indicate a potential emergency and should be followed up as soon as possible.  Feel free to call the clinic should you have any questions or concerns. The clinic phone number is [phone].  Please show the CHEMO ALERT CARD at check-in to the Emergency Department and triage nurse.   

## 2019-09-21 NOTE — Assessment & Plan Note (Signed)
She complain of severe worsening neuropathy while on treatment.  With recent drastic dose adjustment of Taxol, her neuropathy is better We will continue similar dose adjustment

## 2019-09-22 ENCOUNTER — Ambulatory Visit: Payer: Medicare Other | Admitting: Radiation Oncology

## 2019-09-22 ENCOUNTER — Telehealth: Payer: Self-pay | Admitting: Hematology and Oncology

## 2019-09-22 NOTE — Telephone Encounter (Signed)
Scheduled per 6/7 sch message. Pt aware of appts.

## 2019-09-22 NOTE — Assessment & Plan Note (Signed)
She had recent nice recovery of renal function We will recheck baseline creatinine again before treatment and we will adjust the dose of carboplatin accordingly

## 2019-09-22 NOTE — Progress Notes (Signed)
[place] OFFICE PROGRESS NOTE  Patient Care Team: [facility], [facility] as PCP - General Awanda Mink, Craige Cotta, RN as Oncology Nurse Navigator (Oncology) Gery Pray, MD as Consulting Physician (Radiation Oncology)  ASSESSMENT & PLAN:  Endometrial cancer [facility]) She tolerated cycle 2 of treatment better with significant dose adjustment We will proceed with similar dose adjustment as before She tolerated radiation therapy well  Peripheral polyneuropathy She complain of severe worsening neuropathy while on treatment.  With recent drastic dose adjustment of Taxol, her neuropathy is better We will continue similar dose adjustment  Acute on chronic renal failure ([place]) She had recent nice recovery of renal function We will recheck baseline creatinine again before treatment and we will adjust the dose of carboplatin accordingly   No orders of the defined types were placed in this encounter.   All questions were answered. The patient knows to call the clinic with any problems, questions or concerns. The total time spent in the appointment was 20 minutes encounter with patients including review of chart and various tests results, discussions about plan of care and coordination of care plan   Heath Lark, MD 09/22/2019 8:57 AM  INTERVAL HISTORY: Please see below for problem oriented charting. She returns for chemotherapy and follow-up She tolerated last cycle of treatment better with dose adjustment She tolerated radiation well Her neuropathy and bone pain is stable She continues to have intermittent constipation but she takes laxatives No recent nausea  SUMMARY OF ONCOLOGIC HISTORY: Oncology History Overview Note  Endometrioid, MSI High, MLH1 promoter hypermethylation present   Endometrial cancer ([place])  05/18/2019 Initial Biopsy   EMB: Complex atypical hyperplasia, cannot rule out microscopic focus of FIGO grade 1 carcinoma   05/18/2019 Initial Diagnosis   Presented in  February 2021 with postmenopausal bleeding.  She was started on Provera and underwent endometrial biopsy showing complex atypical hyperplasia with a possible focus suspicious for grade 1 endometrial cancer.   05/21/2019 Imaging   Pelvic ultrasound at [facility]: Anteverted uterus measuring 8.2 cm in greatest dimension.  Endometrial lining measures 1.73 cm and is thickened.  Left ovary not visualized, right ovary normal in appearance.   06/30/2019 Surgery   Robotic-assisted laparoscopic total hysterectomy with bilateral salpingoophorectomy, bilateral pelvic LND  Frozen section showed endometrial cancer with more than 50% myometrial invasion.  Patient did not map to a sentinel lymph node bilaterally.   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  Tumor measures 3.5 cm and invades focally into the serosa.  No involvement of the cervix or bilateral adnexa.  1 of 20 lymph nodes positive for metastatic carcinoma, focus measures 1 cm without evidence of extracapsular extension.  FINAL MICROSCOPIC DIAGNOSIS: A. UTERUS, CERVIX, BILATERAL FALLOPIAN TUBES AND OVARIES, HYSTERECTOMY WITH SALPINGECTOMY: - Endometrioid adenocarcinoma, 3.5 cm, FIGO grade 1 - Carcinoma invades through the entire myometrium and focally into the serosal surface - Benign unremarkable cervix - Benign unremarkable bilateral fallopian tubes and left ovary - Benign serous inclusion cyst of right ovary - See oncology table B. LYMPH NODE, RIGHT PELVIC, BIOPSY: - Metastatic carcinoma to one of twelve lymph nodes (1/12) - Focus of metastatic carcinoma measures 1.0 cm, without evidence of extracapsular extension C. LYMPH NODE, LEFT PELVIC, BIOPSY: - Eight benign lymph nodes (0/8) ONCOLOGY TABLE: UTERUS, CARCINOMA OR CARCINOSARCOMA Procedure: Total hysterectomy and bilateral salpingo-oophorectomy Histologic type: Endometrioid adenocarcinoma Histologic Grade: FIGO grade 1 Myometrial  invasion: Depth of invasion: 30 mm Myometrial thickness: 30 mm Uterine Serosa  Involvement: Present, focal Cervical stromal involvement: Not identified Extent of involvement of other organs: Not identified Lymphovascular invasion: Not identified Regional Lymph Nodes: Examined: 0 Sentinel 20 Non-sentinel 20 Total Lymph nodes with metastasis: 1 Isolated tumor cells (<0.2 mm): 0 Micrometastasis: (>0.2 mm and < 2.0 mm): 0 Macrometastasis: (>2.0 mm): 1 Extracapsular extension: Not identified Representative Tumor Block: A6 MMR / MSI testing: Will be ordered Pathologic Stage Classification (pTNM, AJCC 8th edition): pT3, pN1a Comments: None Mismatch Repair Protein (IHC) SUMMARY INTERPRETATION: ABNORMAL 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 The loss of expression may be secondary to promoter hyper-methylation, gene mutation or other genetic event. BRAF mutation testing and/or MLH1 methylation testing is indicated. The presence of a BRAF mutation and/or MLH1 hypermethylation is indicative of a sporadic-type tumor. The absence of either BRAF mutation and/or presence of normal methylation indicate the possible presence of a hereditary germline mutation (e.g.Lynch syndrome) and referral to genetic counseling is warranted. It is recommended that the loss of protein expression be correlated with molecular based MSI testing   06/30/2019 Cancer Staging   Staging form: Corpus Uteri - Carcinoma and Carcinosarcoma, AJCC 8th Edition - Clinical stage from 06/30/2019: FIGO Stage IIIC1 (cT3, cN1a, cM0) - Signed by Lafonda Mosses, MD on 07/09/2019   07/16/2019 Imaging   1. No evidence of metastatic disease in the chest, abdomen or pelvis. 2. Status post hysterectomy. Small volume simple ascites. Small amount of ill-defined fluid in the pelvic sidewalls, compatible with recent surgery. 3. Small umbilical hernia containing fat and trace ascitic fluid. 4. Chronic findings include:  Aortic Atherosclerosis (ICD10-I70.0). Small hiatal hernia. Mild sigmoid diverticulosis   07/29/2019 Procedure   Ultrasound and fluoroscopically guided right internal jugular single lumen power port catheter insertion. Tip in the SVC/RA junction. Catheter ready for use.   08/03/2019 -  Chemotherapy   The patient had carboplatin and taxol for chemotherapy treatment.       REVIEW OF SYSTEMS:   Constitutional: Denies fevers, chills or abnormal weight loss Eyes: Denies blurriness of vision Ears, nose, mouth, throat, and face: Denies mucositis or sore throat Respiratory: Denies cough, dyspnea or wheezes Cardiovascular: Denies palpitation, chest discomfort or lower extremity swelling Skin: Denies abnormal skin rashes Lymphatics: Denies new lymphadenopathy or easy bruising Neurological:Denies numbness, tingling or new weaknesses Behavioral/Psych: Mood is stable, no new changes  All other systems were reviewed with the patient and are negative.  I have reviewed the past medical history, past surgical history, social history and family history with the patient and they are unchanged from previous note.  ALLERGIES:  is allergic to amoxicillin and latex.  MEDICATIONS:  Current Outpatient Medications  Medication Sig Dispense Refill  . AMITIZA 24 MCG capsule Take 48 mcg by mouth every evening.     . B Complex Vitamins (B COMPLEX PO) Take 1 Dose by mouth daily. Liquid b complex    . benazepril-hydrochlorthiazide (LOTENSIN HCT) 20-25 MG tablet Take 1 tablet by mouth every evening.     Marland Kitchen dexamethasone (DECADRON) 4 MG tablet Take 2 tabs at the night before and 2 tab the morning of chemotherapy, every 3 weeks, by mouth x 6 cycles total 24 tablet 0  . diphenhydrAMINE (BENADRYL) 25 MG tablet Take 50 mg by mouth daily as needed for allergies.    Marland Kitchen gabapentin (NEURONTIN) 600 MG tablet Take 600-1,200 mg by mouth See admin instructions. Take 1200 mg in the morning, 600 mg in the afternoon, and 600 mg at  night    .  lidocaine-prilocaine (EMLA) cream Apply to affected area once 30 g 3  . Melatonin 5 MG CAPS Take 5 mg by mouth at bedtime.    . meloxicam (MOBIC) 15 MG tablet Take 15 mg by mouth daily in the afternoon.     . methocarbamol (ROBAXIN) 500 MG tablet Take 1,000 mg by mouth 2 (two) times daily as needed.    Marland Kitchen ofloxacin (OCUFLOX) 0.3 % ophthalmic solution Place 1 drop into the right eye 4 (four) times daily.     Marland Kitchen omeprazole (PRILOSEC OTC) 20 MG tablet Take 20 mg by mouth every evening.    . ondansetron (ZOFRAN) 8 MG tablet Take 1 tablet (8 mg total) by mouth every 8 (eight) hours as needed. Start on the third day after chemotherapy. 60 tablet 3  . oxyCODONE (OXY IR/ROXICODONE) 5 MG immediate release tablet Take 1 tablet (5 mg total) by mouth every 4 (four) hours as needed for severe pain. Do not take with tramadol and do not take and drive (Patient not taking: Reported on 08/24/2019) 10 tablet 0  . Oxymetazoline HCl (VICKS SINEX 12 HOUR NA) Place 1 spray into the nose 2 (two) times daily as needed (congestion).    . prochlorperazine (COMPAZINE) 10 MG tablet Take 1 tablet (10 mg total) by mouth every 6 (six) hours as needed (Nausea or vomiting). 30 tablet 1  . tiZANidine (ZANAFLEX) 4 MG tablet Take 4 mg by mouth at bedtime.    . topiramate (TOPAMAX) 25 MG tablet Take 75 mg by mouth at bedtime.    . traMADol (ULTRAM) 50 MG tablet Take 1 tablet (50 mg total) by mouth every 6 (six) hours as needed for severe pain. For AFTER surgery, do not take and drive (Patient not taking: Reported on 08/24/2019) 10 tablet 0   No current facility-administered medications for this visit.    PHYSICAL EXAMINATION: ECOG PERFORMANCE STATUS: 2 - Symptomatic, <50% confined to bed  Vitals:   09/21/19 1044  BP: 131/81  Pulse: (!) 107  Resp: 18  Temp: 97.9 F (36.6 C)  SpO2: 100%   Filed Weights   09/21/19 1044  Weight: 243 lb 3.2 oz (110.3 kg)    GENERAL:alert, no distress and comfortable SKIN: skin  color, texture, turgor are normal, no rashes or significant lesions EYES: normal, Conjunctiva are pink and non-injected, sclera clear OROPHARYNX:no exudate, no erythema and lips, buccal mucosa, and tongue normal  NECK: supple, thyroid normal size, non-tender, without nodularity LYMPH:  no palpable lymphadenopathy in the cervical, axillary or inguinal LUNGS: clear to auscultation and percussion with normal breathing effort HEART: regular rate & rhythm and no murmurs and no lower extremity edema ABDOMEN:abdomen soft, non-tender and normal bowel sounds Musculoskeletal:no cyanosis of digits and no clubbing  NEURO: alert & oriented x 3 with fluent speech, no focal motor/sensory deficits  LABORATORY DATA:  I have reviewed the data as listed    Component Value Date/Time   NA 137 09/21/2019 1010   K 4.0 09/21/2019 1010   CL 101 09/21/2019 1010   CO2 22 09/21/2019 1010   GLUCOSE 203 (H) 09/21/2019 1010   BUN 17 09/21/2019 1010   CREATININE 1.42 (H) 09/21/2019 1010   CALCIUM 9.3 09/21/2019 1010   PROT 6.9 09/21/2019 1010   ALBUMIN 3.8 09/21/2019 1010   AST 14 (L) 09/21/2019 1010   ALT 20 09/21/2019 1010   ALKPHOS 49 09/21/2019 1010   BILITOT 0.4 09/21/2019 1010   GFRNONAA 37 (L) 09/21/2019 1010   GFRAA 42 (L) 09/21/2019  1010    No results found for: SPEP, UPEP  Lab Results  Component Value Date   WBC 5.7 09/21/2019   NEUTROABS 5.0 09/21/2019   HGB 10.9 (L) 09/21/2019   HCT 33.1 (L) 09/21/2019   MCV 91.7 09/21/2019   PLT 493 (H) 09/21/2019      Chemistry      Component Value Date/Time   NA 137 09/21/2019 1010   K 4.0 09/21/2019 1010   CL 101 09/21/2019 1010   CO2 22 09/21/2019 1010   BUN 17 09/21/2019 1010   CREATININE 1.42 (H) 09/21/2019 1010      Component Value Date/Time   CALCIUM 9.3 09/21/2019 1010   ALKPHOS 49 09/21/2019 1010   AST 14 (L) 09/21/2019 1010   ALT 20 09/21/2019 1010   BILITOT 0.4 09/21/2019 1010

## 2019-09-23 ENCOUNTER — Telehealth: Payer: Self-pay | Admitting: *Deleted

## 2019-09-23 ENCOUNTER — Ambulatory Visit: Payer: Medicare Other | Admitting: Radiation Oncology

## 2019-09-23 NOTE — Telephone Encounter (Signed)
Called patient to remind of HDR Tx. for 09-24-19 @ 11 am, spoke with patient and he is aware of this tx.

## 2019-09-23 NOTE — Progress Notes (Signed)
°  adiation Oncology         [phone]) [phone] ________________________________  Name: Heidi Avila MRN: [id number]  Date: 09/24/2019  DOB: [birth year]  CC: [facility], [facility]  Berline Lopes, Corinna Lines, MD  HDR BRACHYTHERAPY NOTE  DIAGNOSIS: Stage IIIC1grade 1 endometrioid endometrial adenocarcinoma   Simple treatment device note: Patient had construction of her custom vaginal cylinder. She will be treated with a 3.0 cm diameter segmented cylinder. This conforms to her anatomy without undue discomfort.  Vaginal brachytherapy procedure node: The patient was brought to the [place] suite. Identity was confirmed. All relevant records and images related to the planned course of therapy were reviewed. The patient freely provided informed written consent to proceed with treatment after reviewing the details related to the planned course of therapy. The consent form was witnessed and verified by the simulation staff. Then, the patient was set-up in a stable reproducible supine position for radiation therapy. Pelvic exam revealed the vaginal cuff to be intact . The patient's custom vaginal cylinder was placed in the proximal vagina. This was affixed to the CT/MR stabilization plate to prevent slippage. Patient tolerated the placement well.  Verification simulation note:  A fiducial marker was placed within the vaginal cylinder. An AP  film was then obtained through the pelvis area. This documented accurate position of the vaginal cylinder for treatment.  HDR BRACHYTHERAPY TREATMENT  The remote afterloading device was affixed to the vaginal cylinder by catheter. Patient then proceeded to undergo her third high-dose-rate treatment directed at the proximal vagina. The patient was prescribed a dose of 6.0 gray to be delivered to the mucosal surface. Treatment length was 3.0 cm. Patient was treated with 1 channel using 7 dwell positions. Treatment time was 301.7 seconds. Iridium 192 was the high-dose-rate source for  treatment. The patient tolerated the treatment well. After completion of her therapy, a radiation survey was performed documenting return of the iridium source into the GammaMed safe.   PLAN: Patient will return next week for her fourth high-dose-rate treatment. ________________________________    [name], PhD, MD  This document serves as a record of services personally performed by Gery Pray, MD. It was created on his behalf by Clerance Lav, a trained medical scribe. The creation of this record is based on the scribe's personal observations and the provider's statements to them. This document has been checked and approved by the attending provider.

## 2019-09-24 ENCOUNTER — Ambulatory Visit
Admission: RE | Admit: 2019-09-24 | Discharge: 2019-09-24 | Disposition: A | Discharge status: Home / Self Care | Payer: Medicare Other | Source: Ambulatory Visit | Attending: Radiation Oncology | Admitting: Radiation Oncology

## 2019-09-24 ENCOUNTER — Other Ambulatory Visit: Payer: Self-pay

## 2019-09-24 DIAGNOSIS — C541 Malignant neoplasm of endometrium: Secondary | ICD-10-CM | POA: Diagnosis not present

## 2019-09-29 ENCOUNTER — Telehealth: Payer: Self-pay | Admitting: *Deleted

## 2019-09-29 NOTE — Progress Notes (Signed)
  adiation Oncology         [phone]) [phone] ________________________________  Name: Heidi Avila MRN: [id number]  Date: 09/30/2019  DOB: 25-May-1945  CC: [facility], [facility]  Berline Lopes, Corinna Lines, MD  HDR BRACHYTHERAPY NOTE  DIAGNOSIS: Stage IIIC1grade 1 endometrioid endometrial adenocarcinoma   Simple treatment device note: Patient had construction of her custom vaginal cylinder. She will be treated with a 3.0 cm diameter segmented cylinder. This conforms to her anatomy without undue discomfort.  Vaginal brachytherapy procedure node: The patient was brought to the [place] suite. Identity was confirmed. All relevant records and images related to the planned course of therapy were reviewed. The patient freely provided informed written consent to proceed with treatment after reviewing the details related to the planned course of therapy. The consent form was witnessed and verified by the simulation staff. Then, the patient was set-up in a stable reproducible supine position for radiation therapy. Pelvic exam revealed the vaginal cuff to be intact . The patient's custom vaginal cylinder was placed in the proximal vagina. This was affixed to the CT/MR stabilization plate to prevent slippage. Patient tolerated the placement well.  Verification simulation note:  A fiducial marker was placed within the vaginal cylinder. An AP  film was then obtained through the pelvis area. This documented accurate position of the vaginal cylinder for treatment.  HDR BRACHYTHERAPY TREATMENT  The remote afterloading device was affixed to the vaginal cylinder by catheter. Patient then proceeded to undergo her fourth high-dose-rate treatment directed at the proximal vagina. The patient was prescribed a dose of 6.0 gray to be delivered to the mucosal surface. Treatment length was 3.0 cm. Patient was treated with 1 channel using 7 dwell positions. Treatment time was 318.9 seconds. Iridium 192 was the high-dose-rate source  for treatment. The patient tolerated the treatment well. After completion of her therapy, a radiation survey was performed documenting return of the iridium source into the GammaMed safe.   PLAN: Patient will return on 10/14/2019 for her fifth and final high-dose-rate treatment. ________________________________    [name], PhD, MD  This document serves as a record of services personally performed by Gery Pray, MD. It was created on his behalf by Clerance Lav, a trained medical scribe. The creation of this record is based on the scribe's personal observations and the provider's statements to them. This document has been checked and approved by the attending provider.

## 2019-09-29 NOTE — Telephone Encounter (Signed)
Called patient to remind of HDR Tx. for 09-30-19 @ 11 am, spoke with patient and she is aware of this tx.

## 2019-09-30 ENCOUNTER — Ambulatory Visit
Admission: RE | Admit: 2019-09-30 | Discharge: 2019-09-30 | Disposition: A | Discharge status: Home / Self Care | Payer: Medicare Other | Source: Ambulatory Visit | Attending: Radiation Oncology | Admitting: Radiation Oncology

## 2019-09-30 DIAGNOSIS — C541 Malignant neoplasm of endometrium: Secondary | ICD-10-CM | POA: Diagnosis not present

## 2019-10-12 ENCOUNTER — Inpatient Hospital Stay: Payer: Medicare Other

## 2019-10-12 ENCOUNTER — Encounter: Payer: Self-pay | Admitting: Hematology and Oncology

## 2019-10-12 ENCOUNTER — Other Ambulatory Visit: Payer: Self-pay | Admitting: Hematology and Oncology

## 2019-10-12 ENCOUNTER — Other Ambulatory Visit: Payer: Self-pay

## 2019-10-12 ENCOUNTER — Inpatient Hospital Stay (HOSPITAL_BASED_OUTPATIENT_CLINIC_OR_DEPARTMENT_OTHER): Payer: Medicare Other | Admitting: Hematology and Oncology

## 2019-10-12 DIAGNOSIS — H1031 Unspecified acute conjunctivitis, right eye: Secondary | ICD-10-CM

## 2019-10-12 DIAGNOSIS — N179 Acute kidney failure, unspecified: Secondary | ICD-10-CM

## 2019-10-12 DIAGNOSIS — C541 Malignant neoplasm of endometrium: Secondary | ICD-10-CM

## 2019-10-12 DIAGNOSIS — K5909 Other constipation: Secondary | ICD-10-CM

## 2019-10-12 DIAGNOSIS — G629 Polyneuropathy, unspecified: Secondary | ICD-10-CM | POA: Diagnosis not present

## 2019-10-12 DIAGNOSIS — N189 Chronic kidney disease, unspecified: Secondary | ICD-10-CM

## 2019-10-12 DIAGNOSIS — Z5111 Encounter for antineoplastic chemotherapy: Secondary | ICD-10-CM | POA: Diagnosis not present

## 2019-10-12 LAB — CBC WITH DIFFERENTIAL (CANCER CENTER ONLY)
Abs Immature Granulocytes: 0.08 10*3/uL — ABNORMAL HIGH (ref 0.00–0.07)
Basophils Absolute: 0 10*3/uL (ref 0.0–0.1)
Basophils Relative: 0 %
Eosinophils Absolute: 0 10*3/uL (ref 0.0–0.5)
Eosinophils Relative: 0 %
HCT: 29.2 % — ABNORMAL LOW (ref 36.0–46.0)
Hemoglobin: 9.8 g/dL — ABNORMAL LOW (ref 12.0–15.0)
Immature Granulocytes: 2 %
Lymphocytes Relative: 7 %
Lymphs Abs: 0.4 10*3/uL — ABNORMAL LOW (ref 0.7–4.0)
MCH: 31 pg (ref 26.0–34.0)
MCHC: 33.6 g/dL (ref 30.0–36.0)
MCV: 92.4 fL (ref 80.0–100.0)
Monocytes Absolute: 0.2 10*3/uL (ref 0.1–1.0)
Monocytes Relative: 3 %
Neutro Abs: 4.8 10*3/uL (ref 1.7–7.7)
Neutrophils Relative %: 88 %
Platelet Count: 260 10*3/uL (ref 150–400)
RBC: 3.16 MIL/uL — ABNORMAL LOW (ref 3.87–5.11)
RDW: 17.7 % — ABNORMAL HIGH (ref 11.5–15.5)
WBC Count: 5.5 10*3/uL (ref 4.0–10.5)
nRBC: 0 % (ref 0.0–0.2)

## 2019-10-12 LAB — CMP (CANCER CENTER ONLY)
ALT: 21 U/L (ref 0–44)
AST: 15 U/L (ref 15–41)
Albumin: 3.7 g/dL (ref 3.5–5.0)
Alkaline Phosphatase: 44 U/L (ref 38–126)
Anion gap: 11 (ref 5–15)
BUN: 15 mg/dL (ref 8–23)
CO2: 26 mmol/L (ref 22–32)
Calcium: 9.6 mg/dL (ref 8.9–10.3)
Chloride: 102 mmol/L (ref 98–111)
Creatinine: 1.54 mg/dL — ABNORMAL HIGH (ref 0.44–1.00)
GFR, Est AFR Am: 38 mL/min — ABNORMAL LOW (ref >60)
GFR, Estimated: 33 mL/min — ABNORMAL LOW (ref >60)
Glucose, Bld: 169 mg/dL — ABNORMAL HIGH (ref 70–99)
Potassium: 3.8 mmol/L (ref 3.5–5.1)
Sodium: 139 mmol/L (ref 135–145)
Total Bilirubin: 0.4 mg/dL (ref 0.3–1.2)
Total Protein: 6.8 g/dL (ref 6.5–8.1)

## 2019-10-12 MED ORDER — DIPHENHYDRAMINE HCL 50 MG/ML IJ SOLN
50.0000 mg | Freq: Once | INTRAMUSCULAR | Status: AC
  Administered 2019-10-12: 50 mg via INTRAVENOUS

## 2019-10-12 MED ORDER — SODIUM CHLORIDE 0.9% FLUSH
10.0000 mL | Freq: Once | INTRAVENOUS | Status: AC
  Administered 2019-10-12: 10 mL
  Filled 2019-10-12: qty 10

## 2019-10-12 MED ORDER — PALONOSETRON HCL INJECTION 0.25 MG/5ML
INTRAVENOUS | Status: AC
  Filled 2019-10-12: qty 5

## 2019-10-12 MED ORDER — SODIUM CHLORIDE 0.9 % IV SOLN
150.0000 mg | Freq: Once | INTRAVENOUS | Status: AC
  Administered 2019-10-12: 150 mg via INTRAVENOUS
  Filled 2019-10-12: qty 150

## 2019-10-12 MED ORDER — FAMOTIDINE IN NACL 20-0.9 MG/50ML-% IV SOLN
INTRAVENOUS | Status: AC
  Filled 2019-10-12: qty 50

## 2019-10-12 MED ORDER — FAMOTIDINE IN NACL 20-0.9 MG/50ML-% IV SOLN
20.0000 mg | Freq: Once | INTRAVENOUS | Status: AC
  Administered 2019-10-12: 20 mg via INTRAVENOUS

## 2019-10-12 MED ORDER — SODIUM CHLORIDE 0.9 % IV SOLN
120.0000 mg | Freq: Once | INTRAVENOUS | Status: AC
  Administered 2019-10-12: 120 mg via INTRAVENOUS
  Filled 2019-10-12: qty 20

## 2019-10-12 MED ORDER — SODIUM CHLORIDE 0.9% FLUSH
10.0000 mL | INTRAVENOUS | Status: DC | PRN
  Administered 2019-10-12: 10 mL
  Filled 2019-10-12: qty 10

## 2019-10-12 MED ORDER — HEPARIN SOD (PORK) LOCK FLUSH 100 UNIT/ML IV SOLN
500.0000 [IU] | Freq: Once | INTRAVENOUS | Status: AC | PRN
  Administered 2019-10-12: 500 [IU]
  Filled 2019-10-12: qty 5

## 2019-10-12 MED ORDER — SODIUM CHLORIDE 0.9 % IV SOLN
10.0000 mg | Freq: Once | INTRAVENOUS | Status: AC
  Administered 2019-10-12: 10 mg via INTRAVENOUS
  Filled 2019-10-12: qty 10

## 2019-10-12 MED ORDER — SODIUM CHLORIDE 0.9 % IV SOLN
Freq: Once | INTRAVENOUS | Status: AC
  Filled 2019-10-12: qty 250

## 2019-10-12 MED ORDER — PALONOSETRON HCL INJECTION 0.25 MG/5ML
0.2500 mg | Freq: Once | INTRAVENOUS | Status: AC
  Administered 2019-10-12: 0.25 mg via INTRAVENOUS

## 2019-10-12 MED ORDER — DIPHENHYDRAMINE HCL 50 MG/ML IJ SOLN
INTRAMUSCULAR | Status: AC
  Filled 2019-10-12: qty 1

## 2019-10-12 MED ORDER — SODIUM CHLORIDE 0.9 % IV SOLN
484.8000 mg | Freq: Once | INTRAVENOUS | Status: AC
  Administered 2019-10-12: 480 mg via INTRAVENOUS
  Filled 2019-10-12: qty 48

## 2019-10-12 NOTE — Progress Notes (Signed)
Per Dr. Alvy Bimler, okay for patient to receive dose adjusted treatment today with creatine 1.54.

## 2019-10-12 NOTE — Patient Instructions (Signed)
[facility] Discharge Instructions for Patients Receiving Chemotherapy  Today you received the following chemotherapy agents: Paclitaxel and Carboplatin  To help prevent nausea and vomiting after your treatment, we encourage you to take your nausea medication as prescribed.    If you develop nausea and vomiting that is not controlled by your nausea medication, call the clinic.   BELOW ARE SYMPTOMS THAT SHOULD BE REPORTED IMMEDIATELY:  *FEVER GREATER THAN 100.5 F  *CHILLS WITH OR WITHOUT FEVER  NAUSEA AND VOMITING THAT IS NOT CONTROLLED WITH YOUR NAUSEA MEDICATION  *UNUSUAL SHORTNESS OF BREATH  *UNUSUAL BRUISING OR BLEEDING  TENDERNESS IN MOUTH AND THROAT WITH OR WITHOUT PRESENCE OF ULCERS  *URINARY PROBLEMS  *BOWEL PROBLEMS  UNUSUAL RASH Items with * indicate a potential emergency and should be followed up as soon as possible.  Feel free to call the clinic should you have any questions or concerns. The clinic phone number is [phone].  Please show the CHEMO ALERT CARD at check-in to the Emergency Department and triage nurse.   

## 2019-10-12 NOTE — Assessment & Plan Note (Signed)
She tolerated treatment poorly despite multiple dose adjustment She continues to have severe peripheral neuropathy We discussed the risk and benefits of continuing Taxol versus discontinuation of treatment Ultimately, we made informed decision to proceed with 1 more cycle of combination carboplatin and Taxol She is educated to observe for worsening peripheral neuropathy I plan to adjust the dose of carboplatin given elevated creatinine function and changes in her weight

## 2019-10-12 NOTE — Assessment & Plan Note (Signed)
She complain of severe worsening neuropathy while on treatment.  With recent drastic dose adjustment of Taxol, her neuropathy is still bad  We discussed the risk and benefits of discontinuation of Taxol versus continuing For now, we will continue with treatment with dose adjustment

## 2019-10-12 NOTE — Assessment & Plan Note (Signed)
She denies visual changes Observe for now

## 2019-10-12 NOTE — Progress Notes (Signed)
[place] OFFICE PROGRESS NOTE  Patient Care Team: [facility] [place] as PCP - General Awanda Mink, Craige Cotta, RN as Oncology Nurse Navigator (Oncology) Gery Pray, MD as Consulting Physician (Radiation Oncology)  ASSESSMENT & PLAN:  Endometrial cancer [facility]) She tolerated treatment poorly despite multiple dose adjustment She continues to have severe peripheral neuropathy We discussed the risk and benefits of continuing Taxol versus discontinuation of treatment Ultimately, we made informed decision to proceed with 1 more cycle of combination carboplatin and Taxol She is educated to observe for worsening peripheral neuropathy I plan to adjust the dose of carboplatin given elevated creatinine function and changes in her weight  Acute on chronic renal failure ([place]) She has mild acute on chronic renal failure I plan to proceed with chemotherapy with dose adjustment  Peripheral polyneuropathy She complain of severe worsening neuropathy while on treatment.  With recent drastic dose adjustment of Taxol, her neuropathy is still bad  We discussed the risk and benefits of discontinuation of Taxol versus continuing For now, we will continue with treatment with dose adjustment  Acute conjunctivitis of right eye She denies visual changes Observe for now  Other constipation We discussed the importance of taking laxatives on a regular basis   No orders of the defined types were placed in this encounter.   All questions were answered. The patient knows to call the clinic with any problems, questions or concerns. The total time spent in the appointment was 30 minutes encounter with patients including review of chart and various tests results, discussions about plan of care and coordination of care plan   Heath Lark, MD 10/12/2019 11:28 AM  INTERVAL HISTORY: Please see below for problem oriented charting. She returns for chemotherapy and follow-up She tolerated radiation  well She has mild intermittent chronic constipation No recent nausea vomiting She continues to experience severe peripheral neuropathy but denies significant pain or dropping objects She is noted to have redness in her right eye but her vision is preserved No recent fever or chills  SUMMARY OF ONCOLOGIC HISTORY: Oncology History Overview Note  Endometrioid, MSI High, MLH1 promoter hypermethylation present   Endometrial cancer ([place])  05/18/2019 Initial Biopsy   EMB: Complex atypical hyperplasia, cannot rule out microscopic focus of FIGO grade 1 carcinoma   05/18/2019 Initial Diagnosis   Presented in February 2021 with postmenopausal bleeding.  She was started on Provera and underwent endometrial biopsy showing complex atypical hyperplasia with a possible focus suspicious for grade 1 endometrial cancer.   05/21/2019 Imaging   Pelvic ultrasound at [facility] [place] [facility]: Anteverted uterus measuring 8.2 cm in greatest dimension.  Endometrial lining measures 1.73 cm and is thickened.  Left ovary not visualized, right ovary normal in appearance.   06/30/2019 Surgery   Robotic-assisted laparoscopic total hysterectomy with bilateral salpingoophorectomy, bilateral pelvic LND  Frozen section showed endometrial cancer with more than 50% myometrial invasion.  Patient did not map to a sentinel lymph node bilaterally.   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  Tumor measures 3.5 cm and invades focally into the serosa.  No involvement of the cervix or bilateral adnexa.  1 of 20 lymph nodes positive for metastatic carcinoma, focus measures 1 cm without evidence of extracapsular extension.  FINAL MICROSCOPIC DIAGNOSIS: A. UTERUS, CERVIX, BILATERAL FALLOPIAN TUBES AND OVARIES, HYSTERECTOMY WITH SALPINGECTOMY: - Endometrioid adenocarcinoma, 3.5 cm, FIGO grade 1 - Carcinoma invades through the entire myometrium and focally into the serosal surface - Benign unremarkable  cervix -  Benign unremarkable bilateral fallopian tubes and left ovary - Benign serous inclusion cyst of right ovary - See oncology table B. LYMPH NODE, RIGHT PELVIC, BIOPSY: - Metastatic carcinoma to one of twelve lymph nodes (1/12) - Focus of metastatic carcinoma measures 1.0 cm, without evidence of extracapsular extension C. LYMPH NODE, LEFT PELVIC, BIOPSY: - Eight benign lymph nodes (0/8) ONCOLOGY TABLE: UTERUS, CARCINOMA OR CARCINOSARCOMA Procedure: Total hysterectomy and bilateral salpingo-oophorectomy Histologic type: Endometrioid adenocarcinoma Histologic Grade: FIGO grade 1 Myometrial invasion: Depth of invasion: 30 mm Myometrial thickness: 30 mm Uterine Serosa Involvement: Present, focal Cervical stromal involvement: Not identified Extent of involvement of other organs: Not identified Lymphovascular invasion: Not identified Regional Lymph Nodes: Examined: 0 Sentinel 20 Non-sentinel 20 Total Lymph nodes with metastasis: 1 Isolated tumor cells (<0.2 mm): 0 Micrometastasis: (>0.2 mm and < 2.0 mm): 0 Macrometastasis: (>2.0 mm): 1 Extracapsular extension: Not identified Representative Tumor Block: A6 MMR / MSI testing: Will be ordered Pathologic Stage Classification (pTNM, AJCC 8th edition): pT3, pN1a Comments: None Mismatch Repair Protein (IHC) SUMMARY INTERPRETATION: ABNORMAL 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 The loss of expression may be secondary to promoter hyper-methylation, gene mutation or other genetic event. BRAF mutation testing and/or MLH1 methylation testing is indicated. The presence of a BRAF mutation and/or MLH1 hypermethylation is indicative of a sporadic-type tumor. The absence of either BRAF mutation and/or presence of normal methylation indicate the possible presence of a hereditary germline mutation (e.g.Lynch syndrome) and referral to genetic counseling is warranted. It is recommended that the loss of protein expression be  correlated with molecular based MSI testing   06/30/2019 Cancer Staging   Staging form: Corpus Uteri - Carcinoma and Carcinosarcoma, AJCC 8th Edition - Clinical stage from 06/30/2019: FIGO Stage IIIC1 (cT3, cN1a, cM0) - Signed by Lafonda Mosses, MD on 07/09/2019   07/16/2019 Imaging   1. No evidence of metastatic disease in the chest, abdomen or pelvis. 2. Status post hysterectomy. Small volume simple ascites. Small amount of ill-defined fluid in the pelvic sidewalls, compatible with recent surgery. 3. Small umbilical hernia containing fat and trace ascitic fluid. 4. Chronic findings include: Aortic Atherosclerosis (ICD10-I70.0). Small hiatal hernia. Mild sigmoid diverticulosis   07/29/2019 Procedure   Ultrasound and fluoroscopically guided right internal jugular single lumen power port catheter insertion. Tip in the SVC/RA junction. Catheter ready for use.   08/03/2019 -  Chemotherapy   The patient had carboplatin and taxol for chemotherapy treatment.       REVIEW OF SYSTEMS:   Constitutional: Denies fevers, chills or abnormal weight loss Ears, nose, mouth, throat, and face: Denies mucositis or sore throat Respiratory: Denies cough, dyspnea or wheezes Cardiovascular: Denies palpitation, chest discomfort or lower extremity swelling Skin: Denies abnormal skin rashes Lymphatics: Denies new lymphadenopathy or easy bruising Behavioral/Psych: Mood is stable, no new changes  All other systems were reviewed with the patient and are negative.  I have reviewed the past medical history, past surgical history, social history and family history with the patient and they are unchanged from previous note.  ALLERGIES:  is allergic to amoxicillin and latex.  MEDICATIONS:  Current Outpatient Medications  Medication Sig Dispense Refill   AMITIZA 24 MCG capsule Take 48 mcg by mouth every evening.      B Complex Vitamins (B COMPLEX PO) Take 1 Dose by mouth daily. Liquid b complex      benazepril-hydrochlorthiazide (LOTENSIN HCT) 20-25 MG tablet Take 1 tablet by mouth every evening.  dexamethasone (DECADRON) 4 MG tablet Take 2 tabs at the night before and 2 tab the morning of chemotherapy, every 3 weeks, by mouth x 6 cycles total 24 tablet 0   diphenhydrAMINE (BENADRYL) 25 MG tablet Take 50 mg by mouth daily as needed for allergies.     gabapentin (NEURONTIN) 600 MG tablet Take 600-1,200 mg by mouth See admin instructions. Take 1200 mg in the morning, 600 mg in the afternoon, and 600 mg at night     lidocaine-prilocaine (EMLA) cream Apply to affected area once 30 g 3   Melatonin 5 MG CAPS Take 5 mg by mouth at bedtime.     meloxicam (MOBIC) 15 MG tablet Take 15 mg by mouth daily in the afternoon.      methocarbamol (ROBAXIN) 500 MG tablet Take 1,000 mg by mouth 2 (two) times daily as needed.     ofloxacin (OCUFLOX) 0.3 % ophthalmic solution Place 1 drop into the right eye 4 (four) times daily.      omeprazole (PRILOSEC OTC) 20 MG tablet Take 20 mg by mouth every evening.     ondansetron (ZOFRAN) 8 MG tablet Take 1 tablet (8 mg total) by mouth every 8 (eight) hours as needed. Start on the third day after chemotherapy. 60 tablet 3   oxyCODONE (OXY IR/ROXICODONE) 5 MG immediate release tablet Take 1 tablet (5 mg total) by mouth every 4 (four) hours as needed for severe pain. Do not take with tramadol and do not take and drive (Patient not taking: Reported on 08/24/2019) 10 tablet 0   Oxymetazoline HCl (VICKS SINEX 12 HOUR NA) Place 1 spray into the nose 2 (two) times daily as needed (congestion).     prochlorperazine (COMPAZINE) 10 MG tablet Take 1 tablet (10 mg total) by mouth every 6 (six) hours as needed (Nausea or vomiting). 30 tablet 1   tiZANidine (ZANAFLEX) 4 MG tablet Take 4 mg by mouth at bedtime.     topiramate (TOPAMAX) 25 MG tablet Take 75 mg by mouth at bedtime.     traMADol (ULTRAM) 50 MG tablet Take 1 tablet (50 mg total) by mouth every 6 (six) hours  as needed for severe pain. For AFTER surgery, do not take and drive (Patient not taking: Reported on 08/24/2019) 10 tablet 0   No current facility-administered medications for this visit.   Facility-Administered Medications Ordered in Other Visits  Medication Dose Route Frequency Provider Last Rate Last Admin   CARBOplatin (PARAPLATIN) 480 mg in sodium chloride 0.9 % 250 mL chemo infusion  480 mg Intravenous Once Alvy Bimler, Ni, MD       dexamethasone (DECADRON) 10 mg in sodium chloride 0.9 % 50 mL IVPB  10 mg Intravenous Once Alvy Bimler, Ni, MD       diphenhydrAMINE (BENADRYL) injection 50 mg  50 mg Intravenous Once Alvy Bimler, Ni, MD       famotidine (PEPCID) IVPB 20 mg premix  20 mg Intravenous Once Alvy Bimler, Ni, MD       fosaprepitant (EMEND) 150 mg in sodium chloride 0.9 % 145 mL IVPB  150 mg Intravenous Once Alvy Bimler, Ni, MD       heparin lock flush 100 unit/mL  500 Units Intracatheter Once PRN Alvy Bimler, Ni, MD       PACLitaxel (TAXOL) 120 mg in sodium chloride 0.9 % 250 mL chemo infusion (> 22m/m2)  120 mg Intravenous Once Gorsuch, Ni, MD       palonosetron (ALOXI) injection 0.25 mg  0.25 mg Intravenous Once Gorsuch, Ni,  MD       sodium chloride flush (NS) 0.9 % injection 10 mL  10 mL Intracatheter PRN Alvy Bimler, Luay Balding, MD        PHYSICAL EXAMINATION: ECOG PERFORMANCE STATUS: 2 - Symptomatic, <50% confined to bed  Vitals:   10/12/19 1058  BP: (!) 159/67  Pulse: 96  Resp: 18  Temp: 98.5 F (36.9 C)  SpO2: 96%   Filed Weights   10/12/19 1058  Weight: 239 lb 9.6 oz (108.7 kg)    GENERAL:alert, no distress and comfortable SKIN: skin color, texture, turgor are normal, no rashes or significant lesions EYES: normal, noted changes to the right eye OROPHARYNX:no exudate, no erythema and lips, buccal mucosa, and tongue normal  NECK: supple, thyroid normal size, non-tender, without nodularity LYMPH:  no palpable lymphadenopathy in the cervical, axillary or inguinal LUNGS: clear to  auscultation and percussion with normal breathing effort HEART: regular rate & rhythm and no murmurs and no lower extremity edema ABDOMEN:abdomen soft, non-tender and normal bowel sounds Musculoskeletal:no cyanosis of digits and no clubbing  NEURO: alert & oriented x 3 with fluent speech, no focal motor/sensory deficits  LABORATORY DATA:  I have reviewed the data as listed    Component Value Date/Time   NA 139 10/12/2019 1016   K 3.8 10/12/2019 1016   CL 102 10/12/2019 1016   CO2 26 10/12/2019 1016   GLUCOSE 169 (H) 10/12/2019 1016   BUN 15 10/12/2019 1016   CREATININE 1.54 (H) 10/12/2019 1016   CALCIUM 9.6 10/12/2019 1016   PROT 6.8 10/12/2019 1016   ALBUMIN 3.7 10/12/2019 1016   AST 15 10/12/2019 1016   ALT 21 10/12/2019 1016   ALKPHOS 44 10/12/2019 1016   BILITOT 0.4 10/12/2019 1016   GFRNONAA 33 (L) 10/12/2019 1016   GFRAA 38 (L) 10/12/2019 1016    No results found for: SPEP, UPEP  Lab Results  Component Value Date   WBC 5.5 10/12/2019   NEUTROABS 4.8 10/12/2019   HGB 9.8 (L) 10/12/2019   HCT 29.2 (L) 10/12/2019   MCV 92.4 10/12/2019   PLT 260 10/12/2019      Chemistry      Component Value Date/Time   NA 139 10/12/2019 1016   K 3.8 10/12/2019 1016   CL 102 10/12/2019 1016   CO2 26 10/12/2019 1016   BUN 15 10/12/2019 1016   CREATININE 1.54 (H) 10/12/2019 1016      Component Value Date/Time   CALCIUM 9.6 10/12/2019 1016   ALKPHOS 44 10/12/2019 1016   AST 15 10/12/2019 1016   ALT 21 10/12/2019 1016   BILITOT 0.4 10/12/2019 1016

## 2019-10-12 NOTE — Assessment & Plan Note (Signed)
We discussed the importance of taking laxatives on a regular basis 

## 2019-10-12 NOTE — Assessment & Plan Note (Signed)
She has mild acute on chronic renal failure I plan to proceed with chemotherapy with dose adjustment

## 2019-10-13 ENCOUNTER — Telehealth: Payer: Self-pay | Admitting: *Deleted

## 2019-10-13 NOTE — Progress Notes (Signed)
  adiation Oncology         [phone]) [phone] ________________________________  Name: Heidi Avila MRN: [id number]  Date: 10/14/2019  DOB: 1945/09/25  CC: [facility]  [place] Lopes, Corinna Lines, MD  HDR BRACHYTHERAPY NOTE  DIAGNOSIS: Stage IIIC1grade 1 endometrioid endometrial adenocarcinoma   Simple treatment device note: Patient had construction of her custom vaginal cylinder. She will be treated with a 3.0 cm diameter segmented cylinder. This conforms to her anatomy without undue discomfort.  Vaginal brachytherapy procedure node: The patient was brought to the [place] suite. Identity was confirmed. All relevant records and images related to the planned course of therapy were reviewed. The patient freely provided informed written consent to proceed with treatment after reviewing the details related to the planned course of therapy. The consent form was witnessed and verified by the simulation staff. Then, the patient was set-up in a stable reproducible supine position for radiation therapy. Pelvic exam revealed the vaginal cuff to be intact . The patient's custom vaginal cylinder was placed in the proximal vagina. This was affixed to the CT/MR stabilization plate to prevent slippage. Patient tolerated the placement well.  Verification simulation note:  A fiducial marker was placed within the vaginal cylinder. An AP  film was then obtained through the pelvis area. This documented accurate position of the vaginal cylinder for treatment.  HDR BRACHYTHERAPY TREATMENT  The remote afterloading device was affixed to the vaginal cylinder by catheter. Patient then proceeded to undergo her fifth high-dose-rate treatment directed at the proximal vagina. The patient was prescribed a dose of 6.0 gray to be delivered to the mucosal surface. Treatment length was 3.0 cm. Patient was treated with 1 channel using 7 dwell positions. Treatment time was 363.8 seconds. Iridium 192 was the high-dose-rate source for  treatment. The patient tolerated the treatment well. After completion of her therapy, a radiation survey was performed documenting return of the iridium source into the [employer] safe.   PLAN: Patient will follow-up with radiation oncology in one month. ________________________________    [name], PhD, MD  This document serves as a record of services personally performed by Gery Pray, MD. It was created on his behalf by Clerance Lav, a trained medical scribe. The creation of this record is based on the scribe's personal observations and the provider's statements to them. This document has been checked and approved by the attending provider.

## 2019-10-13 NOTE — Telephone Encounter (Signed)
CALLED PATIENT TO REMIND OF HDR [place] 10-14-19 @ 1 PM, SPOKE WITH PATIENT AND SHE IS AWARE OF THIS APPT.

## 2019-10-14 ENCOUNTER — Encounter: Payer: Self-pay | Admitting: Radiation Oncology

## 2019-10-14 ENCOUNTER — Ambulatory Visit
Admission: RE | Admit: 2019-10-14 | Discharge: 2019-10-14 | Disposition: A | Discharge status: Home / Self Care | Payer: Medicare Other | Source: Ambulatory Visit | Attending: Radiation Oncology | Admitting: Radiation Oncology

## 2019-10-14 DIAGNOSIS — C541 Malignant neoplasm of endometrium: Secondary | ICD-10-CM | POA: Diagnosis not present

## 2019-10-28 NOTE — Progress Notes (Incomplete)
  Patient Name: Heidi Avila MRN: [id number] DOB: 17-Aug-1945 Referring Physician: Jeral Pinch Date of Service: 10/14/2019 [facility], [place]                                                        End Of Treatment Note  Diagnoses: C54.1-Malignant neoplasm of endometrium  Cancer Staging: Stage IIIC1grade 1 endometrioid endometrial adenocarcinoma  Intent: Curative  Radiation Treatment Dates: 09/07/2019 through 10/14/2019 Site Technique Total Dose (Gy) Dose per Fx (Gy) Completed Fx Beam Energies  Vagina: Pelvis HDR-brachy 30/30 6 5/5 Ir-192   Narrative: The patient tolerated radiation therapy relatively well. She did not report any radiation-related side effects during treatment.  Plan: The patient will follow-up with radiation oncology in one month.  ________________________________________________   [name], PhD, MD  This document serves as a record of services personally performed by Gery Pray, MD. It was created on his behalf by Clerance Lav, a trained medical scribe. The creation of this record is based on the scribe's personal observations and the provider's statements to them. This document has been checked and approved by the attending provider.

## 2019-11-02 ENCOUNTER — Inpatient Hospital Stay: Payer: Medicare Other

## 2019-11-02 ENCOUNTER — Other Ambulatory Visit: Payer: Self-pay

## 2019-11-02 ENCOUNTER — Encounter: Payer: Self-pay | Admitting: Hematology and Oncology

## 2019-11-02 ENCOUNTER — Inpatient Hospital Stay: Payer: Medicare Other | Attending: Hematology and Oncology | Admitting: Hematology and Oncology

## 2019-11-02 DIAGNOSIS — K5909 Other constipation: Secondary | ICD-10-CM | POA: Diagnosis not present

## 2019-11-02 DIAGNOSIS — D61818 Other pancytopenia: Secondary | ICD-10-CM | POA: Insufficient documentation

## 2019-11-02 DIAGNOSIS — N183 Chronic kidney disease, stage 3 unspecified: Secondary | ICD-10-CM | POA: Diagnosis not present

## 2019-11-02 DIAGNOSIS — C541 Malignant neoplasm of endometrium: Secondary | ICD-10-CM

## 2019-11-02 DIAGNOSIS — Z5111 Encounter for antineoplastic chemotherapy: Secondary | ICD-10-CM | POA: Insufficient documentation

## 2019-11-02 DIAGNOSIS — G629 Polyneuropathy, unspecified: Secondary | ICD-10-CM

## 2019-11-02 LAB — CBC WITH DIFFERENTIAL (CANCER CENTER ONLY)
Abs Immature Granulocytes: 0.04 10*3/uL (ref 0.00–0.07)
Basophils Absolute: 0 10*3/uL (ref 0.0–0.1)
Basophils Relative: 1 %
Eosinophils Absolute: 0.1 10*3/uL (ref 0.0–0.5)
Eosinophils Relative: 2 %
HCT: 24.7 % — ABNORMAL LOW (ref 36.0–46.0)
Hemoglobin: 8.3 g/dL — ABNORMAL LOW (ref 12.0–15.0)
Immature Granulocytes: 1 %
Lymphocytes Relative: 16 %
Lymphs Abs: 0.7 10*3/uL (ref 0.7–4.0)
MCH: 32.4 pg (ref 26.0–34.0)
MCHC: 33.6 g/dL (ref 30.0–36.0)
MCV: 96.5 fL (ref 80.0–100.0)
Monocytes Absolute: 0.4 10*3/uL (ref 0.1–1.0)
Monocytes Relative: 10 %
Neutro Abs: 3 10*3/uL (ref 1.7–7.7)
Neutrophils Relative %: 70 %
Platelet Count: 130 10*3/uL — ABNORMAL LOW (ref 150–400)
RBC: 2.56 MIL/uL — ABNORMAL LOW (ref 3.87–5.11)
RDW: 19.4 % — ABNORMAL HIGH (ref 11.5–15.5)
WBC Count: 4.2 10*3/uL (ref 4.0–10.5)
nRBC: 0.5 % — ABNORMAL HIGH (ref 0.0–0.2)

## 2019-11-02 LAB — CMP (CANCER CENTER ONLY)
ALT: 15 U/L (ref 0–44)
AST: 14 U/L — ABNORMAL LOW (ref 15–41)
Albumin: 3.6 g/dL (ref 3.5–5.0)
Alkaline Phosphatase: 42 U/L (ref 38–126)
Anion gap: 12 (ref 5–15)
BUN: 14 mg/dL (ref 8–23)
CO2: 23 mmol/L (ref 22–32)
Calcium: 9.6 mg/dL (ref 8.9–10.3)
Chloride: 102 mmol/L (ref 98–111)
Creatinine: 1.45 mg/dL — ABNORMAL HIGH (ref 0.44–1.00)
GFR, Est AFR Am: 41 mL/min — ABNORMAL LOW (ref >60)
GFR, Estimated: 36 mL/min — ABNORMAL LOW (ref >60)
Glucose, Bld: 121 mg/dL — ABNORMAL HIGH (ref 70–99)
Potassium: 3.8 mmol/L (ref 3.5–5.1)
Sodium: 137 mmol/L (ref 135–145)
Total Bilirubin: 0.4 mg/dL (ref 0.3–1.2)
Total Protein: 6.8 g/dL (ref 6.5–8.1)

## 2019-11-02 MED ORDER — HEPARIN SOD (PORK) LOCK FLUSH 100 UNIT/ML IV SOLN
500.0000 [IU] | Freq: Once | INTRAVENOUS | Status: AC | PRN
  Administered 2019-11-02: 500 [IU]
  Filled 2019-11-02: qty 5

## 2019-11-02 MED ORDER — SODIUM CHLORIDE 0.9% FLUSH
10.0000 mL | INTRAVENOUS | Status: DC | PRN
  Administered 2019-11-02: 10 mL
  Filled 2019-11-02: qty 10

## 2019-11-02 MED ORDER — SODIUM CHLORIDE 0.9 % IV SOLN
Freq: Once | INTRAVENOUS | Status: AC
  Filled 2019-11-02: qty 250

## 2019-11-02 MED ORDER — DIPHENHYDRAMINE HCL 50 MG/ML IJ SOLN
INTRAMUSCULAR | Status: AC
  Filled 2019-11-02: qty 1

## 2019-11-02 MED ORDER — FAMOTIDINE IN NACL 20-0.9 MG/50ML-% IV SOLN
INTRAVENOUS | Status: AC
  Filled 2019-11-02: qty 50

## 2019-11-02 MED ORDER — PALONOSETRON HCL INJECTION 0.25 MG/5ML
0.2500 mg | Freq: Once | INTRAVENOUS | Status: AC
  Administered 2019-11-02: 0.25 mg via INTRAVENOUS

## 2019-11-02 MED ORDER — SODIUM CHLORIDE 0.9% FLUSH
10.0000 mL | Freq: Once | INTRAVENOUS | Status: AC
  Administered 2019-11-02: 10 mL
  Filled 2019-11-02: qty 10

## 2019-11-02 MED ORDER — SODIUM CHLORIDE 0.9 % IV SOLN
150.0000 mg | Freq: Once | INTRAVENOUS | Status: AC
  Administered 2019-11-02: 150 mg via INTRAVENOUS
  Filled 2019-11-02: qty 150

## 2019-11-02 MED ORDER — FAMOTIDINE IN NACL 20-0.9 MG/50ML-% IV SOLN
20.0000 mg | Freq: Once | INTRAVENOUS | Status: AC
  Administered 2019-11-02: 20 mg via INTRAVENOUS

## 2019-11-02 MED ORDER — SODIUM CHLORIDE 0.9 % IV SOLN
337.2000 mg | Freq: Once | INTRAVENOUS | Status: AC
  Administered 2019-11-02: 340 mg via INTRAVENOUS
  Filled 2019-11-02: qty 34

## 2019-11-02 MED ORDER — PALONOSETRON HCL INJECTION 0.25 MG/5ML
INTRAVENOUS | Status: AC
  Filled 2019-11-02: qty 5

## 2019-11-02 MED ORDER — DIPHENHYDRAMINE HCL 50 MG/ML IJ SOLN
50.0000 mg | Freq: Once | INTRAMUSCULAR | Status: AC
  Administered 2019-11-02: 50 mg via INTRAVENOUS

## 2019-11-02 MED ORDER — SODIUM CHLORIDE 0.9 % IV SOLN
10.0000 mg | Freq: Once | INTRAVENOUS | Status: AC
  Administered 2019-11-02: 10 mg via INTRAVENOUS
  Filled 2019-11-02: qty 10

## 2019-11-02 MED ORDER — SODIUM CHLORIDE 0.9 % IV SOLN
53.5000 mg/m2 | Freq: Once | INTRAVENOUS | Status: AC
  Administered 2019-11-02: 120 mg via INTRAVENOUS
  Filled 2019-11-02: qty 20

## 2019-11-02 NOTE — Progress Notes (Signed)
[place] OFFICE PROGRESS NOTE  Patient Care Team: [facility], [place] as PCP - General Awanda Mink, Craige Cotta, RN as Oncology Nurse Navigator (Oncology) Gery Pray, MD as Consulting Physician (Radiation Oncology)  ASSESSMENT & PLAN:  Endometrial cancer ([place]) Overall, she tolerated treatment very poorly with progressive pancytopenia despite multiple dose adjustment We will proceed with treatment today with further dose adjustment She does not need transfusion support but I will get an extra tube drawn in her next visit in case she need transfusion I will also delay her treatment by 1 week next cycle to allow bone marrow recovery  Peripheral polyneuropathy She complain of intermittent severe neuropathy while on treatment.  With recent drastic dose adjustment of Taxol, her neuropathy is still bad  For now, we will continue with treatment with dose adjustment  Pancytopenia, acquired ([place]) She has progressive pancytopenia despite multiple dose adjustment We will proceed with further carboplatin dose adjustment today She does not need transfusion support today I will also delay her next cycle of treatment   Other constipation We discussed the importance of taking laxatives on a regular basis  CKD (chronic kidney disease), stage III She has mild intermittent acute on chronic renal failure We will adjust the dose of carboplatin accordingly   No orders of the defined types were placed in this encounter.   All questions were answered. The patient knows to call the clinic with any problems, questions or concerns. The total time spent in the appointment was 30 minutes encounter with patients including review of chart and various tests results, discussions about plan of care and coordination of care plan   Heath Lark, MD 11/02/2019 11:52 AM  INTERVAL HISTORY: Please see below for problem oriented charting. She is seen prior to cycle 5 of therapy She complains of  fatigue She have shortness of breath on moderate exertion She denies worsening peripheral neuropathy although at times, it is described as severe The patient denies any recent signs or symptoms of bleeding such as spontaneous epistaxis, hematuria or hematochezia. She has recent constipation but takes laxative No recent nausea vomiting No recent infection, fever or chills  SUMMARY OF ONCOLOGIC HISTORY: Oncology History Overview Note  Endometrioid, MSI High, MLH1 promoter hypermethylation present   Endometrial cancer ([place])  05/18/2019 Initial Biopsy   EMB: Complex atypical hyperplasia, cannot rule out microscopic focus of FIGO grade 1 carcinoma   05/18/2019 Initial Diagnosis   Presented in February 2021 with postmenopausal bleeding.  She was started on Provera and underwent endometrial biopsy showing complex atypical hyperplasia with a possible focus suspicious for grade 1 endometrial cancer.   05/21/2019 Imaging   Pelvic ultrasound at [facility]: Anteverted uterus measuring 8.2 cm in greatest dimension.  Endometrial lining measures 1.73 cm and is thickened.  Left ovary not visualized, right ovary normal in appearance.   06/30/2019 Surgery   Robotic-assisted laparoscopic total hysterectomy with bilateral salpingoophorectomy, bilateral pelvic LND  Frozen section showed endometrial cancer with more than 50% myometrial invasion.  Patient did not map to a sentinel lymph node bilaterally.   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  Tumor measures 3.5 cm and invades focally into the serosa.  No involvement of the cervix or bilateral adnexa.  1 of 20 lymph nodes positive for metastatic carcinoma, focus measures 1 cm without evidence of extracapsular extension.  FINAL MICROSCOPIC DIAGNOSIS: A. UTERUS, CERVIX, BILATERAL FALLOPIAN TUBES AND OVARIES, HYSTERECTOMY WITH SALPINGECTOMY: - Endometrioid adenocarcinoma, 3.5 cm, FIGO grade  1 - Carcinoma  invades through the entire myometrium and focally into the serosal surface - Benign unremarkable cervix - Benign unremarkable bilateral fallopian tubes and left ovary - Benign serous inclusion cyst of right ovary - See oncology table B. LYMPH NODE, RIGHT PELVIC, BIOPSY: - Metastatic carcinoma to one of twelve lymph nodes (1/12) - Focus of metastatic carcinoma measures 1.0 cm, without evidence of extracapsular extension C. LYMPH NODE, LEFT PELVIC, BIOPSY: - Eight benign lymph nodes (0/8) ONCOLOGY TABLE: UTERUS, CARCINOMA OR CARCINOSARCOMA Procedure: Total hysterectomy and bilateral salpingo-oophorectomy Histologic type: Endometrioid adenocarcinoma Histologic Grade: FIGO grade 1 Myometrial invasion: Depth of invasion: 30 mm Myometrial thickness: 30 mm Uterine Serosa Involvement: Present, focal Cervical stromal involvement: Not identified Extent of involvement of other organs: Not identified Lymphovascular invasion: Not identified Regional Lymph Nodes: Examined: 0 Sentinel 20 Non-sentinel 20 Total Lymph nodes with metastasis: 1 Isolated tumor cells (<0.2 mm): 0 Micrometastasis: (>0.2 mm and < 2.0 mm): 0 Macrometastasis: (>2.0 mm): 1 Extracapsular extension: Not identified Representative Tumor Block: A6 MMR / MSI testing: Will be ordered Pathologic Stage Classification (pTNM, AJCC 8th edition): pT3, pN1a Comments: None Mismatch Repair Protein (IHC) SUMMARY INTERPRETATION: ABNORMAL 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 The loss of expression may be secondary to promoter hyper-methylation, gene mutation or other genetic event. BRAF mutation testing and/or MLH1 methylation testing is indicated. The presence of a BRAF mutation and/or MLH1 hypermethylation is indicative of a sporadic-type tumor. The absence of either BRAF mutation and/or presence of normal methylation indicate the possible presence of a hereditary germline mutation (e.g.Lynch syndrome) and  referral to genetic counseling is warranted. It is recommended that the loss of protein expression be correlated with molecular based MSI testing   06/30/2019 Cancer Staging   Staging form: Corpus Uteri - Carcinoma and Carcinosarcoma, AJCC 8th Edition - Clinical stage from 06/30/2019: FIGO Stage IIIC1 (cT3, cN1a, cM0) - Signed by Lafonda Mosses, MD on 07/09/2019   07/16/2019 Imaging   1. No evidence of metastatic disease in the chest, abdomen or pelvis. 2. Status post hysterectomy. Small volume simple ascites. Small amount of ill-defined fluid in the pelvic sidewalls, compatible with recent surgery. 3. Small umbilical hernia containing fat and trace ascitic fluid. 4. Chronic findings include: Aortic Atherosclerosis (ICD10-I70.0). Small hiatal hernia. Mild sigmoid diverticulosis   07/29/2019 Procedure   Ultrasound and fluoroscopically guided right internal jugular single lumen power port catheter insertion. Tip in the SVC/RA junction. Catheter ready for use.   08/03/2019 -  Chemotherapy   The patient had carboplatin and taxol for chemotherapy treatment.       REVIEW OF SYSTEMS:   Constitutional: Denies fevers, chills or abnormal weight loss Eyes: Denies blurriness of vision Ears, nose, mouth, throat, and face: Denies mucositis or sore throat Cardiovascular: Denies palpitation, chest discomfort or lower extremity swelling Skin: Denies abnormal skin rashes Lymphatics: Denies new lymphadenopathy or easy bruising Neurological:Denies numbness, tingling or new weaknesses Behavioral/Psych: Mood is stable, no new changes  All other systems were reviewed with the patient and are negative.  I have reviewed the past medical history, past surgical history, social history and family history with the patient and they are unchanged from previous note.  ALLERGIES:  is allergic to amoxicillin and latex.  MEDICATIONS:  Current Outpatient Medications  Medication Sig Dispense Refill   AMITIZA 24 MCG  capsule Take 48 mcg by mouth every evening.      B Complex Vitamins (B COMPLEX PO) Take 1 Dose  by mouth daily. Liquid b complex     benazepril-hydrochlorthiazide (LOTENSIN HCT) 20-25 MG tablet Take 1 tablet by mouth every evening.      dexamethasone (DECADRON) 4 MG tablet Take 2 tabs at the night before and 2 tab the morning of chemotherapy, every 3 weeks, by mouth x 6 cycles total 24 tablet 0   diphenhydrAMINE (BENADRYL) 25 MG tablet Take 50 mg by mouth daily as needed for allergies.     gabapentin (NEURONTIN) 600 MG tablet Take 600-1,200 mg by mouth See admin instructions. Take 1200 mg in the morning, 600 mg in the afternoon, and 600 mg at night     Melatonin 5 MG CAPS Take 5 mg by mouth at bedtime.     meloxicam (MOBIC) 15 MG tablet Take 15 mg by mouth daily in the afternoon.      methocarbamol (ROBAXIN) 500 MG tablet Take 1,000 mg by mouth 2 (two) times daily as needed.     ofloxacin (OCUFLOX) 0.3 % ophthalmic solution Place 1 drop into the right eye 4 (four) times daily.      omeprazole (PRILOSEC OTC) 20 MG tablet Take 20 mg by mouth every evening.     ondansetron (ZOFRAN) 8 MG tablet Take 1 tablet (8 mg total) by mouth every 8 (eight) hours as needed. Start on the third day after chemotherapy. 60 tablet 3   Oxymetazoline HCl (VICKS SINEX 12 HOUR NA) Place 1 spray into the nose 2 (two) times daily as needed (congestion).     prochlorperazine (COMPAZINE) 10 MG tablet Take 1 tablet (10 mg total) by mouth every 6 (six) hours as needed (Nausea or vomiting). 30 tablet 1   tiZANidine (ZANAFLEX) 4 MG tablet Take 4 mg by mouth at bedtime.     topiramate (TOPAMAX) 25 MG tablet Take 75 mg by mouth at bedtime.     No current facility-administered medications for this visit.   Facility-Administered Medications Ordered in Other Visits  Medication Dose Route Frequency Provider Last Rate Last Admin   CARBOplatin (PARAPLATIN) 340 mg in sodium chloride 0.9 % 250 mL chemo infusion  340 mg  Intravenous Once Alvy Bimler, Attie Nawabi, MD       heparin lock flush 100 unit/mL  500 Units Intracatheter Once PRN Alvy Bimler, Shimeka Bacot, MD       PACLitaxel (TAXOL) 120 mg in sodium chloride 0.9 % 250 mL chemo infusion (> 72m/m2)  53.5 mg/m2 (Treatment Plan Recorded) Intravenous Once Katyana Trolinger, MD       sodium chloride flush (NS) 0.9 % injection 10 mL  10 mL Intracatheter PRN Donalee Gaumond, MD        PHYSICAL EXAMINATION: ECOG PERFORMANCE STATUS: 2 - Symptomatic, <50% confined to bed  Vitals:   11/02/19 0957  BP: 105/85  Pulse: 83  Resp: 18  Temp: 98.2 F (36.8 C)   Filed Weights   11/02/19 0957  Weight: 240 lb 12.8 oz (109.2 kg)    GENERAL:alert, no distress and comfortable.  She looks pale Musculoskeletal:no cyanosis of digits and no clubbing  NEURO: alert & oriented x 3 with fluent speech, no focal motor/sensory deficits  LABORATORY DATA:  I have reviewed the data as listed    Component Value Date/Time   NA 137 11/02/2019 0904   K 3.8 11/02/2019 0904   CL 102 11/02/2019 0904   CO2 23 11/02/2019 0904   GLUCOSE 121 (H) 11/02/2019 0904   BUN 14 11/02/2019 0904   CREATININE 1.45 (H) 11/02/2019 0904   CALCIUM 9.6 11/02/2019 0904  PROT 6.8 11/02/2019 0904   ALBUMIN 3.6 11/02/2019 0904   AST 14 (L) 11/02/2019 0904   ALT 15 11/02/2019 0904   ALKPHOS 42 11/02/2019 0904   BILITOT 0.4 11/02/2019 0904   GFRNONAA 36 (L) 11/02/2019 0904   GFRAA 41 (L) 11/02/2019 0904    No results found for: SPEP, UPEP  Lab Results  Component Value Date   WBC 4.2 11/02/2019   NEUTROABS 3.0 11/02/2019   HGB 8.3 (L) 11/02/2019   HCT 24.7 (L) 11/02/2019   MCV 96.5 11/02/2019   PLT 130 (L) 11/02/2019      Chemistry      Component Value Date/Time   NA 137 11/02/2019 0904   K 3.8 11/02/2019 0904   CL 102 11/02/2019 0904   CO2 23 11/02/2019 0904   BUN 14 11/02/2019 0904   CREATININE 1.45 (H) 11/02/2019 0904      Component Value Date/Time   CALCIUM 9.6 11/02/2019 0904   ALKPHOS 42 11/02/2019  0904   AST 14 (L) 11/02/2019 0904   ALT 15 11/02/2019 0904   BILITOT 0.4 11/02/2019 0904

## 2019-11-02 NOTE — Patient Instructions (Signed)
[facility] Discharge Instructions for Patients Receiving Chemotherapy  Today you received the following chemotherapy agents carboplatin and taxol.  To help prevent nausea and vomiting after your treatment, we encourage you to take your nausea medication as directed  BUT NO ZOFRAN FOR 3 DAYS.   If you develop nausea and vomiting that is not controlled by your nausea medication, call the clinic.   BELOW ARE SYMPTOMS THAT SHOULD BE REPORTED IMMEDIATELY:  *FEVER GREATER THAN 100.5 F  *CHILLS WITH OR WITHOUT FEVER  NAUSEA AND VOMITING THAT IS NOT CONTROLLED WITH YOUR NAUSEA MEDICATION  *UNUSUAL SHORTNESS OF BREATH  *UNUSUAL BRUISING OR BLEEDING  TENDERNESS IN MOUTH AND THROAT WITH OR WITHOUT PRESENCE OF ULCERS  *URINARY PROBLEMS  *BOWEL PROBLEMS  UNUSUAL RASH Items with * indicate a potential emergency and should be followed up as soon as possible.  Feel free to call the clinic you have any questions or concerns. The clinic phone number is [phone].  Please show the [place] at check-in to the Emergency Department and triage nurse.

## 2019-11-02 NOTE — Assessment & Plan Note (Signed)
She has progressive pancytopenia despite multiple dose adjustment We will proceed with further carboplatin dose adjustment today She does not need transfusion support today I will also delay her next cycle of treatment

## 2019-11-02 NOTE — Assessment & Plan Note (Signed)
She complain of intermittent severe neuropathy while on treatment.  With recent drastic dose adjustment of Taxol, her neuropathy is still bad  For now, we will continue with treatment with dose adjustment

## 2019-11-02 NOTE — Assessment & Plan Note (Signed)
We discussed the importance of taking laxatives on a regular basis 

## 2019-11-02 NOTE — Assessment & Plan Note (Signed)
Overall, she tolerated treatment very poorly with progressive pancytopenia despite multiple dose adjustment We will proceed with treatment today with further dose adjustment She does not need transfusion support but I will get an extra tube drawn in her next visit in case she need transfusion I will also delay her treatment by 1 week next cycle to allow bone marrow recovery

## 2019-11-02 NOTE — Assessment & Plan Note (Signed)
She has mild intermittent acute on chronic renal failure We will adjust the dose of carboplatin accordingly

## 2019-11-09 DIAGNOSIS — M170 Bilateral primary osteoarthritis of knee: Secondary | ICD-10-CM | POA: Diagnosis not present

## 2019-11-10 ENCOUNTER — Other Ambulatory Visit: Payer: Self-pay | Admitting: Hematology and Oncology

## 2019-11-10 ENCOUNTER — Other Ambulatory Visit: Payer: Self-pay | Admitting: Gynecologic Oncology

## 2019-11-10 ENCOUNTER — Telehealth: Payer: Self-pay | Admitting: Oncology

## 2019-11-10 DIAGNOSIS — N8502 Endometrial intraepithelial neoplasia [EIN]: Secondary | ICD-10-CM

## 2019-11-10 DIAGNOSIS — C541 Malignant neoplasm of endometrium: Secondary | ICD-10-CM

## 2019-11-10 NOTE — Telephone Encounter (Signed)
Called Heidi Avila regarding Tramadol refill request.  She said she does not want it refilled.

## 2019-11-12 ENCOUNTER — Ambulatory Visit
Admission: RE | Admit: 2019-11-12 | Discharge: 2019-11-12 | Disposition: A | Discharge status: Home / Self Care | Payer: Medicare Other | Source: Ambulatory Visit | Attending: Radiation Oncology | Admitting: Radiation Oncology

## 2019-11-12 ENCOUNTER — Encounter: Payer: Self-pay | Admitting: Radiation Oncology

## 2019-11-12 ENCOUNTER — Telehealth: Payer: Self-pay | Admitting: *Deleted

## 2019-11-12 ENCOUNTER — Other Ambulatory Visit: Payer: Self-pay

## 2019-11-12 VITALS — BP 144/62 | HR 92 | Temp 98.7°F | Resp 20 | Wt 240.6 lb

## 2019-11-12 DIAGNOSIS — C541 Malignant neoplasm of endometrium: Secondary | ICD-10-CM | POA: Diagnosis not present

## 2019-11-12 DIAGNOSIS — R0602 Shortness of breath: Secondary | ICD-10-CM | POA: Diagnosis not present

## 2019-11-12 DIAGNOSIS — Z79899 Other long term (current) drug therapy: Secondary | ICD-10-CM | POA: Diagnosis not present

## 2019-11-12 DIAGNOSIS — K5900 Constipation, unspecified: Secondary | ICD-10-CM | POA: Diagnosis not present

## 2019-11-12 DIAGNOSIS — G629 Polyneuropathy, unspecified: Secondary | ICD-10-CM | POA: Insufficient documentation

## 2019-11-12 DIAGNOSIS — M2550 Pain in unspecified joint: Secondary | ICD-10-CM | POA: Diagnosis not present

## 2019-11-12 DIAGNOSIS — N179 Acute kidney failure, unspecified: Secondary | ICD-10-CM | POA: Insufficient documentation

## 2019-11-12 DIAGNOSIS — Z08 Encounter for follow-up examination after completed treatment for malignant neoplasm: Secondary | ICD-10-CM | POA: Diagnosis not present

## 2019-11-12 DIAGNOSIS — R5383 Other fatigue: Secondary | ICD-10-CM | POA: Diagnosis not present

## 2019-11-12 NOTE — Progress Notes (Signed)
Radiation Oncology         [phone] ________________________________  Name: Heidi Avila MRN: [id number]  Date: 11/12/2019  DOB: [birth year]  Follow-Up Visit Note  CC: [facility], [facility]  Lafonda Mosses, MD    ICD-10-CM   1. Endometrial cancer ([facility])  C54.1     Diagnosis: Stage IIIC1grade 1 endometrioid endometrial adenocarcinoma  Interval Since Last Radiation: One month.  Radiation Treatment Dates: 09/07/2019 through 10/14/2019 Site Technique Total Dose (Gy) Dose per Fx (Gy) Completed Fx Beam Energies  Vagina: Pelvis HDR-brachy 30/30 6 5/5 Ir-192   Narrative:  The patient returns today for routine follow-up. Since the end of treatment, she was seen by Dr. Alvy Bimler on 11/02/2019. At that time, she proceeded with cycle #5 of chemotherapy with Carboplatin and Taxol. She has tolerated treatment very poorly with progressive pancytopenia despite multiple dose adjustments. Next cycle will be delayed by one week to allow bone marrow recovery. She has also experienced peripheral neuropathy, constipation, and mild intermittent acute on chronic renal failure.  On review of systems, she reports having low platelets and feeling very tired. She also reports ongoing joint pain, fatigue, and shortness of breath. She denies vaginal bleeding, pain, and dysuria.  ALLERGIES:  is allergic to amoxicillin and latex.  Meds: Current Outpatient Medications  Medication Sig Dispense Refill  . AMITIZA 24 MCG capsule Take 48 mcg by mouth every evening.     . B Complex Vitamins (B COMPLEX PO) Take 1 Dose by mouth daily. Liquid b complex    . benazepril-hydrochlorthiazide (LOTENSIN HCT) 20-25 MG tablet Take 1 tablet by mouth every evening.     Marland Kitchen dexamethasone (DECADRON) 4 MG tablet Take 2 tabs at the night before and 2 tab the morning of chemotherapy, every 3 weeks, by mouth x 6 cycles total 24 tablet 0  . diphenhydrAMINE (BENADRYL) 25 MG tablet Take 50 mg by mouth daily as needed for allergies.     Marland Kitchen gabapentin (NEURONTIN) 600 MG tablet Take 600-1,200 mg by mouth See admin instructions. Take 1200 mg in the morning, 600 mg in the afternoon, and 600 mg at night    . Melatonin 5 MG CAPS Take 5 mg by mouth at bedtime.    . meloxicam (MOBIC) 15 MG tablet Take 15 mg by mouth daily in the afternoon.     . methocarbamol (ROBAXIN) 500 MG tablet Take 1,000 mg by mouth 2 (two) times daily as needed.    . Oxymetazoline HCl (VICKS SINEX 12 HOUR NA) Place 1 spray into the nose 2 (two) times daily as needed (congestion).    Marland Kitchen tiZANidine (ZANAFLEX) 4 MG tablet Take 4 mg by mouth at bedtime.    . topiramate (TOPAMAX) 25 MG tablet Take 75 mg by mouth at bedtime.     No current facility-administered medications for this encounter.    Physical Findings: The patient is in no acute distress. Patient is alert and oriented.  weight is 240 lb 9.6 oz (109.1 kg) (abnormal). Her temperature is 98.7 F (37.1 C). Her blood pressure is 144/62 (abnormal) and her pulse is 92. Her respiration is 20 and oxygen saturation is 98%.  No significant changes. Lungs are clear to auscultation bilaterally. Heart has regular rate and rhythm. No palpable cervical, supraclavicular, or axillary adenopathy. Abdomen soft, non-tender, normal bowel sounds. Pelvic exam deferred in light of recent treatment.  Lab Findings: Lab Results  Component Value Date   WBC 4.2 11/02/2019   HGB 8.3 (L) 11/02/2019   HCT  24.7 (L) 11/02/2019   MCV 96.5 11/02/2019   PLT 130 (L) 11/02/2019    Radiographic Findings: No results found.  Impression: Stage IIIC1grade 1 endometrioid endometrial adenocarcinoma  The patient tolerated her vaginal brachytherapy well.  She has side effects as above with chemotherapy  Plan: The patient is scheduled to follow-up with Dr. Alvy Bimler on 12/02/2019. She will follow-up with radiation oncology in 5 months.  She will see Dr. Berline Lopes in 2 months.  Today the patient was given a vaginal dilator and instructions on its  use.  Total time spent in this encounter was 15 minutes which included reviewing the patient's most recent follow-up with Dr. Alvy Bimler, chemotherapy, physical examination, and documentation.  ___________________________________   [name], PhD, MD  This document serves as a record of services personally performed by Gery Pray, MD. It was created on his behalf by Clerance Lav, a trained medical scribe. The creation of this record is based on the scribe's personal observations and the provider's statements to them. This document has been checked and approved by the attending provider.

## 2019-11-12 NOTE — Telephone Encounter (Signed)
Called patient to inform of fu appt. with Dr. Berline Lopes on 12-30-19 - arrival time - 1:15 pm, spoke with patient and she is aware of this appt.

## 2019-11-12 NOTE — Progress Notes (Signed)
Patient here for a 1 month f/u visit. She reports having low platelets and feels very tired and her joints ache continuously. She would like a script for the discomfort. Patient denies vaginal bleeding, pain or dysuria. Patient feels very weak and short of breath.  BP (!) 144/62 (BP Location: Left Arm, Patient Position: Sitting, Cuff Size: Large)   Pulse 92   Temp 98.7 F (37.1 C)   Resp 20   Wt (!) 240 lb 9.6 oz (109.1 kg)   SpO2 98%   BMI 42.62 kg/m   Wt Readings from Last 3 Encounters:  11/12/19 (!) 240 lb 9.6 oz (109.1 kg)  11/02/19 240 lb 12.8 oz (109.2 kg)  10/12/19 239 lb 9.6 oz (108.7 kg)

## 2019-11-19 DIAGNOSIS — M170 Bilateral primary osteoarthritis of knee: Secondary | ICD-10-CM | POA: Diagnosis not present

## 2019-11-26 DIAGNOSIS — M179 Osteoarthritis of knee, unspecified: Secondary | ICD-10-CM | POA: Diagnosis not present

## 2019-11-26 DIAGNOSIS — M170 Bilateral primary osteoarthritis of knee: Secondary | ICD-10-CM | POA: Diagnosis not present

## 2019-12-02 ENCOUNTER — Other Ambulatory Visit: Payer: Self-pay

## 2019-12-02 ENCOUNTER — Inpatient Hospital Stay: Payer: Medicare Other

## 2019-12-02 ENCOUNTER — Other Ambulatory Visit: Payer: Self-pay | Admitting: Hematology and Oncology

## 2019-12-02 ENCOUNTER — Encounter: Payer: Self-pay | Admitting: Hematology and Oncology

## 2019-12-02 ENCOUNTER — Inpatient Hospital Stay (HOSPITAL_BASED_OUTPATIENT_CLINIC_OR_DEPARTMENT_OTHER): Payer: Medicare Other | Admitting: Hematology and Oncology

## 2019-12-02 ENCOUNTER — Inpatient Hospital Stay: Payer: Medicare Other | Attending: Hematology and Oncology

## 2019-12-02 VITALS — BP 166/80 | HR 93 | Temp 98.8°F | Resp 18 | Ht 63.0 in | Wt 239.4 lb

## 2019-12-02 VITALS — BP 139/68 | HR 78

## 2019-12-02 DIAGNOSIS — D61818 Other pancytopenia: Secondary | ICD-10-CM

## 2019-12-02 DIAGNOSIS — C541 Malignant neoplasm of endometrium: Secondary | ICD-10-CM | POA: Diagnosis not present

## 2019-12-02 DIAGNOSIS — Z5111 Encounter for antineoplastic chemotherapy: Secondary | ICD-10-CM | POA: Diagnosis not present

## 2019-12-02 DIAGNOSIS — G629 Polyneuropathy, unspecified: Secondary | ICD-10-CM | POA: Diagnosis not present

## 2019-12-02 DIAGNOSIS — N183 Chronic kidney disease, stage 3 unspecified: Secondary | ICD-10-CM

## 2019-12-02 LAB — CBC WITH DIFFERENTIAL (CANCER CENTER ONLY)
Abs Immature Granulocytes: 0.07 10*3/uL (ref 0.00–0.07)
Basophils Absolute: 0 10*3/uL (ref 0.0–0.1)
Basophils Relative: 0 %
Eosinophils Absolute: 0 10*3/uL (ref 0.0–0.5)
Eosinophils Relative: 0 %
HCT: 27.8 % — ABNORMAL LOW (ref 36.0–46.0)
Hemoglobin: 9.1 g/dL — ABNORMAL LOW (ref 12.0–15.0)
Immature Granulocytes: 1 %
Lymphocytes Relative: 9 %
Lymphs Abs: 0.4 10*3/uL — ABNORMAL LOW (ref 0.7–4.0)
MCH: 33.8 pg (ref 26.0–34.0)
MCHC: 32.7 g/dL (ref 30.0–36.0)
MCV: 103.3 fL — ABNORMAL HIGH (ref 80.0–100.0)
Monocytes Absolute: 0.2 10*3/uL (ref 0.1–1.0)
Monocytes Relative: 3 %
Neutro Abs: 4.2 10*3/uL (ref 1.7–7.7)
Neutrophils Relative %: 87 %
Platelet Count: 248 10*3/uL (ref 150–400)
RBC: 2.69 MIL/uL — ABNORMAL LOW (ref 3.87–5.11)
RDW: 16.3 % — ABNORMAL HIGH (ref 11.5–15.5)
WBC Count: 4.8 10*3/uL (ref 4.0–10.5)
nRBC: 0 % (ref 0.0–0.2)

## 2019-12-02 LAB — CMP (CANCER CENTER ONLY)
ALT: 12 U/L (ref 0–44)
AST: 14 U/L — ABNORMAL LOW (ref 15–41)
Albumin: 3.7 g/dL (ref 3.5–5.0)
Alkaline Phosphatase: 48 U/L (ref 38–126)
Anion gap: 11 (ref 5–15)
BUN: 19 mg/dL (ref 8–23)
CO2: 25 mmol/L (ref 22–32)
Calcium: 9.9 mg/dL (ref 8.9–10.3)
Chloride: 103 mmol/L (ref 98–111)
Creatinine: 1.56 mg/dL — ABNORMAL HIGH (ref 0.44–1.00)
GFR, Est AFR Am: 38 mL/min — ABNORMAL LOW (ref >60)
GFR, Estimated: 33 mL/min — ABNORMAL LOW (ref >60)
Glucose, Bld: 139 mg/dL — ABNORMAL HIGH (ref 70–99)
Potassium: 3.7 mmol/L (ref 3.5–5.1)
Sodium: 139 mmol/L (ref 135–145)
Total Bilirubin: 0.4 mg/dL (ref 0.3–1.2)
Total Protein: 7 g/dL (ref 6.5–8.1)

## 2019-12-02 MED ORDER — SODIUM CHLORIDE 0.9 % IV SOLN
320.4000 mg | Freq: Once | INTRAVENOUS | Status: AC
  Administered 2019-12-02: 320 mg via INTRAVENOUS
  Filled 2019-12-02: qty 32

## 2019-12-02 MED ORDER — SODIUM CHLORIDE 0.9% FLUSH
10.0000 mL | Freq: Once | INTRAVENOUS | Status: AC
  Administered 2019-12-02: 10 mL
  Filled 2019-12-02: qty 10

## 2019-12-02 MED ORDER — FAMOTIDINE IN NACL 20-0.9 MG/50ML-% IV SOLN
20.0000 mg | Freq: Once | INTRAVENOUS | Status: AC
  Administered 2019-12-02: 20 mg via INTRAVENOUS

## 2019-12-02 MED ORDER — DIPHENHYDRAMINE HCL 50 MG/ML IJ SOLN
50.0000 mg | Freq: Once | INTRAMUSCULAR | Status: AC
  Administered 2019-12-02: 50 mg via INTRAVENOUS

## 2019-12-02 MED ORDER — DIPHENHYDRAMINE HCL 50 MG/ML IJ SOLN
INTRAMUSCULAR | Status: AC
  Filled 2019-12-02: qty 1

## 2019-12-02 MED ORDER — FAMOTIDINE IN NACL 20-0.9 MG/50ML-% IV SOLN
INTRAVENOUS | Status: AC
  Filled 2019-12-02: qty 50

## 2019-12-02 MED ORDER — PALONOSETRON HCL INJECTION 0.25 MG/5ML
0.2500 mg | Freq: Once | INTRAVENOUS | Status: AC
  Administered 2019-12-02: 0.25 mg via INTRAVENOUS

## 2019-12-02 MED ORDER — HEPARIN SOD (PORK) LOCK FLUSH 100 UNIT/ML IV SOLN
500.0000 [IU] | Freq: Once | INTRAVENOUS | Status: AC | PRN
  Administered 2019-12-02: 500 [IU]
  Filled 2019-12-02: qty 5

## 2019-12-02 MED ORDER — SODIUM CHLORIDE 0.9 % IV SOLN
10.0000 mg | Freq: Once | INTRAVENOUS | Status: AC
  Administered 2019-12-02: 10 mg via INTRAVENOUS
  Filled 2019-12-02: qty 10

## 2019-12-02 MED ORDER — SODIUM CHLORIDE 0.9 % IV SOLN
Freq: Once | INTRAVENOUS | Status: AC
  Filled 2019-12-02: qty 250

## 2019-12-02 MED ORDER — PALONOSETRON HCL INJECTION 0.25 MG/5ML
INTRAVENOUS | Status: AC
  Filled 2019-12-02: qty 5

## 2019-12-02 MED ORDER — SODIUM CHLORIDE 0.9 % IV SOLN
52.5000 mg/m2 | Freq: Once | INTRAVENOUS | Status: AC
  Administered 2019-12-02: 114 mg via INTRAVENOUS
  Filled 2019-12-02: qty 19

## 2019-12-02 MED ORDER — SODIUM CHLORIDE 0.9% FLUSH
10.0000 mL | INTRAVENOUS | Status: DC | PRN
  Administered 2019-12-02: 10 mL
  Filled 2019-12-02: qty 10

## 2019-12-02 MED ORDER — SODIUM CHLORIDE 0.9 % IV SOLN
150.0000 mg | Freq: Once | INTRAVENOUS | Status: AC
  Administered 2019-12-02: 150 mg via INTRAVENOUS
  Filled 2019-12-02: qty 150

## 2019-12-02 NOTE — Progress Notes (Signed)
Per Dr. Alvy Bimler, okay to proceed with treatment today with elevated creatinine levels.

## 2019-12-02 NOTE — Patient Instructions (Signed)
[facility] Discharge Instructions for Patients Receiving Chemotherapy  Today you received the following chemotherapy agents: Paclitaxel and Carboplatin  To help prevent nausea and vomiting after your treatment, we encourage you to take your nausea medication as prescribed.    If you develop nausea and vomiting that is not controlled by your nausea medication, call the clinic.   BELOW ARE SYMPTOMS THAT SHOULD BE REPORTED IMMEDIATELY:  *FEVER GREATER THAN 100.5 F  *CHILLS WITH OR WITHOUT FEVER  NAUSEA AND VOMITING THAT IS NOT CONTROLLED WITH YOUR NAUSEA MEDICATION  *UNUSUAL SHORTNESS OF BREATH  *UNUSUAL BRUISING OR BLEEDING  TENDERNESS IN MOUTH AND THROAT WITH OR WITHOUT PRESENCE OF ULCERS  *URINARY PROBLEMS  *BOWEL PROBLEMS  UNUSUAL RASH Items with * indicate a potential emergency and should be followed up as soon as possible.  Feel free to call the clinic should you have any questions or concerns. The clinic phone number is [phone].  Please show the CHEMO ALERT CARD at check-in to the Emergency Department and triage nurse.   

## 2019-12-02 NOTE — Patient Instructions (Signed)

## 2019-12-02 NOTE — Assessment & Plan Note (Signed)
She will complete last cycle of treatment She has excessive delay due to severe pancytopenia which has improved since last time I saw her We will continue treatment with reduced dose therapy I plan to order CT imaging next month for further follow-up If her CT scan looks normal, we will get her port removed

## 2019-12-02 NOTE — Progress Notes (Signed)
[place] OFFICE PROGRESS NOTE  Patient Care Team: [facility], [facility] as PCP - General Awanda Mink, Craige Cotta, RN as Oncology Nurse Navigator (Oncology) Gery Pray, MD as Consulting Physician (Radiation Oncology)  ASSESSMENT & PLAN:  Endometrial cancer [facility]) She will complete last cycle of treatment She has excessive delay due to severe pancytopenia which has improved since last time I saw her We will continue treatment with reduced dose therapy I plan to order CT imaging next month for further follow-up If her CT scan looks normal, we will get her port removed  CKD (chronic kidney disease), stage III She has mild intermittent acute on chronic renal failure We will adjust the dose of carboplatin accordingly  Pancytopenia, acquired ([place]) She has progressive pancytopenia despite multiple dose adjustment We will proceed with similar dose adjustment today She does not need transfusion support today I will also delay her next cycle of treatment   Peripheral polyneuropathy She complain of intermittent severe neuropathy while on treatment.  With recent drastic dose adjustment of Taxol, her neuropathy is still bad  For now, we will continue with treatment with dose adjustment I have checked her recent B12 level and that was okay   Orders Placed This Encounter  Procedures  . CT ABDOMEN PELVIS W CONTRAST    Standing Status:   Future    Standing Expiration Date:   12/01/2020    Order Specific Question:   If indicated for the ordered procedure, I authorize the administration of contrast media per Radiology protocol    Answer:   Yes    Order Specific Question:   Preferred imaging location?    Answer:   [facility] [place]    Order Specific Question:   Radiology Contrast Protocol - do NOT remove file path    Answer:   [email or web address]    All questions were answered. The patient knows to call the clinic with any problems, questions or  concerns. The total time spent in the appointment was 25 minutes encounter with patients including review of chart and various tests results, discussions about plan of care and coordination of care plan   Heath Lark, MD 12/02/2019 11:46 AM  INTERVAL HISTORY: Please see below for problem oriented charting. She returns for further follow-up She continues to have significant peripheral neuropathy Denies recent nausea or constipation No recent infection, fever or chills She complained of fatigue  SUMMARY OF ONCOLOGIC HISTORY: Oncology History Overview Note  Endometrioid, MSI High, MLH1 promoter hypermethylation present   Endometrial cancer ([place])  05/18/2019 Initial Biopsy   EMB: Complex atypical hyperplasia, cannot rule out microscopic focus of FIGO grade 1 carcinoma   05/18/2019 Initial Diagnosis   Presented in February 2021 with postmenopausal bleeding.  She was started on Provera and underwent endometrial biopsy showing complex atypical hyperplasia with a possible focus suspicious for grade 1 endometrial cancer.   05/21/2019 Imaging   Pelvic ultrasound at [facility]: Anteverted uterus measuring 8.2 cm in greatest dimension.  Endometrial lining measures 1.73 cm and is thickened.  Left ovary not visualized, right ovary normal in appearance.   06/30/2019 Surgery   Robotic-assisted laparoscopic total hysterectomy with bilateral salpingoophorectomy, bilateral pelvic LND  Frozen section showed endometrial cancer with more than 50% myometrial invasion.  Patient did not map to a sentinel lymph node bilaterally.   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  Tumor measures 3.5 cm and invades focally into the serosa.  No involvement of the cervix or  bilateral adnexa.  1 of 20 lymph nodes positive for metastatic carcinoma, focus measures 1 cm without evidence of extracapsular extension.  FINAL MICROSCOPIC DIAGNOSIS: A. UTERUS, CERVIX, BILATERAL  FALLOPIAN TUBES AND OVARIES, HYSTERECTOMY WITH SALPINGECTOMY: - Endometrioid adenocarcinoma, 3.5 cm, FIGO grade 1 - Carcinoma invades through the entire myometrium and focally into the serosal surface - Benign unremarkable cervix - Benign unremarkable bilateral fallopian tubes and left ovary - Benign serous inclusion cyst of right ovary - See oncology table B. LYMPH NODE, RIGHT PELVIC, BIOPSY: - Metastatic carcinoma to one of twelve lymph nodes (1/12) - Focus of metastatic carcinoma measures 1.0 cm, without evidence of extracapsular extension C. LYMPH NODE, LEFT PELVIC, BIOPSY: - Eight benign lymph nodes (0/8) ONCOLOGY TABLE: UTERUS, CARCINOMA OR CARCINOSARCOMA Procedure: Total hysterectomy and bilateral salpingo-oophorectomy Histologic type: Endometrioid adenocarcinoma Histologic Grade: FIGO grade 1 Myometrial invasion: Depth of invasion: 30 mm Myometrial thickness: 30 mm Uterine Serosa Involvement: Present, focal Cervical stromal involvement: Not identified Extent of involvement of other organs: Not identified Lymphovascular invasion: Not identified Regional Lymph Nodes: Examined: 0 Sentinel 20 Non-sentinel 20 Total Lymph nodes with metastasis: 1 Isolated tumor cells (<0.2 mm): 0 Micrometastasis: (>0.2 mm and < 2.0 mm): 0 Macrometastasis: (>2.0 mm): 1 Extracapsular extension: Not identified Representative Tumor Block: A6 MMR / MSI testing: Will be ordered Pathologic Stage Classification (pTNM, AJCC 8th edition): pT3, pN1a Comments: None Mismatch Repair Protein (IHC) SUMMARY INTERPRETATION: ABNORMAL 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 The loss of expression may be secondary to promoter hyper-methylation, gene mutation or other genetic event. BRAF mutation testing and/or MLH1 methylation testing is indicated. The presence of a BRAF mutation and/or MLH1 hypermethylation is indicative of a sporadic-type tumor. The absence of either BRAF mutation  and/or presence of normal methylation indicate the possible presence of a hereditary germline mutation (e.g.Lynch syndrome) and referral to genetic counseling is warranted. It is recommended that the loss of protein expression be correlated with molecular based MSI testing   06/30/2019 Cancer Staging   Staging form: Corpus Uteri - Carcinoma and Carcinosarcoma, AJCC 8th Edition - Clinical stage from 06/30/2019: FIGO Stage IIIC1 (cT3, cN1a, cM0) - Signed by Lafonda Mosses, MD on 07/09/2019   07/16/2019 Imaging   1. No evidence of metastatic disease in the chest, abdomen or pelvis. 2. Status post hysterectomy. Small volume simple ascites. Small amount of ill-defined fluid in the pelvic sidewalls, compatible with recent surgery. 3. Small umbilical hernia containing fat and trace ascitic fluid. 4. Chronic findings include: Aortic Atherosclerosis (ICD10-I70.0). Small hiatal hernia. Mild sigmoid diverticulosis   07/29/2019 Procedure   Ultrasound and fluoroscopically guided right internal jugular single lumen power port catheter insertion. Tip in the SVC/RA junction. Catheter ready for use.   08/03/2019 -  Chemotherapy   The patient had carboplatin and taxol for chemotherapy treatment.       REVIEW OF SYSTEMS:   Constitutional: Denies fevers, chills or abnormal weight loss Eyes: Denies blurriness of vision Ears, nose, mouth, throat, and face: Denies mucositis or sore throat Respiratory: Denies cough, dyspnea or wheezes Cardiovascular: Denies palpitation, chest discomfort or lower extremity swelling Gastrointestinal:  Denies nausea, heartburn or change in bowel habits Skin: Denies abnormal skin rashes Lymphatics: Denies new lymphadenopathy or easy bruising Behavioral/Psych: Mood is stable, no new changes  All other systems were reviewed with the patient and are negative.  I have reviewed the past medical history, past surgical history, social history and family history with the patient and they  are unchanged from previous note.  ALLERGIES:  is allergic to amoxicillin and latex.  MEDICATIONS:  Current Outpatient Medications  Medication Sig Dispense Refill  . AMITIZA 24 MCG capsule Take 48 mcg by mouth every evening.     . B Complex Vitamins (B COMPLEX PO) Take 1 Dose by mouth daily. Liquid b complex    . benazepril-hydrochlorthiazide (LOTENSIN HCT) 20-25 MG tablet Take 1 tablet by mouth every evening.     . diphenhydrAMINE (BENADRYL) 25 MG tablet Take 50 mg by mouth daily as needed for allergies.    Marland Kitchen gabapentin (NEURONTIN) 600 MG tablet Take 600-1,200 mg by mouth See admin instructions. Take 1200 mg in the morning, 600 mg in the afternoon, and 600 mg at night    . Melatonin 5 MG CAPS Take 5 mg by mouth at bedtime.    . meloxicam (MOBIC) 15 MG tablet Take 15 mg by mouth daily in the afternoon.     . methocarbamol (ROBAXIN) 500 MG tablet Take 1,000 mg by mouth 2 (two) times daily as needed.    . Oxymetazoline HCl (VICKS SINEX 12 HOUR NA) Place 1 spray into the nose 2 (two) times daily as needed (congestion).    Marland Kitchen tiZANidine (ZANAFLEX) 4 MG tablet Take 4 mg by mouth at bedtime.    . topiramate (TOPAMAX) 25 MG tablet Take 75 mg by mouth at bedtime.     No current facility-administered medications for this visit.   Facility-Administered Medications Ordered in Other Visits  Medication Dose Route Frequency Provider Last Rate Last Admin  . CARBOplatin (PARAPLATIN) 320 mg in sodium chloride 0.9 % 250 mL chemo infusion  320 mg Intravenous Once Alvy Bimler, Violet Cart, MD      . dexamethasone (DECADRON) 10 mg in sodium chloride 0.9 % 50 mL IVPB  10 mg Intravenous Once Heath Lark, MD 204 mL/hr at 12/02/19 1139 Rate Verify at 12/02/19 1139  . fosaprepitant (EMEND) 150 mg in sodium chloride 0.9 % 145 mL IVPB  150 mg Intravenous Once Alvy Bimler, Cheyan Frees, MD      . heparin lock flush 100 unit/mL  500 Units Intracatheter Once PRN Alvy Bimler, Nga Rabon, MD      . PACLitaxel (TAXOL) 114 mg in sodium chloride 0.9 % 250 mL chemo  infusion (> 90m/m2)  52.5 mg/m2 (Treatment Plan Recorded) Intravenous Once Rasaan Brotherton, MD      . sodium chloride flush (NS) 0.9 % injection 10 mL  10 mL Intracatheter PRN GAlvy Bimler Lashuna Tamashiro, MD        PHYSICAL EXAMINATION: ECOG PERFORMANCE STATUS: 1 - Symptomatic but completely ambulatory  Vitals:   12/02/19 1031  BP: (!) 166/80  Pulse: 93  Resp: 18  Temp: 98.8 F (37.1 C)  SpO2: 98%   Filed Weights   12/02/19 1031  Weight: 239 lb 6.4 oz (108.6 kg)    GENERAL:alert, no distress and comfortable SKIN: skin color, texture, turgor are normal, no rashes or significant lesions EYES: normal, Conjunctiva are pink and non-injected, sclera clear OROPHARYNX:no exudate, no erythema and lips, buccal mucosa, and tongue normal  NECK: supple, thyroid normal size, non-tender, without nodularity LYMPH:  no palpable lymphadenopathy in the cervical, axillary or inguinal LUNGS: clear to auscultation and percussion with normal breathing effort HEART: regular rate & rhythm and no murmurs and no lower extremity edema ABDOMEN:abdomen soft, non-tender and normal bowel sounds Musculoskeletal:no cyanosis of digits and no clubbing  NEURO: alert & oriented x 3 with fluent speech, no focal motor/sensory deficits  LABORATORY DATA:  I have  reviewed the data as listed    Component Value Date/Time   NA 139 12/02/2019 1010   K 3.7 12/02/2019 1010   CL 103 12/02/2019 1010   CO2 25 12/02/2019 1010   GLUCOSE 139 (H) 12/02/2019 1010   BUN 19 12/02/2019 1010   CREATININE 1.56 (H) 12/02/2019 1010   CALCIUM 9.9 12/02/2019 1010   PROT 7.0 12/02/2019 1010   ALBUMIN 3.7 12/02/2019 1010   AST 14 (L) 12/02/2019 1010   ALT 12 12/02/2019 1010   ALKPHOS 48 12/02/2019 1010   BILITOT 0.4 12/02/2019 1010   GFRNONAA 33 (L) 12/02/2019 1010   GFRAA 38 (L) 12/02/2019 1010    No results found for: SPEP, UPEP  Lab Results  Component Value Date   WBC 4.8 12/02/2019   NEUTROABS 4.2 12/02/2019   HGB 9.1 (L) 12/02/2019    HCT 27.8 (L) 12/02/2019   MCV 103.3 (H) 12/02/2019   PLT 248 12/02/2019      Chemistry      Component Value Date/Time   NA 139 12/02/2019 1010   K 3.7 12/02/2019 1010   CL 103 12/02/2019 1010   CO2 25 12/02/2019 1010   BUN 19 12/02/2019 1010   CREATININE 1.56 (H) 12/02/2019 1010      Component Value Date/Time   CALCIUM 9.9 12/02/2019 1010   ALKPHOS 48 12/02/2019 1010   AST 14 (L) 12/02/2019 1010   ALT 12 12/02/2019 1010   BILITOT 0.4 12/02/2019 1010

## 2019-12-02 NOTE — Assessment & Plan Note (Signed)
She has progressive pancytopenia despite multiple dose adjustment We will proceed with similar dose adjustment today She does not need transfusion support today I will also delay her next cycle of treatment

## 2019-12-02 NOTE — Assessment & Plan Note (Signed)
She has mild intermittent acute on chronic renal failure We will adjust the dose of carboplatin accordingly

## 2019-12-02 NOTE — Assessment & Plan Note (Addendum)
She complain of intermittent severe neuropathy while on treatment.  With recent drastic dose adjustment of Taxol, her neuropathy is still bad  For now, we will continue with treatment with dose adjustment I have checked her recent B12 level and that was okay

## 2019-12-03 ENCOUNTER — Telehealth: Payer: Self-pay | Admitting: Oncology

## 2019-12-03 NOTE — Telephone Encounter (Signed)
[employer] and discussed genetic counseling.  She is interested so an appointment was scheduled genetic for 12/16/19 at 2:00.  She would like it to be a phone visit if possible.

## 2019-12-16 ENCOUNTER — Inpatient Hospital Stay: Payer: Medicare Other | Admitting: Genetic Counselor

## 2019-12-28 ENCOUNTER — Other Ambulatory Visit: Payer: Self-pay

## 2019-12-28 ENCOUNTER — Ambulatory Visit (HOSPITAL_COMMUNITY)
Admission: RE | Admit: 2019-12-28 | Discharge: 2019-12-28 | Disposition: A | Discharge status: Home / Self Care | Payer: Medicare Other | Source: Ambulatory Visit | Attending: Hematology and Oncology | Admitting: Hematology and Oncology

## 2019-12-28 DIAGNOSIS — C55 Malignant neoplasm of uterus, part unspecified: Secondary | ICD-10-CM | POA: Diagnosis not present

## 2019-12-28 DIAGNOSIS — K449 Diaphragmatic hernia without obstruction or gangrene: Secondary | ICD-10-CM | POA: Diagnosis not present

## 2019-12-28 DIAGNOSIS — C541 Malignant neoplasm of endometrium: Secondary | ICD-10-CM

## 2019-12-28 DIAGNOSIS — K439 Ventral hernia without obstruction or gangrene: Secondary | ICD-10-CM | POA: Diagnosis not present

## 2019-12-28 MED ORDER — HEPARIN SOD (PORK) LOCK FLUSH 100 UNIT/ML IV SOLN
INTRAVENOUS | Status: AC
  Administered 2019-12-28: 500 [IU] via INTRAVENOUS
  Filled 2019-12-28: qty 5

## 2019-12-28 MED ORDER — HEPARIN SOD (PORK) LOCK FLUSH 100 UNIT/ML IV SOLN: 500.0000 [IU] | Freq: Once | INTRAVENOUS | Status: AC

## 2019-12-28 MED ORDER — IOHEXOL 300 MG/ML  SOLN
100.0000 mL | Freq: Once | INTRAMUSCULAR | Status: AC | PRN
  Administered 2019-12-28: 80 mL via INTRAVENOUS

## 2019-12-30 ENCOUNTER — Encounter: Payer: Self-pay | Admitting: Gynecologic Oncology

## 2019-12-30 ENCOUNTER — Other Ambulatory Visit: Payer: Self-pay

## 2019-12-30 ENCOUNTER — Inpatient Hospital Stay: Payer: Medicare Other | Attending: Hematology and Oncology | Admitting: Gynecologic Oncology

## 2019-12-30 ENCOUNTER — Inpatient Hospital Stay: Payer: Medicare Other

## 2019-12-30 ENCOUNTER — Inpatient Hospital Stay (HOSPITAL_BASED_OUTPATIENT_CLINIC_OR_DEPARTMENT_OTHER): Payer: Medicare Other | Admitting: Hematology and Oncology

## 2019-12-30 ENCOUNTER — Encounter: Payer: Self-pay | Admitting: Hematology and Oncology

## 2019-12-30 VITALS — BP 155/82 | HR 86 | Temp 99.0°F | Resp 18 | Ht 63.0 in | Wt 237.0 lb

## 2019-12-30 DIAGNOSIS — D6481 Anemia due to antineoplastic chemotherapy: Secondary | ICD-10-CM | POA: Diagnosis not present

## 2019-12-30 DIAGNOSIS — T451X5A Adverse effect of antineoplastic and immunosuppressive drugs, initial encounter: Secondary | ICD-10-CM | POA: Diagnosis not present

## 2019-12-30 DIAGNOSIS — I10 Essential (primary) hypertension: Secondary | ICD-10-CM | POA: Diagnosis not present

## 2019-12-30 DIAGNOSIS — C541 Malignant neoplasm of endometrium: Secondary | ICD-10-CM | POA: Insufficient documentation

## 2019-12-30 DIAGNOSIS — Z9221 Personal history of antineoplastic chemotherapy: Secondary | ICD-10-CM | POA: Insufficient documentation

## 2019-12-30 DIAGNOSIS — C775 Secondary and unspecified malignant neoplasm of intrapelvic lymph nodes: Secondary | ICD-10-CM | POA: Diagnosis not present

## 2019-12-30 DIAGNOSIS — Z6841 Body Mass Index (BMI) 40.0 and over, adult: Secondary | ICD-10-CM

## 2019-12-30 DIAGNOSIS — N183 Chronic kidney disease, stage 3 unspecified: Secondary | ICD-10-CM | POA: Insufficient documentation

## 2019-12-30 LAB — CBC WITH DIFFERENTIAL (CANCER CENTER ONLY)
Abs Immature Granulocytes: 0.03 10*3/uL (ref 0.00–0.07)
Basophils Absolute: 0.1 10*3/uL (ref 0.0–0.1)
Basophils Relative: 1 %
Eosinophils Absolute: 0.2 10*3/uL (ref 0.0–0.5)
Eosinophils Relative: 4 %
HCT: 29.3 % — ABNORMAL LOW (ref 36.0–46.0)
Hemoglobin: 9.6 g/dL — ABNORMAL LOW (ref 12.0–15.0)
Immature Granulocytes: 1 %
Lymphocytes Relative: 16 %
Lymphs Abs: 0.9 10*3/uL (ref 0.7–4.0)
MCH: 33.1 pg (ref 26.0–34.0)
MCHC: 32.8 g/dL (ref 30.0–36.0)
MCV: 101 fL — ABNORMAL HIGH (ref 80.0–100.0)
Monocytes Absolute: 0.6 10*3/uL (ref 0.1–1.0)
Monocytes Relative: 11 %
Neutro Abs: 3.9 10*3/uL (ref 1.7–7.7)
Neutrophils Relative %: 67 %
Platelet Count: 231 10*3/uL (ref 150–400)
RBC: 2.9 MIL/uL — ABNORMAL LOW (ref 3.87–5.11)
RDW: 13.6 % (ref 11.5–15.5)
WBC Count: 5.7 10*3/uL (ref 4.0–10.5)
nRBC: 0 % (ref 0.0–0.2)

## 2019-12-30 LAB — CMP (CANCER CENTER ONLY)
ALT: 14 U/L (ref 0–44)
AST: 13 U/L — ABNORMAL LOW (ref 15–41)
Albumin: 3.7 g/dL (ref 3.5–5.0)
Alkaline Phosphatase: 47 U/L (ref 38–126)
Anion gap: 10 (ref 5–15)
BUN: 14 mg/dL (ref 8–23)
CO2: 29 mmol/L (ref 22–32)
Calcium: 9.5 mg/dL (ref 8.9–10.3)
Chloride: 102 mmol/L (ref 98–111)
Creatinine: 1.62 mg/dL — ABNORMAL HIGH (ref 0.44–1.00)
GFR, Est AFR Am: 36 mL/min — ABNORMAL LOW (ref >60)
GFR, Estimated: 31 mL/min — ABNORMAL LOW (ref >60)
Glucose, Bld: 129 mg/dL — ABNORMAL HIGH (ref 70–99)
Potassium: 3.2 mmol/L — ABNORMAL LOW (ref 3.5–5.1)
Sodium: 141 mmol/L (ref 135–145)
Total Bilirubin: 0.4 mg/dL (ref 0.3–1.2)
Total Protein: 6.8 g/dL (ref 6.5–8.1)

## 2019-12-30 MED ORDER — HEPARIN SOD (PORK) LOCK FLUSH 100 UNIT/ML IV SOLN
500.0000 [IU] | Freq: Once | INTRAVENOUS | Status: AC
  Administered 2019-12-30: 500 [IU]
  Filled 2019-12-30: qty 5

## 2019-12-30 MED ORDER — SODIUM CHLORIDE 0.9% FLUSH
10.0000 mL | Freq: Once | INTRAVENOUS | Status: AC
  Administered 2019-12-30: 10 mL
  Filled 2019-12-30: qty 10

## 2019-12-30 NOTE — Assessment & Plan Note (Signed)
She has mild intermittent acute on chronic renal failure She will continue hydration and risk factor modification

## 2019-12-30 NOTE — Assessment & Plan Note (Signed)
I have reviewed multiple CT imaging with the patient So far, there is no conclusive evidence of residual disease The abnormal lymph node described on the CT imaging is likely benign We will observe closely repeat imaging study in the future We discussed port maintenance I recommend she keeps her port for 2 years after treatment I will see her back in a year for further follow-up

## 2019-12-30 NOTE — Progress Notes (Signed)
Gynecologic Oncology Return Clinic Visit  12/30/19  Reason for Visit: Visit after completion of adjuvant therapy in the setting of advanced stage endometrial cancer  Treatment History: Oncology History Overview Note  Endometrioid, MSI High, MLH1 promoter hypermethylation present   Endometrial cancer ([place])  05/18/2019 Initial Biopsy   EMB: Complex atypical hyperplasia, cannot rule out microscopic focus of FIGO grade 1 carcinoma   05/18/2019 Initial Diagnosis   Presented in February 2021 with postmenopausal bleeding.  She was started on Provera and underwent endometrial biopsy showing complex atypical hyperplasia with a possible focus suspicious for grade 1 endometrial cancer.   05/21/2019 Imaging   Pelvic ultrasound at [facility]: Anteverted uterus measuring 8.2 cm in greatest dimension.  Endometrial lining measures 1.73 cm and is thickened.  Left ovary not visualized, right ovary normal in appearance.   06/30/2019 Surgery   Robotic-assisted laparoscopic total hysterectomy with bilateral salpingoophorectomy, bilateral pelvic LND  Frozen section showed endometrial cancer with more than 50% myometrial invasion.  Patient did not map to a sentinel lymph node bilaterally.   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  Tumor measures 3.5 cm and invades focally into the serosa.  No involvement of the cervix or bilateral adnexa.  1 of 20 lymph nodes positive for metastatic carcinoma, focus measures 1 cm without evidence of extracapsular extension.  FINAL MICROSCOPIC DIAGNOSIS: A. UTERUS, CERVIX, BILATERAL FALLOPIAN TUBES AND OVARIES, HYSTERECTOMY WITH SALPINGECTOMY: - Endometrioid adenocarcinoma, 3.5 cm, FIGO grade 1 - Carcinoma invades through the entire myometrium and focally into the serosal surface - Benign unremarkable cervix - Benign unremarkable bilateral fallopian tubes and left ovary - Benign serous inclusion cyst of right ovary - See  oncology table B. LYMPH NODE, RIGHT PELVIC, BIOPSY: - Metastatic carcinoma to one of twelve lymph nodes (1/12) - Focus of metastatic carcinoma measures 1.0 cm, without evidence of extracapsular extension C. LYMPH NODE, LEFT PELVIC, BIOPSY: - Eight benign lymph nodes (0/8) ONCOLOGY TABLE: UTERUS, CARCINOMA OR CARCINOSARCOMA Procedure: Total hysterectomy and bilateral salpingo-oophorectomy Histologic type: Endometrioid adenocarcinoma Histologic Grade: FIGO grade 1 Myometrial invasion: Depth of invasion: 30 mm Myometrial thickness: 30 mm Uterine Serosa Involvement: Present, focal Cervical stromal involvement: Not identified Extent of involvement of other organs: Not identified Lymphovascular invasion: Not identified Regional Lymph Nodes: Examined: 0 Sentinel 20 Non-sentinel 20 Total Lymph nodes with metastasis: 1 Isolated tumor cells (<0.2 mm): 0 Micrometastasis: (>0.2 mm and < 2.0 mm): 0 Macrometastasis: (>2.0 mm): 1 Extracapsular extension: Not identified Representative Tumor Block: A6 MMR / MSI testing: Will be ordered Pathologic Stage Classification (pTNM, AJCC 8th edition): pT3, pN1a Comments: None Mismatch Repair Protein (IHC) SUMMARY INTERPRETATION: ABNORMAL 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 The loss of expression may be secondary to promoter hyper-methylation, gene mutation or other genetic event. BRAF mutation testing and/or MLH1 methylation testing is indicated. The presence of a BRAF mutation and/or MLH1 hypermethylation is indicative of a sporadic-type tumor. The absence of either BRAF mutation and/or presence of normal methylation indicate the possible presence of a hereditary germline mutation (e.g.Lynch syndrome) and referral to genetic counseling is warranted. It is recommended that the loss of protein expression be correlated with molecular based MSI testing   06/30/2019 Cancer Staging   Staging form: Corpus Uteri - Carcinoma and  Carcinosarcoma, AJCC 8th Edition - Clinical stage from 06/30/2019: FIGO Stage IIIC1 (cT3, cN1a, cM0) - Signed by Lafonda Mosses, MD on 07/09/2019   07/16/2019 Imaging  1. No evidence of metastatic disease in the chest, abdomen or pelvis. 2. Status post hysterectomy. Small volume simple ascites. Small amount of ill-defined fluid in the pelvic sidewalls, compatible with recent surgery. 3. Small umbilical hernia containing fat and trace ascitic fluid. 4. Chronic findings include: Aortic Atherosclerosis (ICD10-I70.0). Small hiatal hernia. Mild sigmoid diverticulosis   07/29/2019 Procedure   Ultrasound and fluoroscopically guided right internal jugular single lumen power port catheter insertion. Tip in the SVC/RA junction. Catheter ready for use.   08/03/2019 - 12/02/2019 Chemotherapy   The patient had carboplatin and taxol for chemotherapy treatment.     12/29/2019 Imaging   1. Enlargement of a left external iliac node, borderline sized. This could be reactive or represent early/isolated nodal metastasis. 2. Otherwise, no evidence of metastatic disease in the abdomen or pelvis. 3. Relative hyperattenuation surrounding the gallbladder is favored to be related to hepatic steatosis and sparing. Recommend attention on follow-up. 4. Degraded evaluation of the pelvis, secondary to beam hardening artifact from left hip arthroplasty. 5. Small hiatal hernia. 6. Decrease in trace pelvic fluid. 7. Aortic Atherosclerosis (ICD10-I70.0).     Interval History: Patient presents today approximately 1 month after finishing adjuvant carboplatin and paclitaxel as well as vaginal brachytherapy for stage IIIC1 endometrial adenocarcinoma.  She received 3 Gy of HDR for her vaginal brachytherapy completed between 5/24 and 6/30.  During chemotherapy, she had delays related to pancytopenia and ultimately had to have a dose reduction.  She presents today overall doing well.  She has gained a lot of her energy back and  her appetite has been excellent since finishing treatment.  She denies any nausea or emesis.  Her bowel function is mostly back to normal.  She denies any urinary symptoms.  She has had very minimal vaginal bleeding that seem to be associated most with her radiation.  She is using a dilator a couple of times a week since completing vaginal brachytherapy.  Past Medical/Surgical History: Past Medical History:  Diagnosis Date  . Cancer ([place]) 06/2019  . Cataract   . Complex endometrial hyperplasia with atypia   . Dyspnea   . GERD (gastroesophageal reflux disease)   . Heart murmur    as a child  . Hypertension   . Obesity   . Pneumonia 2018  . Post-menopausal bleeding 05/18/2019    Past Surgical History:  Procedure Laterality Date  . BIOPSY ENDOMETRIAL    . CATARACT EXTRACTION  03/2019  . ESOPHAGOGASTRODUODENOSCOPY    . IR IMAGING GUIDED PORT INSERTION  07/29/2019  . ROBOTIC ASSISTED TOTAL HYSTERECTOMY WITH BILATERAL SALPINGO OOPHERECTOMY Bilateral 06/30/2019   Procedure: XI ROBOTIC ASSISTED TOTAL HYSTERECTOMY WITH BILATERAL SALPINGO OOPHORECTOMY, BILATERAL LYMPH NODE DISSECTION;  Surgeon: Lafonda Mosses, MD;  Location: [facility];  Service: Gynecology;  Laterality: Bilateral;  . SENTINEL NODE BIOPSY N/A 06/30/2019   Procedure: SENTINEL LYMPH NODE BIOPSY;  Surgeon: Lafonda Mosses, MD;  Location: [facility];  Service: Gynecology;  Laterality: N/A;  . TONSILLECTOMY    . TOTAL HIP ARTHROPLASTY Left   . WISDOM TOOTH EXTRACTION      Family History  Problem Relation Age of Onset  . Colon cancer Mother 37       treated with surgery only  . Lung cancer Mother   . CVA Father   . Endometrial cancer Neg Hx   . Ovarian cancer Neg Hx   . Breast cancer Neg Hx     Social History   Socioeconomic History  . Marital status: Divorced  Spouse name: Not on file  . Number of children: Not on file  . Years of education: Not on file  . Highest education level: Not on file  Occupational  History  . Not on file  Tobacco Use  . Smoking status: Never Smoker  . Smokeless tobacco: Never Used  Vaping Use  . Vaping Use: Never used  Substance and Sexual Activity  . Alcohol use: No  . Drug use: Never  . Sexual activity: Not Currently  Other Topics Concern  . Not on file  Social History Narrative  . Not on file   Social Determinants of Health   Financial Resource Strain:   . Difficulty of Paying Living Expenses: Not on file  Food Insecurity:   . Worried About Charity fundraiser in the Last Year: Not on file  . Ran Out of Food in the Last Year: Not on file  Transportation Needs:   . Lack of Transportation (Medical): Not on file  . Lack of Transportation (Non-Medical): Not on file  Physical Activity:   . Days of Exercise per Week: Not on file  . Minutes of Exercise per Session: Not on file  Stress:   . Feeling of Stress : Not on file  Social Connections:   . Frequency of Communication with Friends and Family: Not on file  . Frequency of Social Gatherings with Friends and Family: Not on file  . Attends Religious Services: Not on file  . Active Member of Clubs or Organizations: Not on file  . Attends Archivist Meetings: Not on file  . Marital Status: Not on file    Current Medications:  Current Outpatient Medications:  .  AMITIZA 24 MCG capsule, Take 48 mcg by mouth every evening. , Disp: , Rfl:  .  B Complex Vitamins (B COMPLEX PO), Take 1 Dose by mouth daily. Liquid b complex, Disp: , Rfl:  .  benazepril-hydrochlorthiazide (LOTENSIN HCT) 20-25 MG tablet, Take 1 tablet by mouth every evening. , Disp: , Rfl:  .  diphenhydrAMINE (BENADRYL) 25 MG tablet, Take 50 mg by mouth daily as needed for allergies., Disp: , Rfl:  .  gabapentin (NEURONTIN) 600 MG tablet, Take 600-1,200 mg by mouth See admin instructions. Take 1200 mg in the morning, 600 mg in the afternoon, and 600 mg at night, Disp: , Rfl:  .  Melatonin 5 MG CAPS, Take 5 mg by mouth at bedtime.,  Disp: , Rfl:  .  meloxicam (MOBIC) 15 MG tablet, Take 15 mg by mouth daily in the afternoon. , Disp: , Rfl:  .  methocarbamol (ROBAXIN) 500 MG tablet, Take 1,000 mg by mouth 2 (two) times daily as needed., Disp: , Rfl:  .  Oxymetazoline HCl (VICKS SINEX 12 HOUR NA), Place 1 spray into the nose 2 (two) times daily as needed (congestion)., Disp: , Rfl:  .  tiZANidine (ZANAFLEX) 4 MG tablet, Take 4 mg by mouth at bedtime., Disp: , Rfl:  .  topiramate (TOPAMAX) 25 MG tablet, Take 75 mg by mouth at bedtime., Disp: , Rfl:   Review of Systems: Denies appetite changes, fevers, chills, unexplained weight changes. Denies hearing loss, neck lumps or masses, mouth sores, ringing in ears or voice changes. Denies cough or wheezing.  Denies shortness of breath. Denies chest pain or palpitations. Denies leg swelling. Denies abdominal distention, pain, blood in stools, constipation, diarrhea, nausea, vomiting, or early satiety. Denies pain with intercourse, dysuria, frequency, hematuria or incontinence. Denies hot flashes, pelvic pain, vaginal bleeding  or vaginal discharge.   Denies joint pain, back pain or muscle pain/cramps. Denies itching, rash, or wounds. Denies dizziness, headaches, numbness or seizures. Denies swollen lymph nodes or glands, denies easy bruising or bleeding. Denies anxiety, depression, confusion, or decreased concentration.  Physical Exam: BP (!) 155/82 (BP Location: Left Arm, Patient Position: Sitting)   Pulse 86   Temp 99 F (37.2 C) (Tympanic)   Resp 18   Ht 5' 3"  (1.6 m)   Wt 237 lb (107.5 kg)   SpO2 100% Comment: ra  BMI 41.98 kg/m  General: Alert, oriented, no acute distress. HEENT: Normocephalic, atraumatic, sclera anicteric. Chest: Unlabored breathing on room air. Abdomen: Obese, soft, nontender.  Normoactive bowel sounds.  No masses or hepatosplenomegaly appreciated.  Well-healed incisions. Extremities: Grossly normal range of motion.  Warm, well perfused.  Trace  edema bilaterally. Skin: No rashes or lesions noted. Lymphatics: No cervical, supraclavicular, or inguinal adenopathy. GU: Normal appearing external genitalia without erythema, excoriation, or lesions.  Speculum exam reveals cuff intact, some moderate atrophy and changes related to radiation.  No lesions, bleeding or discharge noted.  Bimanual exam reveals cuff intact, no nodularity.  Rectovaginal exam confirms these findings.  Laboratory & Radiologic Studies: CT A/P on 9/13: IMPRESSION: 1. Enlargement of a left external iliac node, borderline sized. This could be reactive or represent early/isolated nodal metastasis. 2. Otherwise, no evidence of metastatic disease in the abdomen or pelvis. 3. Relative hyperattenuation surrounding the gallbladder is favored to be related to hepatic steatosis and sparing. Recommend attention on follow-up. 4. Degraded evaluation of the pelvis, secondary to beam hardening artifact from left hip arthroplasty. 5. Small hiatal hernia. 6. Decrease in trace pelvic fluid. 7. Aortic Atherosclerosis (ICD10-I70.0).  Assessment & Plan: Heidi Avila is a 74 y.o. woman with Stage IIIC1grade 1 endometrioid endometrial adenocarcinomawho presents after completing adjuvant therapy with carboplatin and paclitaxel with vaginal brachytherapy.  Patient is now about a month out from completion of chemotherapy.  She had significant delays and difficulty during chemotherapy related to side effects.  She overall is doing well today and has recovered significant amount of energy.  We reviewed her posttreatment scan.  There is what is called an enlarged left external iliac node that is borderline in size.  On my review of her CT scan, this appears to be more in the obturator space and comparison with her April scan is difficult secondary to artifact.  There are no other findings concerning for metastatic disease.  Given these results, my recommendation is that we repeat a scan of  her abdomen and pelvis in 3 months.  Finding on scan this week may be related to inflammation or reactive changes in the setting of recent completion of treatment.  The patient voices being very interested in weight loss.  I statically supported this.  She has a friend who recently met with a doctor who helped her develop the nutrition plan.  She and I talked about the importance of increasing calorie output through exercise and decreasing calorie intake.  She is going to reach out to her friend and get the contact number of the doctor helping her with weight loss.  We discussed both the overall health benefits as well as oncologic benefits related to weight loss.  Per NCCN and SGO surveillance recommendations, we will continue with visits every 3 months, alternating between myself and Dr. Sondra Come.  I reviewed signs and symptoms that would be concerning for disease recurrence and the patient knows to call if she  develops any of these before her next scheduled visit.  25 minutes of total time was spent for this patient encounter, including preparation, face-to-face counseling with the patient and coordination of care, and documentation of the encounter.  Jeral Pinch, MD  Division of Gynecologic Oncology  Department of Obstetrics and Gynecology  [facility]

## 2019-12-30 NOTE — Assessment & Plan Note (Signed)
Overall, this is improving since discontinuation of treatment I plan to recheck in a year

## 2019-12-30 NOTE — Patient Instructions (Signed)
Your exam is normal today. As we discussed, I very much support your efforts to lose weight. Adding exercise to your daily routine and decreasing your calorie intake will help your efforts.  I will order a repeat CT for mid-December to look at that lymph node in your pelvis that was seen on your scan this week. I will call you once I have those results.  Otherwise, I'd like to see you in 6 months. You are seeing Dr. Sondra Come in December. Please call my office in December to get an appointment with me scheduled for March 2022.  If you develop any concerning symptoms such as vaginal bleeding, discharge, pelvic pain, rapid weight loss, or change to bowel function, please call to be seen sooner than your next scheduled visit.

## 2019-12-30 NOTE — Progress Notes (Signed)
[place] OFFICE PROGRESS NOTE  Patient Care Team: [facility] as PCP - General Awanda Mink, Craige Cotta, RN as Oncology Nurse Navigator (Oncology) Gery Pray, MD as Consulting Physician (Radiation Oncology)  ASSESSMENT & PLAN:  Endometrial cancer [facility]) I have reviewed multiple CT imaging with the patient So far, there is no conclusive evidence of residual disease The abnormal lymph node described on the CT imaging is likely benign We will observe closely repeat imaging study in the future We discussed port maintenance I recommend she keeps her port for 2 years after treatment I will see her back in a year for further follow-up  CKD (chronic kidney disease), stage III She has mild intermittent acute on chronic renal failure She will continue hydration and risk factor modification  Anemia due to antineoplastic chemotherapy Overall, this is improving since discontinuation of treatment I plan to recheck in a year   No orders of the defined types were placed in this encounter.   All questions were answered. The patient knows to call the clinic with any problems, questions or concerns. The total time spent in the appointment was 20 minutes encounter with patients including review of chart and various tests results, discussions about plan of care and coordination of care plan   Heath Lark, MD 12/30/2019 2:01 PM  INTERVAL HISTORY: Please see below for problem oriented charting. She returns for further follow-up She is feeling well She has some cold sensitivity recently No recent nausea or vomiting  SUMMARY OF ONCOLOGIC HISTORY: Oncology History Overview Note  Endometrioid, MSI High, MLH1 promoter hypermethylation present   Endometrial cancer ([place])  05/18/2019 Initial Biopsy   EMB: Complex atypical hyperplasia, cannot rule out microscopic focus of FIGO grade 1 carcinoma   05/18/2019 Initial Diagnosis   Presented in February 2021 with postmenopausal bleeding.   She was started on Provera and underwent endometrial biopsy showing complex atypical hyperplasia with a possible focus suspicious for grade 1 endometrial cancer.   05/21/2019 Imaging   Pelvic ultrasound at [facility]: Anteverted uterus measuring 8.2 cm in greatest dimension.  Endometrial lining measures 1.73 cm and is thickened.  Left ovary not visualized, right ovary normal in appearance.   06/30/2019 Surgery   Robotic-assisted laparoscopic total hysterectomy with bilateral salpingoophorectomy, bilateral pelvic LND  Frozen section showed endometrial cancer with more than 50% myometrial invasion.  Patient did not map to a sentinel lymph node bilaterally.   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  Tumor measures 3.5 cm and invades focally into the serosa.  No involvement of the cervix or bilateral adnexa.  1 of 20 lymph nodes positive for metastatic carcinoma, focus measures 1 cm without evidence of extracapsular extension.  FINAL MICROSCOPIC DIAGNOSIS: A. UTERUS, CERVIX, BILATERAL FALLOPIAN TUBES AND OVARIES, HYSTERECTOMY WITH SALPINGECTOMY: - Endometrioid adenocarcinoma, 3.5 cm, FIGO grade 1 - Carcinoma invades through the entire myometrium and focally into the serosal surface - Benign unremarkable cervix - Benign unremarkable bilateral fallopian tubes and left ovary - Benign serous inclusion cyst of right ovary - See oncology table B. LYMPH NODE, RIGHT PELVIC, BIOPSY: - Metastatic carcinoma to one of twelve lymph nodes (1/12) - Focus of metastatic carcinoma measures 1.0 cm, without evidence of extracapsular extension C. LYMPH NODE, LEFT PELVIC, BIOPSY: - Eight benign lymph nodes (0/8) ONCOLOGY TABLE: UTERUS, CARCINOMA OR CARCINOSARCOMA Procedure: Total hysterectomy and bilateral salpingo-oophorectomy Histologic type: Endometrioid adenocarcinoma Histologic Grade: FIGO grade 1 Myometrial invasion: Depth of invasion: 30 mm Myometrial  thickness: 30 mm Uterine Serosa Involvement: Present, focal Cervical stromal involvement: Not identified Extent of involvement of other organs: Not identified Lymphovascular invasion: Not identified Regional Lymph Nodes: Examined: 0 Sentinel 20 Non-sentinel 20 Total Lymph nodes with metastasis: 1 Isolated tumor cells (<0.2 mm): 0 Micrometastasis: (>0.2 mm and < 2.0 mm): 0 Macrometastasis: (>2.0 mm): 1 Extracapsular extension: Not identified Representative Tumor Block: A6 MMR / MSI testing: Will be ordered Pathologic Stage Classification (pTNM, AJCC 8th edition): pT3, pN1a Comments: None Mismatch Repair Protein (IHC) SUMMARY INTERPRETATION: ABNORMAL 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 The loss of expression may be secondary to promoter hyper-methylation, gene mutation or other genetic event. BRAF mutation testing and/or MLH1 methylation testing is indicated. The presence of a BRAF mutation and/or MLH1 hypermethylation is indicative of a sporadic-type tumor. The absence of either BRAF mutation and/or presence of normal methylation indicate the possible presence of a hereditary germline mutation (e.g.Lynch syndrome) and referral to genetic counseling is warranted. It is recommended that the loss of protein expression be correlated with molecular based MSI testing   06/30/2019 Cancer Staging   Staging form: Corpus Uteri - Carcinoma and Carcinosarcoma, AJCC 8th Edition - Clinical stage from 06/30/2019: FIGO Stage IIIC1 (cT3, cN1a, cM0) - Signed by Lafonda Mosses, MD on 07/09/2019   07/16/2019 Imaging   1. No evidence of metastatic disease in the chest, abdomen or pelvis. 2. Status post hysterectomy. Small volume simple ascites. Small amount of ill-defined fluid in the pelvic sidewalls, compatible with recent surgery. 3. Small umbilical hernia containing fat and trace ascitic fluid. 4. Chronic findings include: Aortic Atherosclerosis (ICD10-I70.0). Small hiatal  hernia. Mild sigmoid diverticulosis   07/29/2019 Procedure   Ultrasound and fluoroscopically guided right internal jugular single lumen power port catheter insertion. Tip in the SVC/RA junction. Catheter ready for use.   08/03/2019 - 12/02/2019 Chemotherapy   The patient had carboplatin and taxol for chemotherapy treatment.     12/29/2019 Imaging   1. Enlargement of a left external iliac node, borderline sized. This could be reactive or represent early/isolated nodal metastasis. 2. Otherwise, no evidence of metastatic disease in the abdomen or pelvis. 3. Relative hyperattenuation surrounding the gallbladder is favored to be related to hepatic steatosis and sparing. Recommend attention on follow-up. 4. Degraded evaluation of the pelvis, secondary to beam hardening artifact from left hip arthroplasty. 5. Small hiatal hernia. 6. Decrease in trace pelvic fluid. 7. Aortic Atherosclerosis (ICD10-I70.0).     REVIEW OF SYSTEMS:   Constitutional: Denies fevers, chills or abnormal weight loss Eyes: Denies blurriness of vision Ears, nose, mouth, throat, and face: Denies mucositis or sore throat Respiratory: Denies cough, dyspnea or wheezes Cardiovascular: Denies palpitation, chest discomfort or lower extremity swelling Gastrointestinal:  Denies nausea, heartburn or change in bowel habits Skin: Denies abnormal skin rashes Lymphatics: Denies new lymphadenopathy or easy bruising Behavioral/Psych: Mood is stable, no new changes  All other systems were reviewed with the patient and are negative.  I have reviewed the past medical history, past surgical history, social history and family history with the patient and they are unchanged from previous note.  ALLERGIES:  is allergic to amoxicillin and latex.  MEDICATIONS:  Current Outpatient Medications  Medication Sig Dispense Refill  . AMITIZA 24 MCG capsule Take 48 mcg by mouth every evening.     . B Complex Vitamins (B COMPLEX PO) Take 1 Dose by  mouth daily. Liquid b complex    . benazepril-hydrochlorthiazide (LOTENSIN HCT) 20-25  MG tablet Take 1 tablet by mouth every evening.     . diphenhydrAMINE (BENADRYL) 25 MG tablet Take 50 mg by mouth daily as needed for allergies.    Marland Kitchen gabapentin (NEURONTIN) 600 MG tablet Take 600-1,200 mg by mouth See admin instructions. Take 1200 mg in the morning, 600 mg in the afternoon, and 600 mg at night    . Melatonin 5 MG CAPS Take 5 mg by mouth at bedtime.    . meloxicam (MOBIC) 15 MG tablet Take 15 mg by mouth daily in the afternoon.     . methocarbamol (ROBAXIN) 500 MG tablet Take 1,000 mg by mouth 2 (two) times daily as needed.    . Oxymetazoline HCl (VICKS SINEX 12 HOUR NA) Place 1 spray into the nose 2 (two) times daily as needed (congestion).    Marland Kitchen tiZANidine (ZANAFLEX) 4 MG tablet Take 4 mg by mouth at bedtime.    . topiramate (TOPAMAX) 25 MG tablet Take 75 mg by mouth at bedtime.     No current facility-administered medications for this visit.    PHYSICAL EXAMINATION: ECOG PERFORMANCE STATUS: 1 - Symptomatic but completely ambulatory  Vitals:   12/30/19 1306  BP: (!) 153/65  Pulse: 90  Resp: 20  Temp: 99.1 F (37.3 C)  SpO2: 99%   Filed Weights   12/30/19 1306  Weight: 237 lb 12.8 oz (107.9 kg)    GENERAL:alert, no distress and comfortable NEURO: alert & oriented x 3 with fluent speech, no focal motor/sensory deficits  LABORATORY DATA:  I have reviewed the data as listed    Component Value Date/Time   NA 141 12/30/2019 1300   K 3.2 (L) 12/30/2019 1300   CL 102 12/30/2019 1300   CO2 29 12/30/2019 1300   GLUCOSE 129 (H) 12/30/2019 1300   BUN 14 12/30/2019 1300   CREATININE 1.62 (H) 12/30/2019 1300   CALCIUM 9.5 12/30/2019 1300   PROT 6.8 12/30/2019 1300   ALBUMIN 3.7 12/30/2019 1300   AST 13 (L) 12/30/2019 1300   ALT 14 12/30/2019 1300   ALKPHOS 47 12/30/2019 1300   BILITOT 0.4 12/30/2019 1300   GFRNONAA 31 (L) 12/30/2019 1300   GFRAA 36 (L) 12/30/2019 1300    No  results found for: SPEP, UPEP  Lab Results  Component Value Date   WBC 5.7 12/30/2019   NEUTROABS 3.9 12/30/2019   HGB 9.6 (L) 12/30/2019   HCT 29.3 (L) 12/30/2019   MCV 101.0 (H) 12/30/2019   PLT 231 12/30/2019      Chemistry      Component Value Date/Time   NA 141 12/30/2019 1300   K 3.2 (L) 12/30/2019 1300   CL 102 12/30/2019 1300   CO2 29 12/30/2019 1300   BUN 14 12/30/2019 1300   CREATININE 1.62 (H) 12/30/2019 1300      Component Value Date/Time   CALCIUM 9.5 12/30/2019 1300   ALKPHOS 47 12/30/2019 1300   AST 13 (L) 12/30/2019 1300   ALT 14 12/30/2019 1300   BILITOT 0.4 12/30/2019 1300       RADIOGRAPHIC STUDIES: I have reviewed multiple imaging studies with the patient I have personally reviewed the radiological images as listed and agreed with the findings in the report. CT ABDOMEN PELVIS W CONTRAST  Result Date: 12/29/2019 CLINICAL DATA:  Uterine/cervical cancer. Evaluate treatment response. Status post chemotherapy. Chemotherapy completed 2 weeks ago. EXAM: CT ABDOMEN AND PELVIS WITH CONTRAST TECHNIQUE: Multidetector CT imaging of the abdomen and pelvis was performed using the standard protocol  following bolus administration of intravenous contrast. CONTRAST:  21m OMNIPAQUE IOHEXOL 300 MG/ML  SOLN COMPARISON:  07/16/2019 FINDINGS: Lower chest: Clear lung bases. A central line is incompletely imaged, terminating in the high right atrium. Normal heart size without pericardial or pleural effusion. Small hiatal hernia. Hepatobiliary: Nonspecific caudate lobe enlargement. Suspect mild hepatic steatosis with sparing adjacent the gallbladder, including on images 31 and 36 of series 2. On image 36, relative hyperattenuation is somewhat more nodular, felt to be similar to on the prior at 9 mm. Normal gallbladder, without biliary ductal dilatation. Pancreas: Normal, without mass or ductal dilatation. Spleen: Normal in size, without focal abnormality. Adrenals/Urinary Tract: Normal  adrenal glands. Too small to characterize lower pole right renal lesion. Normal left kidney. No hydronephrosis. Degraded evaluation of the pelvis, secondary to beam hardening artifact from left hip arthroplasty. Decompressed urinary bladder. Stomach/Bowel: Gastric antral underdistention. Normal colon and terminal ileum. Normal small bowel. Vascular/Lymphatic: Aortic atherosclerosis. No abdominal adenopathy. Left external iliac node measures 1.0 cm on 85/2 versus 6 mm on the prior. Reproductive: Hysterectomy. Other: Decrease in trace pelvic fluid, including on 79/2. Resolution of abdominal ascites. No free intraperitoneal air. No evidence of omental or peritoneal disease. Ventral pelvic wall laxity or hernia is tiny containing fat. Musculoskeletal: Left hip arthroplasty.  Osteopenia. IMPRESSION: 1. Enlargement of a left external iliac node, borderline sized. This could be reactive or represent early/isolated nodal metastasis. 2. Otherwise, no evidence of metastatic disease in the abdomen or pelvis. 3. Relative hyperattenuation surrounding the gallbladder is favored to be related to hepatic steatosis and sparing. Recommend attention on follow-up. 4. Degraded evaluation of the pelvis, secondary to beam hardening artifact from left hip arthroplasty. 5. Small hiatal hernia. 6. Decrease in trace pelvic fluid. 7. Aortic Atherosclerosis (ICD10-I70.0). Electronically Signed   By: KAbigail MiyamotoM.D.   On: 12/29/2019 08:37

## 2020-01-07 ENCOUNTER — Inpatient Hospital Stay (HOSPITAL_BASED_OUTPATIENT_CLINIC_OR_DEPARTMENT_OTHER): Payer: Medicare Other | Admitting: Genetic Counselor

## 2020-01-07 ENCOUNTER — Inpatient Hospital Stay: Payer: Medicare Other

## 2020-01-07 ENCOUNTER — Encounter: Payer: Self-pay | Admitting: Genetic Counselor

## 2020-01-07 ENCOUNTER — Other Ambulatory Visit: Payer: Self-pay

## 2020-01-07 DIAGNOSIS — Z808 Family history of malignant neoplasm of other organs or systems: Secondary | ICD-10-CM | POA: Diagnosis not present

## 2020-01-07 DIAGNOSIS — Z800 Family history of malignant neoplasm of digestive organs: Secondary | ICD-10-CM | POA: Diagnosis not present

## 2020-01-07 DIAGNOSIS — Z801 Family history of malignant neoplasm of trachea, bronchus and lung: Secondary | ICD-10-CM | POA: Diagnosis not present

## 2020-01-07 DIAGNOSIS — Z8042 Family history of malignant neoplasm of prostate: Secondary | ICD-10-CM | POA: Diagnosis not present

## 2020-01-07 DIAGNOSIS — C541 Malignant neoplasm of endometrium: Secondary | ICD-10-CM

## 2020-01-07 NOTE — Progress Notes (Signed)
REFERRING PROVIDER: Heath Lark, MD [place],  [place] [id number]  PRIMARY PROVIDER:  [facility]  PRIMARY REASON FOR VISIT:  1. Endometrial cancer ([place])   2. Family history of colon cancer   3. Family history of melanoma   4. Family history of prostate cancer   5. Family history of lung cancer      HISTORY OF PRESENT ILLNESS:   Heidi Avila, a 74 y.o. female, was seen for a [facility] cancer genetics consultation at the request of Dr. Alvy Bimler due to a personal and family history of cancer.  Heidi Avila presents to clinic today to discuss the possibility of a hereditary predisposition to cancer, genetic testing, and to further clarify her future cancer risks, as well as potential cancer risks for family members.   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 The treatment plan included surgery (completed 06/30/2019) and chemotherapy. Mismatch repair protein IHC was abnormal for loss of MLH1 and PMS2 staining. Subsequent testing showed that MLH1 promoter hypermethylation was present.   CANCER HISTORY:  Oncology History Overview Note  Endometrioid, MSI High, MLH1 promoter hypermethylation present   Endometrial cancer ([place])  05/18/2019 Initial Biopsy   EMB: Complex atypical hyperplasia, cannot rule out microscopic focus of FIGO grade 1 carcinoma   05/18/2019 Initial Diagnosis   Presented in February 2021 with postmenopausal bleeding.  She was started on Provera and underwent endometrial biopsy showing complex atypical hyperplasia with a possible focus suspicious for grade 1 endometrial cancer.   05/21/2019 Imaging   Pelvic ultrasound at [facility]: Anteverted uterus measuring 8.2 cm in greatest dimension.  Endometrial lining measures 1.73 cm and is thickened.  Left ovary not visualized, right ovary normal in appearance.   06/30/2019 Surgery   Robotic-assisted laparoscopic total  hysterectomy with bilateral salpingoophorectomy, bilateral pelvic LND  Frozen section showed endometrial cancer with more than 50% myometrial invasion.  Patient did not map to a sentinel lymph node bilaterally.   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  Tumor measures 3.5 cm and invades focally into the serosa.  No involvement of the cervix or bilateral adnexa.  1 of 20 lymph nodes positive for metastatic carcinoma, focus measures 1 cm without evidence of extracapsular extension.  FINAL MICROSCOPIC DIAGNOSIS: A. UTERUS, CERVIX, BILATERAL FALLOPIAN TUBES AND OVARIES, HYSTERECTOMY WITH SALPINGECTOMY: - Endometrioid adenocarcinoma, 3.5 cm, FIGO grade 1 - Carcinoma invades through the entire myometrium and focally into the serosal surface - Benign unremarkable cervix - Benign unremarkable bilateral fallopian tubes and left ovary - Benign serous inclusion cyst of right ovary - See oncology table B. LYMPH NODE, RIGHT PELVIC, BIOPSY: - Metastatic carcinoma to one of twelve lymph nodes (1/12) - Focus of metastatic carcinoma measures 1.0 cm, without evidence of extracapsular extension C. LYMPH NODE, LEFT PELVIC, BIOPSY: - Eight benign lymph nodes (0/8) ONCOLOGY TABLE: UTERUS, CARCINOMA OR CARCINOSARCOMA Procedure: Total hysterectomy and bilateral salpingo-oophorectomy Histologic type: Endometrioid adenocarcinoma Histologic Grade: FIGO grade 1 Myometrial invasion: Depth of invasion: 30 mm Myometrial thickness: 30 mm Uterine Serosa Involvement: Present, focal Cervical stromal involvement: Not identified Extent of involvement of other organs: Not identified Lymphovascular invasion: Not identified Regional Lymph Nodes: Examined: 0 Sentinel 20 Non-sentinel 20 Total Lymph nodes with metastasis: 1 Isolated tumor cells (<0.2 mm): 0 Micrometastasis: (>0.2 mm and < 2.0 mm): 0 Macrometastasis: (>2.0 mm): 1 Extracapsular extension: Not  identified Representative Tumor Block: A6  MMR / MSI testing: Will be ordered Pathologic Stage Classification (pTNM, AJCC 8th edition): pT3, pN1a Comments: None Mismatch Repair Protein (IHC) SUMMARY INTERPRETATION: ABNORMAL 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 The loss of expression may be secondary to promoter hyper-methylation, gene mutation or other genetic event. BRAF mutation testing and/or MLH1 methylation testing is indicated. The presence of a BRAF mutation and/or MLH1 hypermethylation is indicative of a sporadic-type tumor. The absence of either BRAF mutation and/or presence of normal methylation indicate the possible presence of a hereditary germline mutation (e.g.Lynch syndrome) and referral to genetic counseling is warranted. It is recommended that the loss of protein expression be correlated with molecular based MSI testing   06/30/2019 Cancer Staging   Staging form: Corpus Uteri - Carcinoma and Carcinosarcoma, AJCC 8th Edition - Clinical stage from 06/30/2019: FIGO Stage IIIC1 (cT3, cN1a, cM0) - Signed by Lafonda Mosses, MD on 07/09/2019   07/16/2019 Imaging   1. No evidence of metastatic disease in the chest, abdomen or pelvis. 2. Status post hysterectomy. Small volume simple ascites. Small amount of ill-defined fluid in the pelvic sidewalls, compatible with recent surgery. 3. Small umbilical hernia containing fat and trace ascitic fluid. 4. Chronic findings include: Aortic Atherosclerosis (ICD10-I70.0). Small hiatal hernia. Mild sigmoid diverticulosis   07/29/2019 Procedure   Ultrasound and fluoroscopically guided right internal jugular single lumen power port catheter insertion. Tip in the SVC/RA junction. Catheter ready for use.   08/03/2019 - 12/02/2019 Chemotherapy   The patient had carboplatin and taxol for chemotherapy treatment.     12/29/2019 Imaging   1. Enlargement of a left external iliac node, borderline sized. This could be reactive or  represent early/isolated nodal metastasis. 2. Otherwise, no evidence of metastatic disease in the abdomen or pelvis. 3. Relative hyperattenuation surrounding the gallbladder is favored to be related to hepatic steatosis and sparing. Recommend attention on follow-up. 4. Degraded evaluation of the pelvis, secondary to beam hardening artifact from left hip arthroplasty. 5. Small hiatal hernia. 6. Decrease in trace pelvic fluid. 7. Aortic Atherosclerosis (ICD10-I70.0).     RISK FACTORS:  Menarche was at age 4.  First live birth at age 51.  OCP use for more than 5 years.  Ovaries intact: no.  Hysterectomy: yes.  Menopausal status: postmenopausal.  HRT use: a few months. Colonoscopy: yes; 2017. Mammogram within the last year: no. Number of breast biopsies: 1. Any excessive radiation exposure in the past: no   Past Medical History:  Diagnosis Date  . Cancer ([place]) 06/2019  . Cataract   . Complex endometrial hyperplasia with atypia   . Dyspnea   . Family history of colon cancer   . Family history of lung cancer   . Family history of melanoma   . Family history of prostate cancer   . GERD (gastroesophageal reflux disease)   . Heart murmur    as a child  . Hypertension   . Obesity   . Pneumonia 2018  . Post-menopausal bleeding 05/18/2019    Past Surgical History:  Procedure Laterality Date  . BIOPSY ENDOMETRIAL    . CATARACT EXTRACTION  03/2019  . ESOPHAGOGASTRODUODENOSCOPY    . IR IMAGING GUIDED PORT INSERTION  07/29/2019  . ROBOTIC ASSISTED TOTAL HYSTERECTOMY WITH BILATERAL SALPINGO OOPHERECTOMY Bilateral 06/30/2019   Procedure: XI ROBOTIC ASSISTED TOTAL HYSTERECTOMY WITH BILATERAL SALPINGO OOPHORECTOMY, BILATERAL LYMPH NODE DISSECTION;  Surgeon: Lafonda Mosses, MD;  Location: [facility];  Service: Gynecology;  Laterality: Bilateral;  . SENTINEL  NODE BIOPSY N/A 06/30/2019   Procedure: SENTINEL LYMPH NODE BIOPSY;  Surgeon: Lafonda Mosses, MD;  Location: [facility];  Service:  Gynecology;  Laterality: N/A;  . TONSILLECTOMY    . TOTAL HIP ARTHROPLASTY Left   . WISDOM TOOTH EXTRACTION      Social History   Socioeconomic History  . Marital status: Divorced    Spouse name: Not on file  . Number of children: Not on file  . Years of education: Not on file  . Highest education level: Not on file  Occupational History  . Not on file  Tobacco Use  . Smoking status: Never Smoker  . Smokeless tobacco: Never Used  Vaping Use  . Vaping Use: Never used  Substance and Sexual Activity  . Alcohol use: No  . Drug use: Never  . Sexual activity: Not Currently  Other Topics Concern  . Not on file  Social History Narrative  . Not on file   Social Determinants of Health   Financial Resource Strain:   . Difficulty of Paying Living Expenses: Not on file  Food Insecurity:   . Worried About Charity fundraiser in the Last Year: Not on file  . Ran Out of Food in the Last Year: Not on file  Transportation Needs:   . Lack of Transportation (Medical): Not on file  . Lack of Transportation (Non-Medical): Not on file  Physical Activity:   . Days of Exercise per Week: Not on file  . Minutes of Exercise per Session: Not on file  Stress:   . Feeling of Stress : Not on file  Social Connections:   . Frequency of Communication with Friends and Family: Not on file  . Frequency of Social Gatherings with Friends and Family: Not on file  . Attends Religious Services: Not on file  . Active Member of Clubs or Organizations: Not on file  . Attends Archivist Meetings: Not on file  . Marital Status: Not on file     FAMILY HISTORY:  We obtained a detailed, 4-generation family history.  Significant diagnoses are listed below: Family History  Problem Relation Age of Onset  . Colon cancer Mother 106       treated with surgery only  . Lung cancer Mother        dx in her mid-35s  . CVA Father   . Melanoma Sister        dx <60  . Cancer Maternal Aunt        unknown  type, dx >50  . Melanoma Nephew        dx in his 72s  . Endometrial cancer Neg Hx   . Ovarian cancer Neg Hx   . Breast cancer Neg Hx    Ms. Beebe has one son who is 13 and has not had cancer. She has one brother (age 31) and one sister (age 31). Her sister had a melanoma on her nose removed more than 20 years ago. Her sister's son also had melanoma diagnosed in his 77s.   Ms. Marcoe mother died at the age of 30 and had a history of colon cancer diagnosed in her early 22s and lung cancer diagnosed in her mid 47s. Ms. Windsor mother had one 30, one full-sister, two paternal half-brothers, and one paternal half-sister. The half-sister was diagnosed with an unknown type of cancer when she was older than 19. Ms. Stetler maternal grandmother died at the age of 6, and she does not know how  old her paternal grandfather was when he died. There are no other known diagnoses of cancer on the maternal side of the family.  Ms. Shan father died at the age of 1 and had a history of prostate cancer diagnosed in his late 34s. This prostate cancer was not metastatic. She had four paternal uncles and one paternal aunt. She has limited information about these family members. Her paternal grandmother died in her 78s, and her paternal grandfather died at a "young" age. There are no other known diagnoses of cancer on the paternal side of the family.  Ms. Phagan is unaware of previous family history of genetic testing for hereditary cancer risks. Patient's maternal ancestors are of [place] descent, and paternal ancestors are of unknown descent. There is no reported Ashkenazi Jewish ancestry. There is no known consanguinity.  GENETIC COUNSELING ASSESSMENT: Ms. Rothlisberger is a 74 y.o. female with a personal history of endometrial cancer which is not suggestive of a hereditary cancer syndrome and predisposition to cancer. We, therefore, discussed and recommended the following at today's visit.   DISCUSSION:   We reviewed that Ms. Tirado had abnormal mismatch repair protein IHC, which showed loss of staining of the MLH1 and PMS2 proteins. Additional testing showed that her tumor is MSI-High. We discussed that these were screening tests done on her tumor that can help identify people who may have Lynch syndrome - a hereditary cancer syndrome that increases the risk for colorectal and endometrial cancers, in addition to other cancer types. An MLH1 hypermethylation assay was also performed to further distinguish whether her endometrial tumor may be due to Lynch syndrome, or whether it may be a sporadic cancer. This assay showed that MLH1 hypermethylation is present in her tumor, explaining why she had abnormal IHC staining and microsatellite instability, and indicating that her cancer is likely sporadic and not due to Lynch syndrome.   We reviewed the characteristics, features and inheritance patterns of hereditary cancer syndromes. We discussed with Ms. Chhim that the personal and family history does not meet insurance or NCCN criteria for genetic testing and, therefore, is not highly consistent with a familial hereditary cancer syndrome.  We feel she is at low risk to harbor a gene mutation associated with such a condition. Thus, we did not recommend any genetic testing, at this time, and recommended Ms. Goodie continue to follow the cancer screening guidelines given by her primary healthcare provider.  PLAN:  Ms. Hattery will not proceed with genetic testing due to the low likelihood that she has a germline mutation and hereditary cancer syndrome. We, therefore, recommend Ms. Koestner continue to follow the cancer screening guidelines given by her primary healthcare provider.  Lastly, we encouraged Ms. Tsuda to remain in contact with cancer genetics annually so that we can continuously update the family history and inform her of any changes in cancer genetics and testing that may be of benefit for this family.    Ms. Culton questions were answered to her satisfaction today. Our contact information was provided should additional questions or concerns arise. Thank you for the referral and allowing [place] to share in the care of your patient.   Clint Guy, [place] Licensed, Certified Dispensing [email or web address] Phone: [phone]  The patient was seen for a total of 30 minutes in face-to-face genetic counseling.  This patient was discussed with Drs. Magrinat, Lindi Adie and/or Burr Medico who agrees with the above.    _______________________________________________________________________ For Office Staff:  Number of people involved in session: 1 Was an  Intern/ student involved with case: no

## 2020-01-08 ENCOUNTER — Ambulatory Visit: Payer: Medicare Other | Admitting: Gynecologic Oncology

## 2020-01-18 ENCOUNTER — Encounter: Payer: Self-pay | Admitting: Genetic Counselor

## 2020-02-04 ENCOUNTER — Other Ambulatory Visit: Payer: Self-pay

## 2020-02-11 ENCOUNTER — Other Ambulatory Visit: Payer: Self-pay | Admitting: *Deleted

## 2020-02-11 NOTE — Patient Outreach (Signed)
[facility]) Care Management  02/11/2020  [place] 09/26/1945 [id number]  Unsuccessful outreach attempt #1 for screening made to patient. RN Health Coach left HIPAA compliant voicemail message along with her contact information.  Plan: RN Health Coach will call patient within the next several business days and will send an unsuccessful letter.   Emelia Loron RN, BSN [place] [phone] [email or web address]

## 2020-02-12 DIAGNOSIS — Z0000 Encounter for general adult medical examination without abnormal findings: Secondary | ICD-10-CM | POA: Diagnosis not present

## 2020-02-12 DIAGNOSIS — I10 Essential (primary) hypertension: Secondary | ICD-10-CM | POA: Diagnosis not present

## 2020-02-12 DIAGNOSIS — Z1231 Encounter for screening mammogram for malignant neoplasm of breast: Secondary | ICD-10-CM | POA: Diagnosis not present

## 2020-02-15 DIAGNOSIS — Z23 Encounter for immunization: Secondary | ICD-10-CM | POA: Diagnosis not present

## 2020-02-15 NOTE — Patient Outreach (Addendum)
[facility]) Care Management  02/11/20 [place] 18-Dec-1945 [id number]  Unsuccessful outreach attempt made to patient. RN Health Coach left HIPAA compliant voicemail message along with her contact information.  Plan: RN Health Coach will call patient within the month of November and will send an unsuccessful letter.   Emelia Loron RN, BSN [place] [phone] [email or web address]

## 2020-02-18 ENCOUNTER — Other Ambulatory Visit: Payer: Self-pay | Admitting: *Deleted

## 2020-02-18 NOTE — Patient Outreach (Signed)
[facility]) Care Management  02/18/2020  [place] 21-May-1945 [id number]  Unsuccessful outreach attempt made to patient. RN Health Coach left HIPAA compliant voicemail message along with her contact information.  Plan: RN Health Coach will call patient within the next several business days.  Emelia Loron RN, BSN [place] [phone] [email or web address]

## 2020-02-23 ENCOUNTER — Other Ambulatory Visit: Payer: Self-pay | Admitting: *Deleted

## 2020-02-23 NOTE — Patient Outreach (Signed)
[facility]) Care Management  02/23/2020  [place] [birth year] [id number]  Unsuccessful outreach attempt made to patient. RN Health Coach left HIPAA compliant voicemail message along with her contact information.  Plan: RN Health Coach will call patient within the month of December.  Emelia Loron RN, BSN [place] [phone] [email or web address]

## 2020-02-24 ENCOUNTER — Inpatient Hospital Stay: Payer: Medicare Other | Attending: Hematology and Oncology

## 2020-02-24 ENCOUNTER — Other Ambulatory Visit: Payer: Self-pay

## 2020-02-24 DIAGNOSIS — C541 Malignant neoplasm of endometrium: Secondary | ICD-10-CM

## 2020-02-24 DIAGNOSIS — Z452 Encounter for adjustment and management of vascular access device: Secondary | ICD-10-CM | POA: Insufficient documentation

## 2020-02-24 MED ORDER — SODIUM CHLORIDE 0.9% FLUSH
10.0000 mL | Freq: Once | INTRAVENOUS | Status: AC
  Administered 2020-02-24: 10 mL
  Filled 2020-02-24: qty 10

## 2020-02-24 MED ORDER — HEPARIN SOD (PORK) LOCK FLUSH 100 UNIT/ML IV SOLN
250.0000 [IU] | Freq: Once | INTRAVENOUS | Status: AC
  Administered 2020-02-24: 250 [IU]
  Filled 2020-02-24: qty 5

## 2020-03-23 ENCOUNTER — Other Ambulatory Visit: Payer: Self-pay | Admitting: Hematology and Oncology

## 2020-03-23 DIAGNOSIS — C541 Malignant neoplasm of endometrium: Secondary | ICD-10-CM

## 2020-03-24 ENCOUNTER — Other Ambulatory Visit: Payer: Self-pay

## 2020-03-24 ENCOUNTER — Inpatient Hospital Stay: Payer: Medicare Other | Attending: Hematology and Oncology

## 2020-03-24 ENCOUNTER — Inpatient Hospital Stay: Payer: Medicare Other

## 2020-03-24 DIAGNOSIS — C541 Malignant neoplasm of endometrium: Secondary | ICD-10-CM | POA: Diagnosis not present

## 2020-03-24 LAB — CBC WITH DIFFERENTIAL/PLATELET
Abs Immature Granulocytes: 0.02 10*3/uL (ref 0.00–0.07)
Basophils Absolute: 0.1 10*3/uL (ref 0.0–0.1)
Basophils Relative: 1 %
Eosinophils Absolute: 0.3 10*3/uL (ref 0.0–0.5)
Eosinophils Relative: 8 %
HCT: 33.5 % — ABNORMAL LOW (ref 36.0–46.0)
Hemoglobin: 11 g/dL — ABNORMAL LOW (ref 12.0–15.0)
Immature Granulocytes: 0 %
Lymphocytes Relative: 21 %
Lymphs Abs: 1 10*3/uL (ref 0.7–4.0)
MCH: 29.8 pg (ref 26.0–34.0)
MCHC: 32.8 g/dL (ref 30.0–36.0)
MCV: 90.8 fL (ref 80.0–100.0)
Monocytes Absolute: 0.6 10*3/uL (ref 0.1–1.0)
Monocytes Relative: 12 %
Neutro Abs: 2.6 10*3/uL (ref 1.7–7.7)
Neutrophils Relative %: 58 %
Platelets: 247 10*3/uL (ref 150–400)
RBC: 3.69 MIL/uL — ABNORMAL LOW (ref 3.87–5.11)
RDW: 12.8 % (ref 11.5–15.5)
WBC: 4.6 10*3/uL (ref 4.0–10.5)
nRBC: 0 % (ref 0.0–0.2)

## 2020-03-24 LAB — COMPREHENSIVE METABOLIC PANEL
ALT: 12 U/L (ref 0–44)
AST: 13 U/L — ABNORMAL LOW (ref 15–41)
Albumin: 3.6 g/dL (ref 3.5–5.0)
Alkaline Phosphatase: 38 U/L (ref 38–126)
Anion gap: 11 (ref 5–15)
BUN: 19 mg/dL (ref 8–23)
CO2: 25 mmol/L (ref 22–32)
Calcium: 9.5 mg/dL (ref 8.9–10.3)
Chloride: 105 mmol/L (ref 98–111)
Creatinine, Ser: 1.63 mg/dL — ABNORMAL HIGH (ref 0.44–1.00)
GFR, Estimated: 33 mL/min — ABNORMAL LOW (ref >60)
Glucose, Bld: 98 mg/dL (ref 70–99)
Potassium: 3.8 mmol/L (ref 3.5–5.1)
Sodium: 141 mmol/L (ref 135–145)
Total Bilirubin: 0.4 mg/dL (ref 0.3–1.2)
Total Protein: 6.5 g/dL (ref 6.5–8.1)

## 2020-03-24 MED ORDER — SODIUM CHLORIDE 0.9% FLUSH
10.0000 mL | Freq: Once | INTRAVENOUS | Status: AC
  Administered 2020-03-24: 10 mL
  Filled 2020-03-24: qty 10

## 2020-03-24 MED ORDER — HEPARIN SOD (PORK) LOCK FLUSH 100 UNIT/ML IV SOLN
250.0000 [IU] | Freq: Once | INTRAVENOUS | Status: AC
  Administered 2020-03-24: 500 [IU]
  Filled 2020-03-24: qty 5

## 2020-03-24 NOTE — Patient Instructions (Signed)

## 2020-03-28 ENCOUNTER — Other Ambulatory Visit: Payer: Self-pay | Admitting: *Deleted

## 2020-03-28 NOTE — Patient Outreach (Signed)
[facility]) Care Management  03/28/2020  [place] 1946-03-15 [id number]  Multiple attempts to establish contact with patient without success. No response from letter mailed to patient. Case is being closed at this time.   Plan: RN Health Coach will close case.  Emelia Loron RN, BSN [place] [phone] [email or web address]

## 2020-03-30 ENCOUNTER — Encounter (HOSPITAL_COMMUNITY): Payer: Self-pay

## 2020-03-30 ENCOUNTER — Other Ambulatory Visit: Payer: Self-pay

## 2020-03-30 ENCOUNTER — Ambulatory Visit (HOSPITAL_COMMUNITY)
Admission: RE | Admit: 2020-03-30 | Discharge: 2020-03-30 | Disposition: A | Discharge status: Home / Self Care | Payer: Medicare Other | Source: Ambulatory Visit | Attending: Gynecologic Oncology | Admitting: Gynecologic Oncology

## 2020-03-30 DIAGNOSIS — C541 Malignant neoplasm of endometrium: Secondary | ICD-10-CM | POA: Diagnosis not present

## 2020-03-30 DIAGNOSIS — K5730 Diverticulosis of large intestine without perforation or abscess without bleeding: Secondary | ICD-10-CM | POA: Diagnosis not present

## 2020-03-30 DIAGNOSIS — K429 Umbilical hernia without obstruction or gangrene: Secondary | ICD-10-CM | POA: Diagnosis not present

## 2020-03-30 DIAGNOSIS — Z452 Encounter for adjustment and management of vascular access device: Secondary | ICD-10-CM | POA: Diagnosis not present

## 2020-03-30 MED ORDER — HEPARIN SOD (PORK) LOCK FLUSH 100 UNIT/ML IV SOLN
500.0000 [IU] | Freq: Once | INTRAVENOUS | Status: AC
  Administered 2020-03-30: 11:00:00 500 [IU] via INTRAVENOUS

## 2020-03-30 MED ORDER — SODIUM CHLORIDE (PF) 0.9 % IJ SOLN
INTRAMUSCULAR | Status: AC
  Filled 2020-03-30: qty 100

## 2020-03-30 MED ORDER — IOHEXOL 300 MG/ML  SOLN
80.0000 mL | Freq: Once | INTRAMUSCULAR | Status: AC | PRN
  Administered 2020-03-30: 11:00:00 80 mL via INTRAVENOUS

## 2020-03-30 MED ORDER — HEPARIN SOD (PORK) LOCK FLUSH 100 UNIT/ML IV SOLN
INTRAVENOUS | Status: AC
  Filled 2020-03-30: qty 5

## 2020-04-01 ENCOUNTER — Other Ambulatory Visit: Payer: Self-pay | Admitting: Gynecologic Oncology

## 2020-04-01 DIAGNOSIS — C541 Malignant neoplasm of endometrium: Secondary | ICD-10-CM

## 2020-04-01 NOTE — Progress Notes (Signed)
Called the patient to review CT findings. Nonspecific, but will need follow-up in 3-4 months to reassess several prominent lymph nodes.  Jeral Pinch MD Gynecologic Oncology

## 2020-04-07 DIAGNOSIS — K5909 Other constipation: Secondary | ICD-10-CM | POA: Diagnosis not present

## 2020-04-07 DIAGNOSIS — Z1211 Encounter for screening for malignant neoplasm of colon: Secondary | ICD-10-CM | POA: Diagnosis not present

## 2020-04-13 DIAGNOSIS — Z1159 Encounter for screening for other viral diseases: Secondary | ICD-10-CM | POA: Diagnosis not present

## 2020-04-14 ENCOUNTER — Other Ambulatory Visit: Payer: Self-pay

## 2020-04-14 ENCOUNTER — Encounter: Payer: Self-pay | Admitting: Radiation Oncology

## 2020-04-14 ENCOUNTER — Ambulatory Visit
Admission: RE | Admit: 2020-04-14 | Discharge: 2020-04-14 | Disposition: A | Discharge status: Home / Self Care | Payer: Medicare Other | Source: Ambulatory Visit | Attending: Radiation Oncology | Admitting: Radiation Oncology

## 2020-04-14 DIAGNOSIS — K5730 Diverticulosis of large intestine without perforation or abscess without bleeding: Secondary | ICD-10-CM | POA: Diagnosis not present

## 2020-04-14 DIAGNOSIS — C541 Malignant neoplasm of endometrium: Secondary | ICD-10-CM | POA: Diagnosis not present

## 2020-04-14 DIAGNOSIS — Z8542 Personal history of malignant neoplasm of other parts of uterus: Secondary | ICD-10-CM | POA: Diagnosis not present

## 2020-04-14 DIAGNOSIS — I700 Atherosclerosis of aorta: Secondary | ICD-10-CM | POA: Insufficient documentation

## 2020-04-14 DIAGNOSIS — K449 Diaphragmatic hernia without obstruction or gangrene: Secondary | ICD-10-CM | POA: Diagnosis not present

## 2020-04-14 DIAGNOSIS — Z79899 Other long term (current) drug therapy: Secondary | ICD-10-CM | POA: Insufficient documentation

## 2020-04-14 DIAGNOSIS — K429 Umbilical hernia without obstruction or gangrene: Secondary | ICD-10-CM | POA: Diagnosis not present

## 2020-04-14 DIAGNOSIS — Z9221 Personal history of antineoplastic chemotherapy: Secondary | ICD-10-CM | POA: Insufficient documentation

## 2020-04-14 DIAGNOSIS — Z08 Encounter for follow-up examination after completed treatment for malignant neoplasm: Secondary | ICD-10-CM | POA: Diagnosis not present

## 2020-04-14 NOTE — Progress Notes (Signed)
Patient here for a 6 month f/u visit with Dr. Sondra Come. Patient denies pain or any other sx.  BP (!) 139/59 (BP Location: Left Arm, Patient Position: Sitting)   Pulse 74   Temp (!) 96.9 F (36.1 C) (Temporal)   Resp 18   Ht 5\' 3"  (1.6 m)   Wt 236 lb 2 oz (107.1 kg)   SpO2 98%   BMI 41.83 kg/m   Wt Readings from Last 3 Encounters:  04/14/20 236 lb 2 oz (107.1 kg)  12/30/19 237 lb (107.5 kg)  12/30/19 237 lb 12.8 oz (107.9 kg)

## 2020-04-14 NOTE — Progress Notes (Signed)
Radiation Oncology         [phone] ________________________________  Name: Heidi Avila MRN: [id number]  Date: 04/14/2020  DOB: [birth year]  Follow-Up Visit Note  CC: [facility], [facility]  Lafonda Mosses, MD    ICD-10-CM   1. Endometrial cancer (HCC)  C54.1     Diagnosis: Stage IIIC1grade 1 endometrioid endometrial adenocarcinoma  Interval Since Last Radiation: Six months  Radiation Treatment Dates: 09/07/2019 through 10/14/2019 Site Technique Total Dose (Gy) Dose per Fx (Gy) Completed Fx Beam Energies  Vagina: Pelvis HDR-brachy 30/30 6 5/5 Ir-192   Narrative:  The patient returns today for routine follow-up. Since her last visit, she completed chemotherapy with Carboplatin and Taxol on 12/02/2019 under the care of Dr. Alvy Bimler. She had an excessive delay due to severe pancytopenia, which had improved.  CT scan of abdomen and pelvis on 12/28/2019 showed enlargement of a left external iliac node, borderline sized, which could be reactive or represent early/islated nodal metastasis. Otherwise, there was no evidence of metastatic disease in the abdomen or pelvis.  She was last seen by Dr. Berline Lopes on 12/30/2019, during which time it was recommended that she undergo a repeat CT scan of the abdomen and pelvis in three months, as the findings may have been related to inflammation or reactive changes in the setting of recent completion of treatment.  CT scan of abdomen and pelvis on 03/30/2020 showed interval development of a nonspecific prominent 0.8 cm retroperitoneal lymph node. However, there was no definite recurrent or metastatic disease. The prominent bilateral [place] lymph nodes that measured up to 0.9 cm on the left were stable.  On review of systems, she reports she reports feeling well.  No new medical issues since last follow-up. She denies vaginal bleeding or abdominal bloating.  She denies any pelvic pain or low back pain.  She continues to use her vaginal dilator  and has not noticed any bleeding with this procedure..  She denies any hematuria or rectal bleeding..  ALLERGIES:  is allergic to amoxicillin and latex.  Meds: Current Outpatient Medications  Medication Sig Dispense Refill   AMITIZA 24 MCG capsule Take 48 mcg by mouth every evening.      B Complex Vitamins (B COMPLEX PO) Take 1 Dose by mouth daily. Liquid b complex     benazepril-hydrochlorthiazide (LOTENSIN HCT) 20-25 MG tablet Take 1 tablet by mouth every evening.      diphenhydrAMINE (BENADRYL) 25 MG tablet Take 50 mg by mouth daily as needed for allergies.     gabapentin (NEURONTIN) 600 MG tablet Take 600-1,200 mg by mouth See admin instructions. Take 1200 mg in the morning, 600 mg in the afternoon, and 600 mg at night     Melatonin 5 MG CAPS Take 5 mg by mouth at bedtime.     meloxicam (MOBIC) 15 MG tablet Take 15 mg by mouth daily in the afternoon.      methocarbamol (ROBAXIN) 500 MG tablet Take 1,000 mg by mouth 2 (two) times daily as needed.     Oxymetazoline HCl (VICKS SINEX 12 HOUR NA) Place 1 spray into the nose 2 (two) times daily as needed (congestion).     tiZANidine (ZANAFLEX) 4 MG tablet Take 4 mg by mouth at bedtime.     topiramate (TOPAMAX) 25 MG tablet Take 75 mg by mouth at bedtime.     No current facility-administered medications for this encounter.    Physical Findings: The patient is in no acute distress. Patient is alert and  oriented.  height is 5\' 3"  (1.6 m) and weight is 236 lb 2 oz (107.1 kg). Her temporal temperature is 96.9 F (36.1 C) (abnormal). Her blood pressure is 139/59 (abnormal) and her pulse is 74. Her respiration is 18 and oxygen saturation is 98%.   Lungs are clear to auscultation bilaterally. Heart has regular rate and rhythm. No palpable cervical, supraclavicular, or axillary adenopathy. Abdomen soft, non-tender, normal bowel sounds. On pelvic examination the external genitalia were unremarkable. A speculum exam was performed. There are  no mucosal lesions noted in the vaginal vault. On bimanual  examination there were no pelvic masses appreciated.  Vaginal cuff intact.  Lab Findings: Lab Results  Component Value Date   WBC 4.6 03/24/2020   HGB 11.0 (L) 03/24/2020   HCT 33.5 (L) 03/24/2020   MCV 90.8 03/24/2020   PLT 247 03/24/2020    Radiographic Findings: CT Abdomen Pelvis W Contrast  Result Date: 03/30/2020 CLINICAL DATA:  Uterine/cervical cancer, surveillance f/u after post-treatment scan. Endometrial ca; chemo/xrt comp 01/2020; lt pelvic lymphedema; no other c/o EXAM: CT ABDOMEN AND PELVIS WITH CONTRAST TECHNIQUE: Multidetector CT imaging of the abdomen and pelvis was performed using the standard protocol following bolus administration of intravenous contrast. CONTRAST:  71mL OMNIPAQUE IOHEXOL 300 MG/ML  SOLN COMPARISON:  CT abdomen pelvis 12/28/2019 FINDINGS: Lower chest: Central venous catheter terminating at the superior cavoatrial junction. Mild mitral annular calcifications. No acute abnormality. Redemonstration of a small, likely paraesophageal type, hiatal hernia. Hepatobiliary: Caudate lobe is again noted to be prominent in size. No focal liver abnormality. No gallstones, gallbladder wall thickening, or pericholecystic fluid. No biliary dilatation. Pancreas: No focal lesion. Normal pancreatic contour. No surrounding inflammatory changes. No main pancreatic ductal dilatation. Spleen: Vague subcentimeter hypodensity is again noted. Normal in size without new focal abnormality. Adrenals/Urinary Tract: No adrenal nodule bilaterally. Bilateral kidneys enhance symmetrically. No hydronephrosis. No hydroureter. The urinary bladder is only partially visualized and grossly unremarkable. Stomach/Bowel: PO contrast reaches the distal ileum. Stomach is within normal limits. No evidence of bowel wall thickening or dilatation. Appendix appears normal. Vascular/Lymphatic: No abdominal aorta or iliac aneurysm. Mild atherosclerotic plaque  of the aorta and its branches. Interval development of a prominent but nonenlarged retroperitoneal lymph node measuring up to 0.8 cm (from 0.5 cm) this at the level of the inferior renal poles. Stable prominent but not enlarged bilateral [place] lymph nodes measuring up to 0.9 cm on the left (3:85) and 0.6 cm on the right (3:84). Otherwise no abdominal or pelvic lymphadenopathy. Reproductive: Limited evaluation of the pelvic region due to streak artifact originating from left femoral surgical hardware. Post hysterectomy. No adnexal masses. Other: No intraperitoneal free fluid. No intraperitoneal free gas. No organized fluid collection. Musculoskeletal: Small fat containing umbilical hernia with abdominal defect of 1.6 cm. No suspicious lytic or blastic osseous lesions. No acute displaced fracture. Multilevel degenerative changes of the spine. Status post total left arthroplasty that is only partially visualized. IMPRESSION: 1. No definite recurrent or metastatic disease in a patient with history of uterine/cervical cancer. Please note limited evaluation of the pelvis due to streak artifact originating from left femoral surgical hardware. 2. Interval development of a nonspecific prominent 0.8 cm retroperitoneal lymph node. Attention on follow-up. 3. Stable prominent bilateral [place] lymph nodes measuring up to 0.9 cm on the left. Attention on follow-up. 4. Small hiatal hernia, likely of the paraesophageal type. 5. Scattered colonic diverticulosis with no acute diverticulitis. 6.  Aortic Atherosclerosis (ICD10-I70.0). Electronically Signed   By: Thomasena Edis  Mckinley Jewel M.D.   On: 03/30/2020 15:42    Impression: Stage IIIC1grade 1 endometrioid endometrial adenocarcinoma  No evidence of recurrence on clinical exam today.   Plan: The patient is scheduled to follow-up with Dr. Alvy Bimler on 12/29/2020. She will follow up with Dr. Berline Lopes in three months and with radiation oncology in six months.  Total time spent in this  encounter was 25 minutes which included reviewing the patient's most recent chemotherapy, follow-ups, CT scans of abdomen and pelvis, physical examination, and documentation.  ___________________________________   [name], PhD, MD  This document serves as a record of services personally performed by Gery Pray, MD. It was created on his behalf by Clerance Lav, a trained medical scribe. The creation of this record is based on the scribe's personal observations and the provider's statements to them. This document has been checked and approved by the attending provider.

## 2020-04-18 DIAGNOSIS — D128 Benign neoplasm of rectum: Secondary | ICD-10-CM | POA: Diagnosis not present

## 2020-04-18 DIAGNOSIS — K635 Polyp of colon: Secondary | ICD-10-CM | POA: Diagnosis not present

## 2020-04-18 DIAGNOSIS — K5730 Diverticulosis of large intestine without perforation or abscess without bleeding: Secondary | ICD-10-CM | POA: Diagnosis not present

## 2020-04-18 DIAGNOSIS — K648 Other hemorrhoids: Secondary | ICD-10-CM | POA: Diagnosis not present

## 2020-04-18 DIAGNOSIS — Z86010 Personal history of colonic polyps: Secondary | ICD-10-CM | POA: Diagnosis not present

## 2020-04-18 DIAGNOSIS — D123 Benign neoplasm of transverse colon: Secondary | ICD-10-CM | POA: Diagnosis not present

## 2020-04-20 ENCOUNTER — Other Ambulatory Visit: Payer: Self-pay

## 2020-04-20 ENCOUNTER — Inpatient Hospital Stay: Payer: Medicare Other | Attending: Hematology and Oncology

## 2020-04-20 DIAGNOSIS — Z452 Encounter for adjustment and management of vascular access device: Secondary | ICD-10-CM | POA: Insufficient documentation

## 2020-04-20 DIAGNOSIS — C541 Malignant neoplasm of endometrium: Secondary | ICD-10-CM | POA: Insufficient documentation

## 2020-04-20 DIAGNOSIS — K635 Polyp of colon: Secondary | ICD-10-CM | POA: Diagnosis not present

## 2020-04-20 DIAGNOSIS — D128 Benign neoplasm of rectum: Secondary | ICD-10-CM | POA: Diagnosis not present

## 2020-04-20 MED ORDER — SODIUM CHLORIDE 0.9% FLUSH
10.0000 mL | Freq: Once | INTRAVENOUS | Status: AC
  Administered 2020-04-20: 10 mL
  Filled 2020-04-20: qty 10

## 2020-04-20 MED ORDER — HEPARIN SOD (PORK) LOCK FLUSH 100 UNIT/ML IV SOLN
500.0000 [IU] | Freq: Once | INTRAVENOUS | Status: AC
  Administered 2020-04-20: 500 [IU]
  Filled 2020-04-20: qty 5

## 2020-05-17 ENCOUNTER — Ambulatory Visit: Payer: Medicare Other | Admitting: Gynecologic Oncology

## 2020-05-18 ENCOUNTER — Telehealth: Payer: Self-pay | Admitting: *Deleted

## 2020-05-18 NOTE — Telephone Encounter (Signed)
CALLED PATIENT TO INFORM OF FU WITH DR. Berline Lopes ON 06-17-20 - ARRIVAL TIME- 12:15 PM, SPOKE WITH PATIENT AND SHE IS AWARE OF THIS APPT.

## 2020-05-18 NOTE — Telephone Encounter (Signed)
Enid Derry called and scheduled a follow up appt for March. Appt scheduled and port flush moved to 3/4. Enid Derry will contact the patient with appts

## 2020-06-03 ENCOUNTER — Telehealth: Payer: Self-pay | Admitting: *Deleted

## 2020-06-03 NOTE — Telephone Encounter (Signed)
Scheduled the patient for a lab, port flush and CT scan. Called the patient with the appts; patient moved MD visit from 3/4 to 3/9

## 2020-06-08 ENCOUNTER — Inpatient Hospital Stay: Payer: Medicare Other | Attending: Hematology and Oncology

## 2020-06-08 ENCOUNTER — Other Ambulatory Visit: Payer: Self-pay

## 2020-06-08 ENCOUNTER — Inpatient Hospital Stay: Payer: Medicare Other

## 2020-06-08 DIAGNOSIS — C541 Malignant neoplasm of endometrium: Secondary | ICD-10-CM | POA: Diagnosis not present

## 2020-06-08 LAB — COMPREHENSIVE METABOLIC PANEL
ALT: 13 U/L (ref 0–44)
AST: 14 U/L — ABNORMAL LOW (ref 15–41)
Albumin: 3.9 g/dL (ref 3.5–5.0)
Alkaline Phosphatase: 36 U/L — ABNORMAL LOW (ref 38–126)
Anion gap: 8 (ref 5–15)
BUN: 17 mg/dL (ref 8–23)
CO2: 27 mmol/L (ref 22–32)
Calcium: 9.4 mg/dL (ref 8.9–10.3)
Chloride: 103 mmol/L (ref 98–111)
Creatinine, Ser: 1.53 mg/dL — ABNORMAL HIGH (ref 0.44–1.00)
GFR, Estimated: 35 mL/min — ABNORMAL LOW (ref >60)
Glucose, Bld: 97 mg/dL (ref 70–99)
Potassium: 3.7 mmol/L (ref 3.5–5.1)
Sodium: 138 mmol/L (ref 135–145)
Total Bilirubin: 0.5 mg/dL (ref 0.3–1.2)
Total Protein: 6.7 g/dL (ref 6.5–8.1)

## 2020-06-08 LAB — CBC WITH DIFFERENTIAL/PLATELET
Abs Immature Granulocytes: 0.03 10*3/uL (ref 0.00–0.07)
Basophils Absolute: 0.1 10*3/uL (ref 0.0–0.1)
Basophils Relative: 1 %
Eosinophils Absolute: 0.4 10*3/uL (ref 0.0–0.5)
Eosinophils Relative: 8 %
HCT: 35.2 % — ABNORMAL LOW (ref 36.0–46.0)
Hemoglobin: 11.4 g/dL — ABNORMAL LOW (ref 12.0–15.0)
Immature Granulocytes: 1 %
Lymphocytes Relative: 25 %
Lymphs Abs: 1.3 10*3/uL (ref 0.7–4.0)
MCH: 29.2 pg (ref 26.0–34.0)
MCHC: 32.4 g/dL (ref 30.0–36.0)
MCV: 90 fL (ref 80.0–100.0)
Monocytes Absolute: 0.6 10*3/uL (ref 0.1–1.0)
Monocytes Relative: 11 %
Neutro Abs: 2.8 10*3/uL (ref 1.7–7.7)
Neutrophils Relative %: 54 %
Platelets: 226 10*3/uL (ref 150–400)
RBC: 3.91 MIL/uL (ref 3.87–5.11)
RDW: 13.7 % (ref 11.5–15.5)
WBC: 5.1 10*3/uL (ref 4.0–10.5)
nRBC: 0 % (ref 0.0–0.2)

## 2020-06-08 MED ORDER — SODIUM CHLORIDE 0.9% FLUSH
10.0000 mL | Freq: Once | INTRAVENOUS | Status: AC
  Administered 2020-06-08: 10 mL
  Filled 2020-06-08: qty 10

## 2020-06-08 MED ORDER — HEPARIN SOD (PORK) LOCK FLUSH 100 UNIT/ML IV SOLN
500.0000 [IU] | Freq: Once | INTRAVENOUS | Status: AC
  Administered 2020-06-08: 500 [IU]
  Filled 2020-06-08: qty 5

## 2020-06-08 NOTE — Patient Instructions (Signed)
Implanted Port Insertion, Care After This sheet gives you information about how to care for yourself after your procedure. Your health care provider may also give you more specific instructions. If you have problems or questions, contact your health care provider. What can I expect after the procedure? After the procedure, it is common to have:  Discomfort at the port insertion site.  Bruising on the skin over the port. This should improve over 3-4 days. Follow these instructions at home: Port care  After your port is placed, you will get a manufacturer's information card. The card has information about your port. Keep this card with you at all times.  Take care of the port as told by your health care provider. Ask your health care provider if you or a family member can get training for taking care of the port at home. A home health care nurse may also take care of the port.  Make sure to remember what type of port you have. Incision care  Follow instructions from your health care provider about how to take care of your port insertion site. Make sure you: ? Wash your hands with soap and water before and after you change your bandage (dressing). If soap and water are not available, use hand sanitizer. ? Change your dressing as told by your health care provider. ? Leave stitches (sutures), skin glue, or adhesive strips in place. These skin closures may need to stay in place for 2 weeks or longer. If adhesive strip edges start to loosen and curl up, you may trim the loose edges. Do not remove adhesive strips completely unless your health care provider tells you to do that.  Check your port insertion site every day for signs of infection. Check for: ? Redness, swelling, or pain. ? Fluid or blood. ? Warmth. ? Pus or a bad smell.      Activity  Return to your normal activities as told by your health care provider. Ask your health care provider what activities are safe for you.  Do not  lift anything that is heavier than 10 lb (4.5 kg), or the limit that you are told, until your health care provider says that it is safe. General instructions  Take over-the-counter and prescription medicines only as told by your health care provider.  Do not take baths, swim, or use a hot tub until your health care provider approves. Ask your health care provider if you may take showers. You may only be allowed to take sponge baths.  Do not drive for 24 hours if you were given a sedative during your procedure.  Wear a medical alert bracelet in case of an emergency. This will tell any health care providers that you have a port.  Keep all follow-up visits as told by your health care provider. This is important. Contact a health care provider if:  You cannot flush your port with saline as directed, or you cannot draw blood from the port.  You have a fever or chills.  You have redness, swelling, or pain around your port insertion site.  You have fluid or blood coming from your port insertion site.  Your port insertion site feels warm to the touch.  You have pus or a bad smell coming from the port insertion site. Get help right away if:  You have chest pain or shortness of breath.  You have bleeding from your port that you cannot control. Summary  Take care of the port as told by your   health care provider. Keep the manufacturer's information card with you at all times.  Change your dressing as told by your health care provider.  Contact a health care provider if you have a fever or chills or if you have redness, swelling, or pain around your port insertion site.  Keep all follow-up visits as told by your health care provider. This information is not intended to replace advice given to you by your health care provider. Make sure you discuss any questions you have with your health care provider. Document Revised: 10/29/2017 Document Reviewed: 10/29/2017 Elsevier Patient Education   2021 [employer]  

## 2020-06-13 ENCOUNTER — Telehealth: Payer: Self-pay | Admitting: Gynecologic Oncology

## 2020-06-13 ENCOUNTER — Ambulatory Visit (HOSPITAL_COMMUNITY)
Admission: RE | Admit: 2020-06-13 | Discharge: 2020-06-13 | Disposition: A | Discharge status: Home / Self Care | Payer: Medicare Other | Source: Ambulatory Visit | Attending: Gynecologic Oncology | Admitting: Gynecologic Oncology

## 2020-06-13 ENCOUNTER — Encounter (HOSPITAL_COMMUNITY): Payer: Self-pay

## 2020-06-13 ENCOUNTER — Other Ambulatory Visit: Payer: Self-pay

## 2020-06-13 DIAGNOSIS — C541 Malignant neoplasm of endometrium: Secondary | ICD-10-CM | POA: Diagnosis not present

## 2020-06-13 DIAGNOSIS — K760 Fatty (change of) liver, not elsewhere classified: Secondary | ICD-10-CM | POA: Diagnosis not present

## 2020-06-13 MED ORDER — IOHEXOL 300 MG/ML  SOLN
100.0000 mL | Freq: Once | INTRAMUSCULAR | Status: AC | PRN
  Administered 2020-06-13: 80 mL via INTRAVENOUS

## 2020-06-13 NOTE — Telephone Encounter (Signed)
Called patient, reviewed CT results. Stable sub 1cm retroperitoneal adenopathy. Overall reassuring. Patient aware of f/u in the next couple of weeks.  Jeral Pinch MD Gynecologic Oncology

## 2020-06-17 ENCOUNTER — Ambulatory Visit: Payer: Medicare Other | Admitting: Gynecologic Oncology

## 2020-06-21 ENCOUNTER — Encounter: Payer: Self-pay | Admitting: Gynecologic Oncology

## 2020-06-22 ENCOUNTER — Encounter: Payer: Self-pay | Admitting: Gynecologic Oncology

## 2020-06-22 ENCOUNTER — Inpatient Hospital Stay: Payer: Medicare Other | Attending: Hematology and Oncology | Admitting: Gynecologic Oncology

## 2020-06-22 ENCOUNTER — Ambulatory Visit: Payer: Medicare Other | Admitting: Gynecologic Oncology

## 2020-06-22 ENCOUNTER — Other Ambulatory Visit: Payer: Self-pay

## 2020-06-22 VITALS — BP 147/69 | HR 83 | Temp 97.8°F | Resp 20 | Wt 240.9 lb

## 2020-06-22 DIAGNOSIS — C541 Malignant neoplasm of endometrium: Secondary | ICD-10-CM | POA: Diagnosis not present

## 2020-06-22 DIAGNOSIS — Z9221 Personal history of antineoplastic chemotherapy: Secondary | ICD-10-CM | POA: Diagnosis not present

## 2020-06-22 DIAGNOSIS — Z6841 Body Mass Index (BMI) 40.0 and over, adult: Secondary | ICD-10-CM | POA: Diagnosis not present

## 2020-06-22 NOTE — Patient Instructions (Signed)
You exam was normal today! Please call me if you develop any symptoms that we talked about today (like vaginal bleeding). Keep trying to use your dilator (at least once a week).   I will see you in September. When you come to see Dr. Sondra Come, please ask them to help get you scheduled with me.

## 2020-06-22 NOTE — Progress Notes (Signed)
Gynecologic Oncology Return Clinic Visit  06/22/20  Reason for Visit: surveillance visit in the setting of advanced stage endometrial cancer  Treatment History: Oncology History Overview Note  Endometrioid, MSI High, MLH1 promoter hypermethylation present   Endometrial cancer ([place])  05/18/2019 Initial Biopsy   EMB: Complex atypical hyperplasia, cannot rule out microscopic focus of FIGO grade 1 carcinoma   05/18/2019 Initial Diagnosis   Presented in February 2021 with postmenopausal bleeding.  She was started on Provera and underwent endometrial biopsy showing complex atypical hyperplasia with a possible focus suspicious for grade 1 endometrial cancer.   05/21/2019 Imaging   Pelvic ultrasound at [facility]: Anteverted uterus measuring 8.2 cm in greatest dimension.  Endometrial lining measures 1.73 cm and is thickened.  Left ovary not visualized, right ovary normal in appearance.   06/30/2019 Surgery   Robotic-assisted laparoscopic total hysterectomy with bilateral salpingoophorectomy, bilateral pelvic LND  Frozen section showed endometrial cancer with more than 50% myometrial invasion.  Patient did not map to a sentinel lymph node bilaterally.   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  Tumor measures 3.5 cm and invades focally into the serosa.  No involvement of the cervix or bilateral adnexa.  1 of 20 lymph nodes positive for metastatic carcinoma, focus measures 1 cm without evidence of extracapsular extension.  FINAL MICROSCOPIC DIAGNOSIS: A. UTERUS, CERVIX, BILATERAL FALLOPIAN TUBES AND OVARIES, HYSTERECTOMY WITH SALPINGECTOMY: - Endometrioid adenocarcinoma, 3.5 cm, FIGO grade 1 - Carcinoma invades through the entire myometrium and focally into the serosal surface - Benign unremarkable cervix - Benign unremarkable bilateral fallopian tubes and left ovary - Benign serous inclusion cyst of right ovary - See oncology table B. LYMPH NODE,  RIGHT PELVIC, BIOPSY: - Metastatic carcinoma to one of twelve lymph nodes (1/12) - Focus of metastatic carcinoma measures 1.0 cm, without evidence of extracapsular extension C. LYMPH NODE, LEFT PELVIC, BIOPSY: - Eight benign lymph nodes (0/8) ONCOLOGY TABLE: UTERUS, CARCINOMA OR CARCINOSARCOMA Procedure: Total hysterectomy and bilateral salpingo-oophorectomy Histologic type: Endometrioid adenocarcinoma Histologic Grade: FIGO grade 1 Myometrial invasion: Depth of invasion: 30 mm Myometrial thickness: 30 mm Uterine Serosa Involvement: Present, focal Cervical stromal involvement: Not identified Extent of involvement of other organs: Not identified Lymphovascular invasion: Not identified Regional Lymph Nodes: Examined: 0 Sentinel 20 Non-sentinel 20 Total Lymph nodes with metastasis: 1 Isolated tumor cells (<0.2 mm): 0 Micrometastasis: (>0.2 mm and < 2.0 mm): 0 Macrometastasis: (>2.0 mm): 1 Extracapsular extension: Not identified Representative Tumor Block: A6 MMR / MSI testing: Will be ordered Pathologic Stage Classification (pTNM, AJCC 8th edition): pT3, pN1a Comments: None Mismatch Repair Protein (IHC) SUMMARY INTERPRETATION: ABNORMAL 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 The loss of expression may be secondary to promoter hyper-methylation, gene mutation or other genetic event. BRAF mutation testing and/or MLH1 methylation testing is indicated. The presence of a BRAF mutation and/or MLH1 hypermethylation is indicative of a sporadic-type tumor. The absence of either BRAF mutation and/or presence of normal methylation indicate the possible presence of a hereditary germline mutation (e.g.Lynch syndrome) and referral to genetic counseling is warranted. It is recommended that the loss of protein expression be correlated with molecular based MSI testing   06/30/2019 Cancer Staging   Staging form: Corpus Uteri - Carcinoma and Carcinosarcoma, AJCC 8th Edition -  Clinical stage from 06/30/2019: FIGO Stage IIIC1 (cT3, cN1a, cM0) - Signed by Lafonda Mosses, MD on 07/09/2019   07/16/2019 Imaging   1. No evidence of  metastatic disease in the chest, abdomen or pelvis. 2. Status post hysterectomy. Small volume simple ascites. Small amount of ill-defined fluid in the pelvic sidewalls, compatible with recent surgery. 3. Small umbilical hernia containing fat and trace ascitic fluid. 4. Chronic findings include: Aortic Atherosclerosis (ICD10-I70.0). Small hiatal hernia. Mild sigmoid diverticulosis   07/29/2019 Procedure   Ultrasound and fluoroscopically guided right internal jugular single lumen power port catheter insertion. Tip in the SVC/RA junction. Catheter ready for use.   08/03/2019 - 12/02/2019 Chemotherapy   The patient had carboplatin and taxol for chemotherapy treatment.     12/29/2019 Imaging   1. Enlargement of a left external iliac node, borderline sized. This could be reactive or represent early/isolated nodal metastasis. 2. Otherwise, no evidence of metastatic disease in the abdomen or pelvis. 3. Relative hyperattenuation surrounding the gallbladder is favored to be related to hepatic steatosis and sparing. Recommend attention on follow-up. 4. Degraded evaluation of the pelvis, secondary to beam hardening artifact from left hip arthroplasty. 5. Small hiatal hernia. 6. Decrease in trace pelvic fluid. 7. Aortic Atherosclerosis (ICD10-I70.0).   03/30/2020 Imaging   CT A/P: 1. No definite recurrent or metastatic disease in a patient with history of uterine/cervical cancer. Please note limited evaluation of the pelvis due to streak artifact originating from left femoral surgical hardware. 2. Interval development of a nonspecific prominent 0.8 cm retroperitoneal lymph node. Attention on follow-up. 3. Stable prominent bilateral [place] lymph nodes measuring up to 0.9 cm on the left. Attention on follow-up. 4. Small hiatal hernia, likely of the  paraesophageal type. 5. Scattered colonic diverticulosis with no acute diverticulitis. 6.  Aortic Atherosclerosis (ICD10-I70.0).   06/13/2020 Imaging   CT A/P: No evidence of recurrent or metastatic disease.   Stable left retroperitoneal and left external iliac lymph node. No new or enlarging lymph nodes.   Hepatic steatosis.   Aortic atherosclerosis.     Interval History: Patient presents today for surveillance visit.  She reports overall doing very well since her last visit.  She endorses a good appetite without any nausea or emesis.  She denies any vaginal bleeding or discharge.  She has been trying to remember to use her dilator but thinks that she probably has not been using it more than once a month.  She notes normal bowel and bladder function.  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  She is also voicing continued motivation to work on weight loss.  She worked with [employer] in the past and is thinking about this or going to a Ambulance person.  Past Medical/Surgical History: Past Medical History:  Diagnosis Date  . Cataract   . Complex endometrial hyperplasia with atypia   . Dyspnea   . endometrial ca 06/2019  . Family history of colon cancer   . Family history of lung cancer   . Family history of melanoma   . Family history of prostate cancer   . GERD (gastroesophageal reflux disease)   . Heart murmur    as a child  . Hypertension   . Obesity   . Pneumonia 2018  . Post-menopausal bleeding 05/18/2019    Past Surgical History:  Procedure Laterality Date  . BIOPSY ENDOMETRIAL    . CATARACT EXTRACTION  03/2019  . ESOPHAGOGASTRODUODENOSCOPY    . IR IMAGING GUIDED PORT INSERTION  07/29/2019  . ROBOTIC ASSISTED TOTAL HYSTERECTOMY WITH BILATERAL SALPINGO OOPHERECTOMY Bilateral 06/30/2019  Procedure: XI ROBOTIC ASSISTED TOTAL HYSTERECTOMY WITH BILATERAL SALPINGO  OOPHORECTOMY, BILATERAL LYMPH NODE DISSECTION;  Surgeon: Lafonda Mosses, MD;  Location: [facility];  Service: Gynecology;  Laterality: Bilateral;  . SENTINEL NODE BIOPSY N/A 06/30/2019   Procedure: SENTINEL LYMPH NODE BIOPSY;  Surgeon: Lafonda Mosses, MD;  Location: [facility];  Service: Gynecology;  Laterality: N/A;  . TONSILLECTOMY    . TOTAL HIP ARTHROPLASTY Left   . WISDOM TOOTH EXTRACTION      Family History  Problem Relation Age of Onset  . Colon cancer Mother 71       treated with surgery only  . Lung cancer Mother        dx in her mid-47s  . CVA Father   . Melanoma Sister        dx <60  . Cancer Maternal Aunt        unknown type, dx >50  . Melanoma Nephew        dx in his 47s  . Endometrial cancer Neg Hx   . Ovarian cancer Neg Hx   . Breast cancer Neg Hx     Social History   Socioeconomic History  . Marital status: Divorced    Spouse name: Not on file  . Number of children: Not on file  . Years of education: Not on file  . Highest education level: Not on file  Occupational History  . Not on file  Tobacco Use  . Smoking status: Never Smoker  . Smokeless tobacco: Never Used  Vaping Use  . Vaping Use: Never used  Substance and Sexual Activity  . Alcohol use: No  . Drug use: Never  . Sexual activity: Not Currently  Other Topics Concern  . Not on file  Social History Narrative  . Not on file   Social Determinants of Health   Financial Resource Strain: Not on file  Food Insecurity: Not on file  Transportation Needs: Not on file  Physical Activity: Not on file  Stress: Not on file  Social Connections: Not on file    Current Medications:  Current Outpatient Medications:  .  AMITIZA 24 MCG capsule, Take 48 mcg by mouth every evening. , Disp: , Rfl:  .  B Complex Vitamins (B COMPLEX PO), Take 1 Dose by mouth daily. Liquid b complex, Disp: , Rfl:  .  benazepril-hydrochlorthiazide (LOTENSIN HCT) 20-25 MG tablet, Take 1 tablet by mouth every evening. ,  Disp: , Rfl:  .  diphenhydrAMINE (BENADRYL) 25 MG tablet, Take 50 mg by mouth daily as needed for allergies., Disp: , Rfl:  .  gabapentin (NEURONTIN) 600 MG tablet, Take 600-1,200 mg by mouth See admin instructions. Take 1200 mg in the morning, 600 mg in the afternoon, and 600 mg at night, Disp: , Rfl:  .  hydrocortisone (ANUSOL-HC) 2.5 % rectal cream, 1 application, Disp: , Rfl:  .  Melatonin 5 MG CAPS, Take 5 mg by mouth at bedtime., Disp: , Rfl:  .  meloxicam (MOBIC) 15 MG tablet, Take 15 mg by mouth daily in the afternoon. , Disp: , Rfl:  .  Oxymetazoline HCl (VICKS SINEX 12 HOUR NA), Place 1 spray into the nose 2 (two) times daily as needed (congestion)., Disp: , Rfl:  .  topiramate (TOPAMAX) 25 MG tablet, Take 75 mg by mouth at bedtime., Disp: , Rfl:   Review of Systems: Denies appetite changes, fevers, chills, fatigue, unexplained weight changes. Denies hearing loss, neck lumps or masses, mouth sores, ringing in ears  or voice changes. Denies cough or wheezing.  Denies shortness of breath. Denies chest pain or palpitations. Denies leg swelling. Denies abdominal distention, pain, blood in stools, constipation, diarrhea, nausea, vomiting, or early satiety. Denies pain with intercourse, dysuria, frequency, hematuria or incontinence. Denies hot flashes, pelvic pain, vaginal bleeding or vaginal discharge.   Denies joint pain, back pain or muscle pain/cramps. Denies itching, rash, or wounds. Denies dizziness, headaches, numbness or seizures. Denies swollen lymph nodes or glands, denies easy bruising or bleeding. Denies anxiety, depression, confusion, or decreased concentration.  Physical Exam: BP (!) 147/69 (BP Location: Left Arm, Patient Position: Sitting)   Pulse 83   Temp 97.8 F (36.6 C) (Tympanic)   Resp 20   Wt 240 lb 14.4 oz (109.3 kg)   SpO2 96%   BMI 42.67 kg/m  General: Alert, oriented, no acute distress. HEENT: Normocephalic, atraumatic, sclera anicteric. Chest:  Unlabored breathing on room air. Abdomen: Obese, soft, nontender.  Normoactive bowel sounds.  No masses or hepatosplenomegaly appreciated.  Well-healed laparoscopic incisions. Extremities: Grossly normal range of motion.  Warm, well perfused.  No edema bilaterally. Skin: No rashes or lesions noted. Lymphatics: No cervical, supraclavicular, or inguinal adenopathy. GU: Normal appearing external genitalia without erythema, excoriation, or lesions.  Speculum exam reveals no bleeding or discharge, vaginal mucosa normal in appearance without masses or lesions.  Bimanual exam reveals cuff intact, no nodularity.  Rectovaginal exam confirms these findings.  Laboratory & Radiologic Studies: None new  Assessment & Plan: MATHEW STORCK is a 75 y.o. woman with with Stage IIIC1grade 1 endometrioid endometrial adenocarcinomawho completed adjuvant therapy with carboplatin and paclitaxel with vaginal brachytherapy approximately 7 months ago.  Patient is doing very well today.  She is NED on exam.  She recently had a repeat CT scan.  This shows stable left retroperitoneal and external iliac lymph nodes, no new or enlarging lymph nodes.  The somewhat prominent lymph nodes all measure less than 1 cm.  They have been stable in size over 3 scans now all performed since completion of adjuvant therapy.  I think given their stable nature and no evidence of new disease, that these are likely not pathologic.  I do not think that repeat scan in 3 months is indicated.  At her next visit in 6 months, we may proceed with an additional CT scan to assess be size and stability of these lymph nodes.  Patient continues to voice interest in weight loss.  I offered to refer her to our nutritionist.  She has previously used [employer] with good results in the past.  She is thinking about doing this versus seeing a doctor that a friend of hers met with in the last year to develop a nutritional plan.  I have also encouraged her to  increase the frequency of her vaginal dilator to help prevent agglutination of her vaginal tissue.  Per NCCN and SGO surveillance recommendations, we will continue with visits every 3 months, alternating between myself and Dr. Sondra Come.  I reviewed signs and symptoms that would be concerning for disease recurrence and the patient knows to call if she develops any of these before her next scheduled visit.  34 minutes of total time was spent for this patient encounter, including preparation, face-to-face counseling with the patient and coordination of care, and documentation of the encounter.  Jeral Pinch, MD  Division of Gynecologic Oncology  Department of Obstetrics and Gynecology  [facility]

## 2020-07-11 DIAGNOSIS — G629 Polyneuropathy, unspecified: Secondary | ICD-10-CM | POA: Diagnosis not present

## 2020-07-11 DIAGNOSIS — F32A Depression, unspecified: Secondary | ICD-10-CM | POA: Diagnosis not present

## 2020-07-11 DIAGNOSIS — F419 Anxiety disorder, unspecified: Secondary | ICD-10-CM | POA: Diagnosis not present

## 2020-08-10 ENCOUNTER — Other Ambulatory Visit: Payer: Self-pay

## 2020-08-10 ENCOUNTER — Inpatient Hospital Stay: Payer: Medicare Other | Attending: Hematology and Oncology

## 2020-08-10 DIAGNOSIS — C541 Malignant neoplasm of endometrium: Secondary | ICD-10-CM | POA: Insufficient documentation

## 2020-08-10 DIAGNOSIS — Z452 Encounter for adjustment and management of vascular access device: Secondary | ICD-10-CM | POA: Diagnosis not present

## 2020-08-10 MED ORDER — HEPARIN SOD (PORK) LOCK FLUSH 100 UNIT/ML IV SOLN
500.0000 [IU] | Freq: Once | INTRAVENOUS | Status: AC
  Administered 2020-08-10: 500 [IU]
  Filled 2020-08-10: qty 5

## 2020-08-10 MED ORDER — SODIUM CHLORIDE 0.9% FLUSH
10.0000 mL | Freq: Once | INTRAVENOUS | Status: AC
  Administered 2020-08-10: 10 mL
  Filled 2020-08-10: qty 10

## 2020-08-10 NOTE — Patient Instructions (Signed)
Implanted Port Insertion, Care After This sheet gives you information about how to care for yourself after your procedure. Your health care provider may also give you more specific instructions. If you have problems or questions, contact your health care provider. What can I expect after the procedure? After the procedure, it is common to have:  Discomfort at the port insertion site.  Bruising on the skin over the port. This should improve over 3-4 days. Follow these instructions at home: Port care  After your port is placed, you will get a manufacturer's information card. The card has information about your port. Keep this card with you at all times.  Take care of the port as told by your health care provider. Ask your health care provider if you or a family member can get training for taking care of the port at home. A home health care nurse may also take care of the port.  Make sure to remember what type of port you have. Incision care  Follow instructions from your health care provider about how to take care of your port insertion site. Make sure you: ? Wash your hands with soap and water before and after you change your bandage (dressing). If soap and water are not available, use hand sanitizer. ? Change your dressing as told by your health care provider. ? Leave stitches (sutures), skin glue, or adhesive strips in place. These skin closures may need to stay in place for 2 weeks or longer. If adhesive strip edges start to loosen and curl up, you may trim the loose edges. Do not remove adhesive strips completely unless your health care provider tells you to do that.  Check your port insertion site every day for signs of infection. Check for: ? Redness, swelling, or pain. ? Fluid or blood. ? Warmth. ? Pus or a bad smell.      Activity  Return to your normal activities as told by your health care provider. Ask your health care provider what activities are safe for you.  Do not  lift anything that is heavier than 10 lb (4.5 kg), or the limit that you are told, until your health care provider says that it is safe. General instructions  Take over-the-counter and prescription medicines only as told by your health care provider.  Do not take baths, swim, or use a hot tub until your health care provider approves. Ask your health care provider if you may take showers. You may only be allowed to take sponge baths.  Do not drive for 24 hours if you were given a sedative during your procedure.  Wear a medical alert bracelet in case of an emergency. This will tell any health care providers that you have a port.  Keep all follow-up visits as told by your health care provider. This is important. Contact a health care provider if:  You cannot flush your port with saline as directed, or you cannot draw blood from the port.  You have a fever or chills.  You have redness, swelling, or pain around your port insertion site.  You have fluid or blood coming from your port insertion site.  Your port insertion site feels warm to the touch.  You have pus or a bad smell coming from the port insertion site. Get help right away if:  You have chest pain or shortness of breath.  You have bleeding from your port that you cannot control. Summary  Take care of the port as told by your   health care provider. Keep the manufacturer's information card with you at all times.  Change your dressing as told by your health care provider.  Contact a health care provider if you have a fever or chills or if you have redness, swelling, or pain around your port insertion site.  Keep all follow-up visits as told by your health care provider. This information is not intended to replace advice given to you by your health care provider. Make sure you discuss any questions you have with your health care provider. Document Revised: 10/29/2017 Document Reviewed: 10/29/2017 Elsevier Patient Education   2021 [employer]  

## 2020-09-14 DIAGNOSIS — M170 Bilateral primary osteoarthritis of knee: Secondary | ICD-10-CM | POA: Diagnosis not present

## 2020-09-21 DIAGNOSIS — M170 Bilateral primary osteoarthritis of knee: Secondary | ICD-10-CM | POA: Diagnosis not present

## 2020-09-28 DIAGNOSIS — M170 Bilateral primary osteoarthritis of knee: Secondary | ICD-10-CM | POA: Diagnosis not present

## 2020-10-05 ENCOUNTER — Inpatient Hospital Stay: Payer: Medicare Other | Attending: Hematology and Oncology

## 2020-10-05 ENCOUNTER — Encounter: Payer: Self-pay | Admitting: Radiation Oncology

## 2020-10-05 ENCOUNTER — Other Ambulatory Visit: Payer: Self-pay

## 2020-10-05 DIAGNOSIS — C541 Malignant neoplasm of endometrium: Secondary | ICD-10-CM | POA: Insufficient documentation

## 2020-10-05 DIAGNOSIS — Z452 Encounter for adjustment and management of vascular access device: Secondary | ICD-10-CM | POA: Diagnosis not present

## 2020-10-05 MED ORDER — HEPARIN SOD (PORK) LOCK FLUSH 100 UNIT/ML IV SOLN
500.0000 [IU] | Freq: Once | INTRAVENOUS | Status: AC
  Administered 2020-10-05: 500 [IU]
  Filled 2020-10-05: qty 5

## 2020-10-05 MED ORDER — SODIUM CHLORIDE 0.9% FLUSH
10.0000 mL | Freq: Once | INTRAVENOUS | Status: AC
Start: 2020-10-05 — End: 2020-10-05
  Administered 2020-10-05: 10 mL
  Filled 2020-10-05: qty 10

## 2020-10-11 DIAGNOSIS — L570 Actinic keratosis: Secondary | ICD-10-CM | POA: Diagnosis not present

## 2020-10-11 DIAGNOSIS — L905 Scar conditions and fibrosis of skin: Secondary | ICD-10-CM | POA: Diagnosis not present

## 2020-10-11 DIAGNOSIS — L821 Other seborrheic keratosis: Secondary | ICD-10-CM | POA: Diagnosis not present

## 2020-10-11 DIAGNOSIS — Z85820 Personal history of malignant melanoma of skin: Secondary | ICD-10-CM | POA: Diagnosis not present

## 2020-10-11 DIAGNOSIS — L814 Other melanin hyperpigmentation: Secondary | ICD-10-CM | POA: Diagnosis not present

## 2020-10-11 DIAGNOSIS — D225 Melanocytic nevi of trunk: Secondary | ICD-10-CM | POA: Diagnosis not present

## 2020-10-12 NOTE — Progress Notes (Signed)
Radiation Oncology         ([phone]) [phone] ________________________________  Name: Heidi Avila MRN: [id number]  Date: 10/13/2020  DOB: [birth year]  Follow-Up Visit Note  CC: [facility]  Lafonda Mosses, MD    ICD-10-CM   1. Endometrial cancer ([place])  C54.1       Diagnosis:  Stage IIIC1 grade 1 endometrioid endometrial adenocarcinoma  Interval Since Last Radiation:  1 year    Radiation Treatment Dates: 09/07/2019 through 10/14/2019 Site Technique Total Dose (Gy) Dose per Fx (Gy) Completed Fx Beam Energies  Vagina: Pelvis HDR-brachy 30/30 6 5/5 Ir-192   Narrative:  The patient returns today for routine 6 month follow-up. Since she was last seen, she received a CT of the abdomen and pelvis on 06/13/20 which showed no evidence of recurrent or metastatic disease.     The patient followed up with Dr. Berline Lopes on 07/12/20. During this visit it was noted that  the patient has been trying to remember to use her dilator but thinks that she probably has not been using it more than once a month. Otherwise, the patient seems to have been doing well with no other complaints noted.                         Allergies:  is allergic to amoxicillin and latex.  Meds: Current Outpatient Medications  Medication Sig Dispense Refill   B Complex Vitamins (B COMPLEX PO) Take 1 Dose by mouth daily. Liquid b complex     benazepril-hydrochlorthiazide (LOTENSIN HCT) 20-25 MG tablet Take 1 tablet by mouth every evening.      diphenhydrAMINE (BENADRYL) 25 MG tablet Take 50 mg by mouth daily as needed for allergies.     gabapentin (NEURONTIN) 600 MG tablet Take 600-1,200 mg by mouth See admin instructions. Take 1200 mg in the morning, 600 mg in the afternoon, and 600 mg at night     hydrocortisone (ANUSOL-HC) 2.5 % rectal cream 1 application     Melatonin 5 MG CAPS Take 5 mg by mouth at bedtime.     meloxicam (MOBIC) 15 MG tablet Take 15 mg by mouth daily in the afternoon.      Oxymetazoline HCl  (VICKS SINEX 12 HOUR NA) Place 1 spray into the nose 2 (two) times daily as needed (congestion).     topiramate (TOPAMAX) 25 MG tablet Take 75 mg by mouth at bedtime.     AMITIZA 24 MCG capsule Take 48 mcg by mouth every evening.  (Patient not taking: Reported on 10/13/2020)     No current facility-administered medications for this encounter.    Physical Findings: The patient is in no acute distress. Patient is alert and oriented.  height is 5\' 3"  (1.6 m) and weight is 233 lb 6 oz (105.9 kg). Her temporal temperature is 96.8 F (36 C) (abnormal). Her blood pressure is 142/67 (abnormal) and her pulse is 63. Her respiration is 18 and oxygen saturation is 99%. .  No significant changes. Lungs are clear to auscultation bilaterally. Heart has regular rate and rhythm. No palpable cervical, supraclavicular, or axillary adenopathy. Abdomen soft, non-tender, normal bowel sounds. GU: Normal appearing external genitalia without erythema, excoriation, or lesions.  Speculum exam reveals no bleeding or discharge, vaginal mucosa normal in appearance without masses or lesions.  Bimanual exam reveals cuff intact, no nodularity.  Rectovaginal exam confirms these findings.   Lab Findings: Lab Results  Component Value Date   WBC 5.1  06/08/2020   HGB 11.4 (L) 06/08/2020   HCT 35.2 (L) 06/08/2020   MCV 90.0 06/08/2020   PLT 226 06/08/2020    Radiographic Findings: No results found.  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  Encouraged the patient to use her vaginal dilator on a regular basis.  Plan: Routine follow-up in 6 months.  She will follow-up with Dr. Berline Lopes in 3 months.   20 minutes of total time was spent for this patient encounter, including preparation, face-to-face counseling with the patient and coordination of care, physical exam, and documentation of the encounter. ____________________________________  [name], PhD, MD   This document serves as a record of services personally performed by Gery Pray, MD. It was created on his behalf by Roney Mans, a trained medical scribe. The creation of this record is based on the scribe's personal observations and the provider's statements to them. This document has been checked and approved by the attending provider.

## 2020-10-13 ENCOUNTER — Encounter: Payer: Self-pay | Admitting: Radiation Oncology

## 2020-10-13 ENCOUNTER — Telehealth: Payer: Self-pay | Admitting: *Deleted

## 2020-10-13 ENCOUNTER — Other Ambulatory Visit: Payer: Self-pay

## 2020-10-13 ENCOUNTER — Ambulatory Visit
Admission: RE | Admit: 2020-10-13 | Discharge: 2020-10-13 | Disposition: A | Discharge status: Home / Self Care | Payer: Medicare Other | Source: Ambulatory Visit | Attending: Radiation Oncology | Admitting: Radiation Oncology

## 2020-10-13 DIAGNOSIS — Z923 Personal history of irradiation: Secondary | ICD-10-CM | POA: Insufficient documentation

## 2020-10-13 DIAGNOSIS — Z8542 Personal history of malignant neoplasm of other parts of uterus: Secondary | ICD-10-CM | POA: Insufficient documentation

## 2020-10-13 DIAGNOSIS — Z08 Encounter for follow-up examination after completed treatment for malignant neoplasm: Secondary | ICD-10-CM | POA: Diagnosis not present

## 2020-10-13 DIAGNOSIS — Z79899 Other long term (current) drug therapy: Secondary | ICD-10-CM | POA: Insufficient documentation

## 2020-10-13 DIAGNOSIS — C541 Malignant neoplasm of endometrium: Secondary | ICD-10-CM

## 2020-10-13 HISTORY — DX: Personal history of irradiation: Z92.3

## 2020-10-13 NOTE — Progress Notes (Signed)
[facility] is here today for follow up post radiation to the pelvic.  They completed their radiation on: 10/14/19  Does the patient complain of any of the following:  Pain:no  Abdominal bloating: no Diarrhea/Constipation: patient reports having constipation, patient currently taking stools softeners at times.  Nausea/Vomiting: no Vaginal Discharge: no Blood in Urine or Stool: no Urinary Issues (dysuria/incomplete emptying/ incontinence/ increased frequency/urgency): no Does patient report using vaginal dilator 2-3 times a week and/or sexually active 2-3 weeks: Patient not using dilator.  Post radiation skin changes: no   Additional comments if applicable: Vitals:   72/55/00 1110  BP: (!) 142/67  Pulse: 63  Resp: 18  Temp: (!) 96.8 F (36 C)  TempSrc: Temporal  SpO2: 99%  Weight: 233 lb 6 oz (105.9 kg)  Height: 5\' 3"  (1.6 m)

## 2020-10-13 NOTE — Telephone Encounter (Signed)
Enid Derry from radiation called and scheduled a follow up appt for Sept. Enid Derry will contact the patient

## 2020-10-13 NOTE — Telephone Encounter (Signed)
Called patient to inform of fu appt. with Dr. Denman George on 01-04-21 - arrival time- 1:30 pm, lvm for a return call

## 2020-10-14 ENCOUNTER — Encounter: Payer: Self-pay | Admitting: Hematology and Oncology

## 2020-10-22 IMAGING — CT CT CHEST W/ CM
2 of 5 series · 13 of 36 positions shown, 16 images · IV contrast (APPLIED)
Comparison: 06/25/2019 chest radiograph.

CLINICAL DATA: Recent diagnosis of endometrial cancer, status post
TAHBSO 06/30/2019. Staging evaluation.

EXAM:
CT CHEST, ABDOMEN, AND PELVIS WITH CONTRAST
TECHNIQUE: Multidetector CT imaging of the chest, abdomen and pelvis was
performed following the standard protocol during bolus
administration of intravenous contrast.
CONTRAST:  80mL OMNIPAQUE IOHEXOL 300 MG/ML  SOLN

[Series 2: cap with · axial · 0.85mm/px · z∈[-47,+458]mm · 10 of 125 slices shown, 13 images]
[im 12/125  mediastinal]
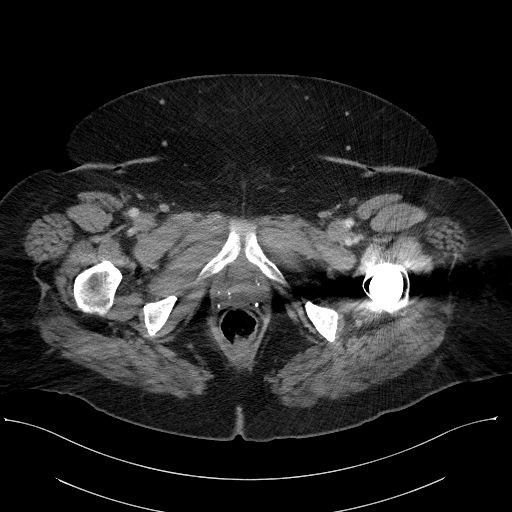
[im 12/125  lung]
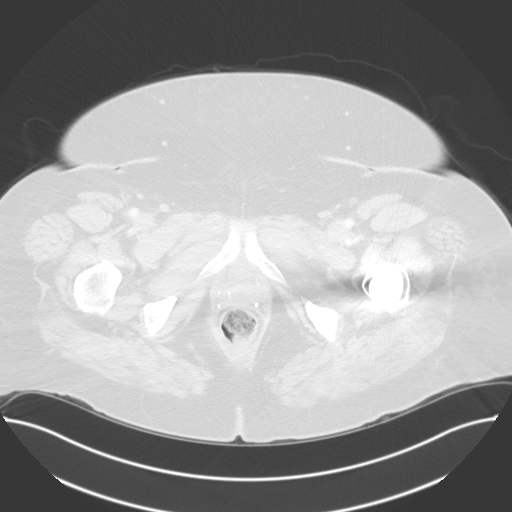
[im 23/125  lung]
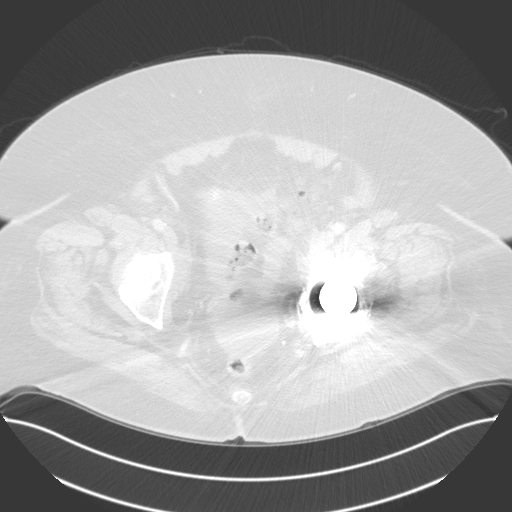
[im 34/125  lung]
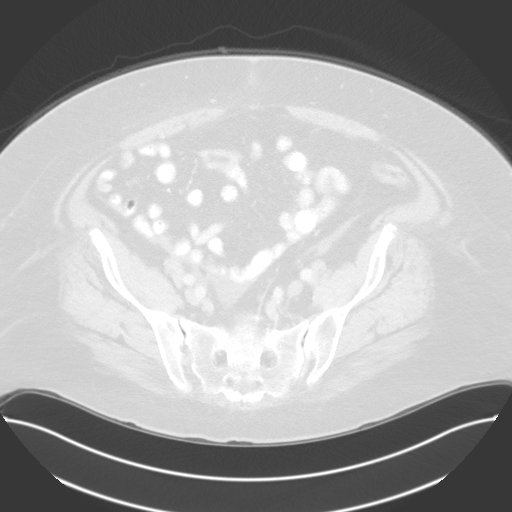
[im 46/125  lung]
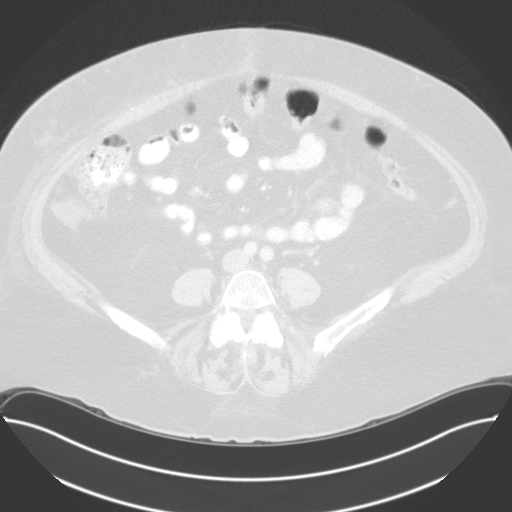
[im 57/125  mediastinal]
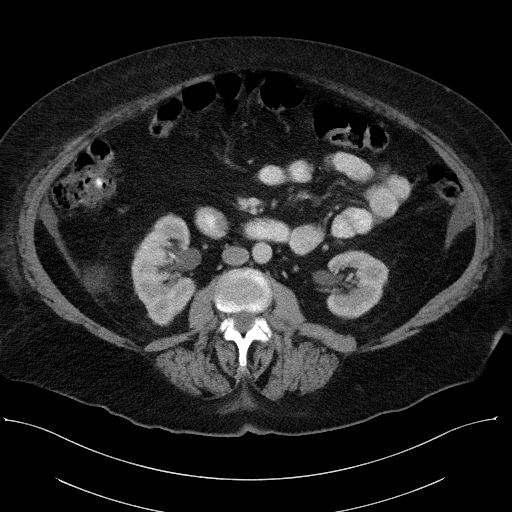
[im 57/125  lung]
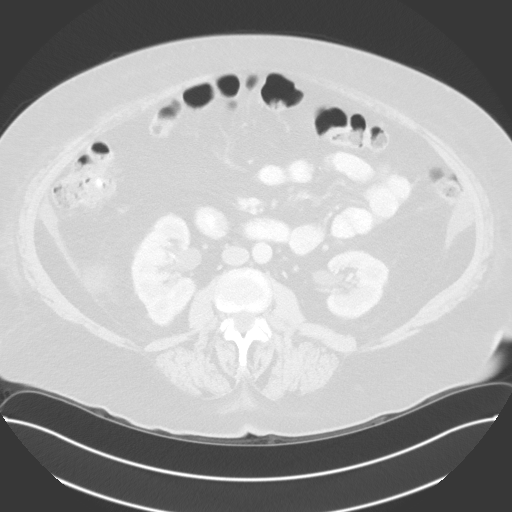
[im 68/125  lung]
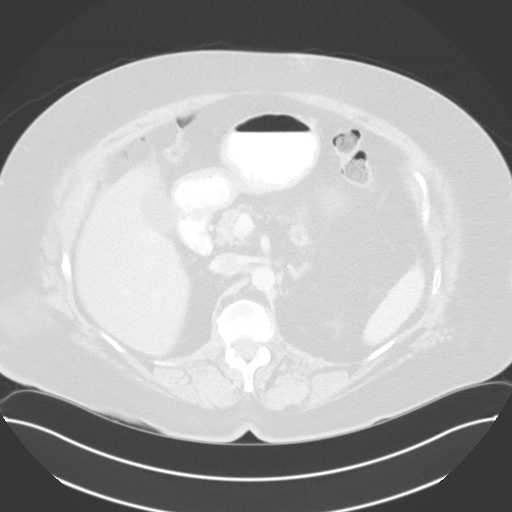
[im 79/125  lung]
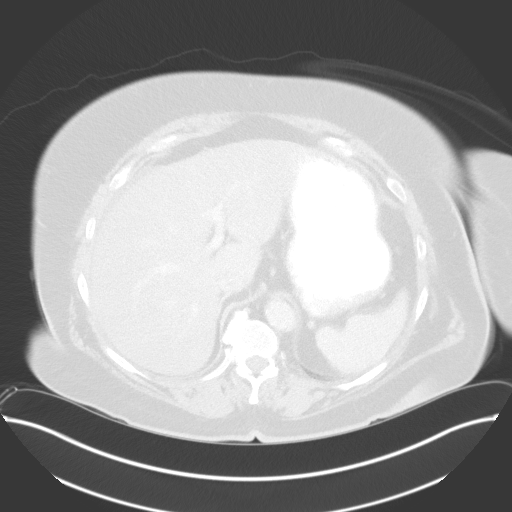
[im 91/125  lung]
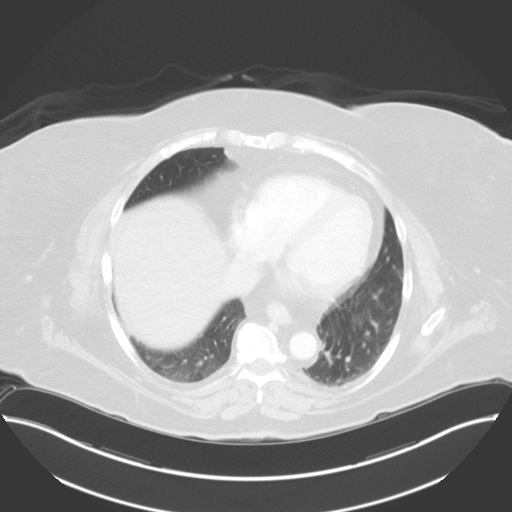
[im 102/125  mediastinal]
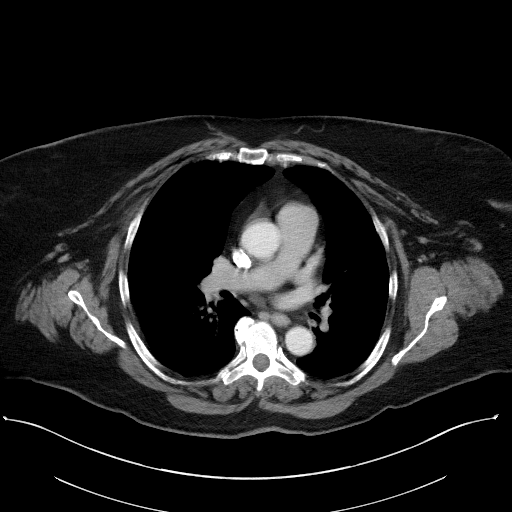
[im 102/125  lung]
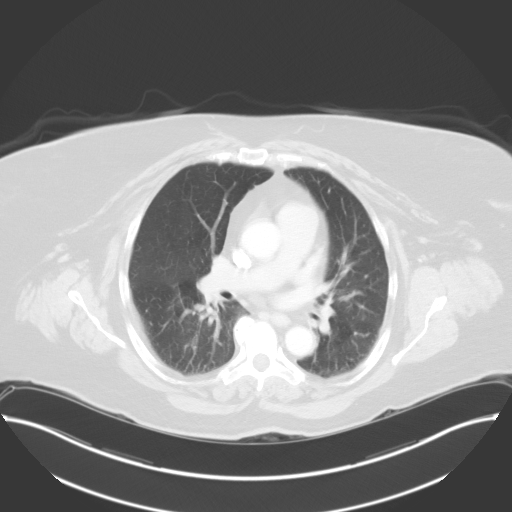
[im 113/125  lung]
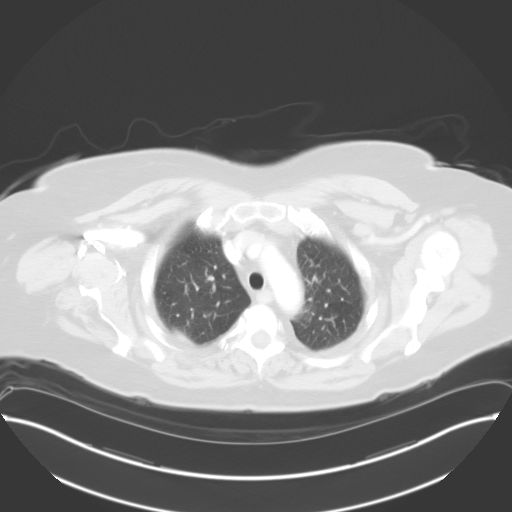

[Series 4: coronals · coronal · 0.89mm/px · 3 of 176 slices shown]
[im 36/176  lung]
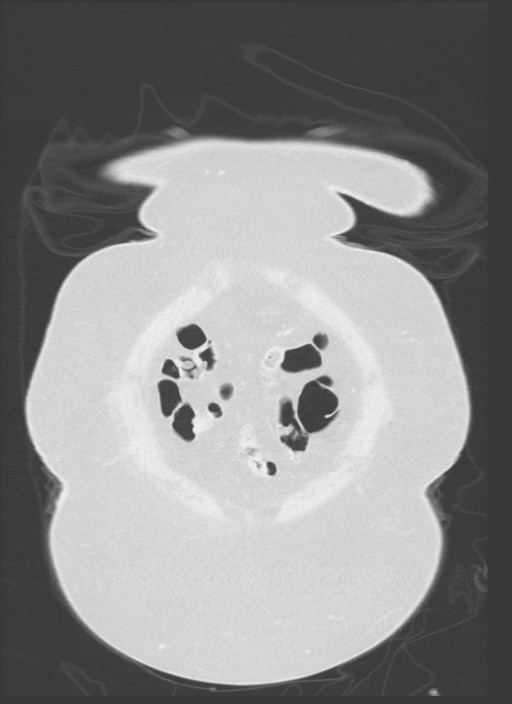
[im 71/176  lung]
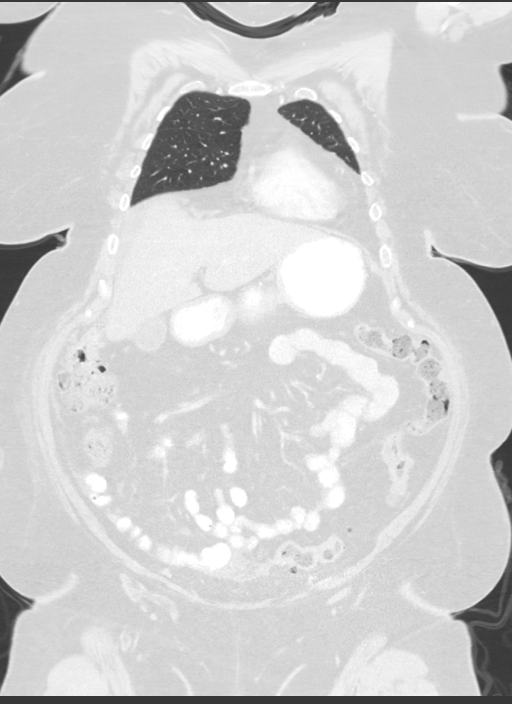
[im 106/176  lung]
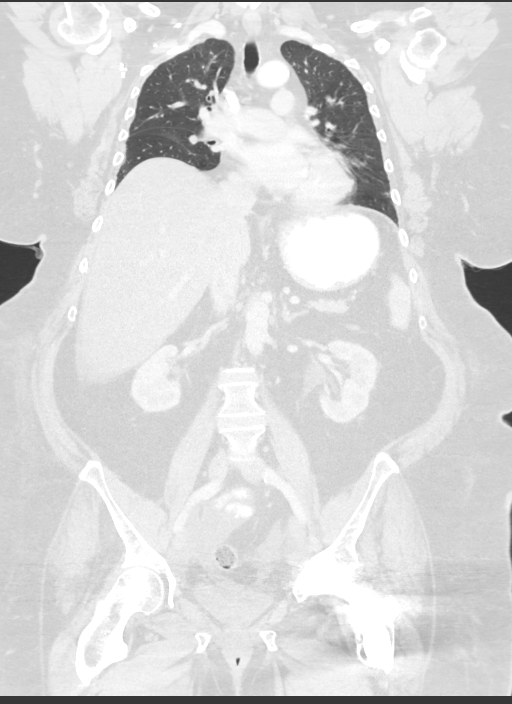

[13 of 36 positions shown; findings below may reference images not displayed]

FINDINGS: CT CHEST FINDINGS

Cardiovascular: Normal heart size. No significant pericardial
effusion/thickening. Great vessels are normal in course and caliber.
No central pulmonary emboli.

Mediastinum/Nodes: No discrete thyroid nodules. Unremarkable
esophagus. No pathologically enlarged axillary, mediastinal or hilar
lymph nodes.

Lungs/Pleura: No pneumothorax. No pleural effusion. No acute
consolidative airspace disease, lung masses or significant pulmonary
nodules.

Upper abdomen: No acute abnormality.

Musculoskeletal: No aggressive appearing focal osseous lesions. Mild
thoracic spondylosis.

CT ABDOMEN PELVIS FINDINGS

Hepatobiliary: Normal liver with no liver mass. Normal gallbladder
with no radiopaque cholelithiasis. No biliary ductal dilatation.

Pancreas: Normal, with no mass or duct dilation.

Spleen: Normal size. No mass.

Adrenals/Urinary Tract: Normal adrenals. Subcentimeter hypodense
renal cortical lesion in the lower right kidney, too small to
characterize, requiring no follow-up. Otherwise normal kidneys, with
no hydronephrosis. Nondistended bladder obscured by streak artifact
from left hip hardware. Bladder is grossly normal.

Stomach/Bowel: Small hiatal hernia. Otherwise normal stomach. Normal
caliber small bowel with no small bowel wall thickening. Candidate
normal appendix. Mild sigmoid diverticulosis, with no large bowel
wall thickening or significant pericolonic fat stranding. Oral
contrast transits to the right colon.

Vascular/Lymphatic: Mildly atherosclerotic nonaneurysmal abdominal
aorta. Patent portal, splenic, hepatic and renal veins. No
pathologically enlarged lymph nodes in the abdomen or pelvis.

Reproductive: Status post hysterectomy. Hysterectomy margin obscured
by streak artifact, with no discrete mass or fluid collection. No
adnexal masses.

Other: No pneumoperitoneum. Small volume simple ascites, most
prominent in the pelvis. Small amount of simple ill-defined fluid in
the pelvic sidewalls. Small umbilical hernia containing fat and
trace ascitic fluid.

Musculoskeletal: No aggressive appearing focal osseous lesions. Left
hip arthroplasty. Mild lumbar spondylosis.
IMPRESSION: 1. No evidence of metastatic disease in the chest, abdomen or
pelvis.
2. Status post hysterectomy. Small volume simple ascites. Small
amount of ill-defined fluid in the pelvic sidewalls, compatible with
recent surgery.
3. Small umbilical hernia containing fat and trace ascitic fluid.
4. Chronic findings include: Aortic Atherosclerosis ([id number].A).
Small hiatal hernia. Mild sigmoid diverticulosis.

## 2020-11-30 ENCOUNTER — Inpatient Hospital Stay: Payer: Medicare Other | Attending: Hematology and Oncology

## 2020-11-30 ENCOUNTER — Other Ambulatory Visit: Payer: Self-pay

## 2020-11-30 DIAGNOSIS — C541 Malignant neoplasm of endometrium: Secondary | ICD-10-CM | POA: Diagnosis not present

## 2020-11-30 DIAGNOSIS — Z452 Encounter for adjustment and management of vascular access device: Secondary | ICD-10-CM | POA: Diagnosis not present

## 2020-11-30 MED ORDER — HEPARIN SOD (PORK) LOCK FLUSH 100 UNIT/ML IV SOLN
500.0000 [IU] | Freq: Once | INTRAVENOUS | Status: AC
  Administered 2020-11-30: 500 [IU]

## 2020-11-30 MED ORDER — SODIUM CHLORIDE 0.9% FLUSH
10.0000 mL | Freq: Once | INTRAVENOUS | Status: AC
  Administered 2020-11-30: 10 mL

## 2020-12-29 ENCOUNTER — Inpatient Hospital Stay: Payer: Medicare Other

## 2020-12-29 ENCOUNTER — Encounter: Payer: Self-pay | Admitting: Hematology and Oncology

## 2020-12-29 ENCOUNTER — Other Ambulatory Visit: Payer: Medicare Other

## 2020-12-29 ENCOUNTER — Other Ambulatory Visit: Payer: Self-pay

## 2020-12-29 ENCOUNTER — Telehealth: Payer: Self-pay

## 2020-12-29 ENCOUNTER — Inpatient Hospital Stay: Payer: Medicare Other | Attending: Hematology and Oncology | Admitting: Hematology and Oncology

## 2020-12-29 VITALS — BP 150/67 | HR 73 | Temp 97.4°F | Resp 18 | Wt 232.2 lb

## 2020-12-29 DIAGNOSIS — Z923 Personal history of irradiation: Secondary | ICD-10-CM | POA: Diagnosis not present

## 2020-12-29 DIAGNOSIS — I10 Essential (primary) hypertension: Secondary | ICD-10-CM

## 2020-12-29 DIAGNOSIS — C775 Secondary and unspecified malignant neoplasm of intrapelvic lymph nodes: Secondary | ICD-10-CM | POA: Insufficient documentation

## 2020-12-29 DIAGNOSIS — M170 Bilateral primary osteoarthritis of knee: Secondary | ICD-10-CM

## 2020-12-29 DIAGNOSIS — C541 Malignant neoplasm of endometrium: Secondary | ICD-10-CM | POA: Diagnosis not present

## 2020-12-29 DIAGNOSIS — N183 Chronic kidney disease, stage 3 unspecified: Secondary | ICD-10-CM

## 2020-12-29 LAB — CBC WITH DIFFERENTIAL/PLATELET
Abs Immature Granulocytes: 0.04 10*3/uL (ref 0.00–0.07)
Basophils Absolute: 0.1 10*3/uL (ref 0.0–0.1)
Basophils Relative: 1 %
Eosinophils Absolute: 0.3 10*3/uL (ref 0.0–0.5)
Eosinophils Relative: 4 %
HCT: 33.2 % — ABNORMAL LOW (ref 36.0–46.0)
Hemoglobin: 11.3 g/dL — ABNORMAL LOW (ref 12.0–15.0)
Immature Granulocytes: 1 %
Lymphocytes Relative: 19 %
Lymphs Abs: 1.1 10*3/uL (ref 0.7–4.0)
MCH: 30 pg (ref 26.0–34.0)
MCHC: 34 g/dL (ref 30.0–36.0)
MCV: 88.1 fL (ref 80.0–100.0)
Monocytes Absolute: 0.6 10*3/uL (ref 0.1–1.0)
Monocytes Relative: 10 %
Neutro Abs: 3.8 10*3/uL (ref 1.7–7.7)
Neutrophils Relative %: 65 %
Platelets: 211 10*3/uL (ref 150–400)
RBC: 3.77 MIL/uL — ABNORMAL LOW (ref 3.87–5.11)
RDW: 12.9 % (ref 11.5–15.5)
WBC: 5.8 10*3/uL (ref 4.0–10.5)
nRBC: 0 % (ref 0.0–0.2)

## 2020-12-29 LAB — COMPREHENSIVE METABOLIC PANEL
ALT: 11 U/L (ref 0–44)
AST: 12 U/L — ABNORMAL LOW (ref 15–41)
Albumin: 3.9 g/dL (ref 3.5–5.0)
Alkaline Phosphatase: 38 U/L (ref 38–126)
Anion gap: 9 (ref 5–15)
BUN: 15 mg/dL (ref 8–23)
CO2: 26 mmol/L (ref 22–32)
Calcium: 9.5 mg/dL (ref 8.9–10.3)
Chloride: 99 mmol/L (ref 98–111)
Creatinine, Ser: 1.87 mg/dL — ABNORMAL HIGH (ref 0.44–1.00)
GFR, Estimated: 28 mL/min — ABNORMAL LOW (ref >60)
Glucose, Bld: 143 mg/dL — ABNORMAL HIGH (ref 70–99)
Potassium: 3.3 mmol/L — ABNORMAL LOW (ref 3.5–5.1)
Sodium: 134 mmol/L — ABNORMAL LOW (ref 135–145)
Total Bilirubin: 0.4 mg/dL (ref 0.3–1.2)
Total Protein: 6.6 g/dL (ref 6.5–8.1)

## 2020-12-29 MED ORDER — HEPARIN SOD (PORK) LOCK FLUSH 100 UNIT/ML IV SOLN
500.0000 [IU] | Freq: Once | INTRAVENOUS | Status: AC
  Administered 2020-12-29: 500 [IU]

## 2020-12-29 MED ORDER — SODIUM CHLORIDE 0.9% FLUSH
10.0000 mL | Freq: Once | INTRAVENOUS | Status: AC
  Administered 2020-12-29: 10 mL

## 2020-12-29 NOTE — Assessment & Plan Note (Signed)
Your blood pressure is elevated but could be due to anxiety I recommend close monitoring and follow-up with primary care doctor

## 2020-12-29 NOTE — Assessment & Plan Note (Signed)
She has difficulties walking and have chronic knee pain The patient have class III obesity I recommend appointment to see orthopedic surgeon for further management

## 2020-12-29 NOTE — Telephone Encounter (Signed)
Called and given creatinine of 1.87, per Dr. Alvy Bimler. Instructed to drink more water and follow up with PCP. She verbalized understanding and will follow up with PCP.

## 2020-12-29 NOTE — Assessment & Plan Note (Signed)
She has slight worsening kidney function Recommend increase fluid hydration and aggressive risk factor modification with close follow-up with primary care doctor  

## 2020-12-29 NOTE — Assessment & Plan Note (Signed)
Her exam is limited due to body habitus but overall, she have no signs or symptoms to suggest cancer recurrence We discussed port maintenance I recommend she keeps her port for 2 years after treatment I will see her back in a year for further follow-up

## 2020-12-29 NOTE — Progress Notes (Signed)
[facility] OFFICE PROGRESS NOTE  Patient Care Team: [facility], [place] as PCP - General Awanda Mink, Craige Cotta, RN as Oncology Nurse Navigator (Oncology) Gery Pray, MD as Consulting Physician (Radiation Oncology)  ASSESSMENT & PLAN:  Endometrial cancer [facility]) Her exam is limited due to body habitus but overall, she have no signs or symptoms to suggest cancer recurrence We discussed port maintenance I recommend she keeps her port for 2 years after treatment I will see her back in a year for further follow-up  CKD (chronic kidney disease), stage III She has slight worsening kidney function Recommend increase fluid hydration and aggressive risk factor modification with close follow-up with primary care doctor  Hypertension Your blood pressure is elevated but could be due to anxiety I recommend close monitoring and follow-up with primary care doctor  Osteoarthritis of knee She has difficulties walking and have chronic knee pain The patient have class III obesity I recommend appointment to see orthopedic surgeon for further management  No orders of the defined types were placed in this encounter.   All questions were answered. The patient knows to call the clinic with any problems, questions or concerns. The total time spent in the appointment was 20 minutes encounter with patients including review of chart and various tests results, discussions about plan of care and coordination of care plan   Heath Lark, MD 12/29/2020 2:27 PM  INTERVAL HISTORY: Please see below for problem oriented charting. she returns for follow-up for history of uterine cancer status post adjuvant treatment Since last time I saw her, she have lost some weight She complained of significant bilateral knee pain Denies abdominal pain, changes in bowel habits or bloating She has history of intermittent constipation, resolved with laxatives  REVIEW OF SYSTEMS:   Constitutional: Denies fevers,  chills or abnormal weight loss Eyes: Denies blurriness of vision Ears, nose, mouth, throat, and face: Denies mucositis or sore throat Respiratory: Denies cough, dyspnea or wheezes Cardiovascular: Denies palpitation, chest discomfort or lower extremity swelling Gastrointestinal:  Denies nausea, heartburn or change in bowel habits Skin: Denies abnormal skin rashes Lymphatics: Denies new lymphadenopathy or easy bruising Neurological:Denies numbness, tingling or new weaknesses Behavioral/Psych: Mood is stable, no new changes  All other systems were reviewed with the patient and are negative.  I have reviewed the past medical history, past surgical history, social history and family history with the patient and they are unchanged from previous note.  ALLERGIES:  is allergic to amoxicillin and latex.  MEDICATIONS:  Current Outpatient Medications  Medication Sig Dispense Refill   AMITIZA 24 MCG capsule Take 48 mcg by mouth every evening.  (Patient not taking: Reported on 10/13/2020)     B Complex Vitamins (B COMPLEX PO) Take 1 Dose by mouth daily. Liquid b complex     diphenhydrAMINE (BENADRYL) 25 MG tablet Take 50 mg by mouth daily as needed for allergies.     gabapentin (NEURONTIN) 600 MG tablet Take 600-1,200 mg by mouth See admin instructions. Take 1200 mg in the morning, 600 mg in the afternoon, and 600 mg at night     Melatonin 5 MG CAPS Take 5 mg by mouth at bedtime.     meloxicam (MOBIC) 15 MG tablet Take 15 mg by mouth daily in the afternoon.      Oxymetazoline HCl (VICKS SINEX 12 HOUR NA) Place 1 spray into the nose 2 (two) times daily as needed (congestion).     topiramate (TOPAMAX) 25 MG tablet Take 75 mg by  mouth at bedtime.     No current facility-administered medications for this visit.    SUMMARY OF ONCOLOGIC HISTORY: Oncology History Overview Note  Endometrioid, MSI High, MLH1 promoter hypermethylation present   Endometrial cancer ([place])  05/18/2019 Initial Biopsy   EMB:  Complex atypical hyperplasia, cannot rule out microscopic focus of FIGO grade 1 carcinoma   05/18/2019 Initial Diagnosis   Presented in February 2021 with postmenopausal bleeding.  She was started on Provera and underwent endometrial biopsy showing complex atypical hyperplasia with a possible focus suspicious for grade 1 endometrial cancer.   05/21/2019 Imaging   Pelvic ultrasound at [facility]: Anteverted uterus measuring 8.2 cm in greatest dimension.  Endometrial lining measures 1.73 cm and is thickened.  Left ovary not visualized, right ovary normal in appearance.   06/30/2019 Surgery   Robotic-assisted laparoscopic total hysterectomy with bilateral salpingoophorectomy, bilateral pelvic LND  Frozen section showed endometrial cancer with more than 50% myometrial invasion.  Patient did not map to a sentinel lymph node bilaterally.   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  Tumor measures 3.5 cm and invades focally into the serosa.  No involvement of the cervix or bilateral adnexa.  1 of 20 lymph nodes positive for metastatic carcinoma, focus measures 1 cm without evidence of extracapsular extension.  FINAL MICROSCOPIC DIAGNOSIS: A. UTERUS, CERVIX, BILATERAL FALLOPIAN TUBES AND OVARIES, HYSTERECTOMY WITH SALPINGECTOMY: - Endometrioid adenocarcinoma, 3.5 cm, FIGO grade 1 - Carcinoma invades through the entire myometrium and focally into the serosal surface - Benign unremarkable cervix - Benign unremarkable bilateral fallopian tubes and left ovary - Benign serous inclusion cyst of right ovary - See oncology table B. LYMPH NODE, RIGHT PELVIC, BIOPSY: - Metastatic carcinoma to one of twelve lymph nodes (1/12) - Focus of metastatic carcinoma measures 1.0 cm, without evidence of extracapsular extension C. LYMPH NODE, LEFT PELVIC, BIOPSY: - Eight benign lymph nodes (0/8) ONCOLOGY TABLE: UTERUS, CARCINOMA OR CARCINOSARCOMA Procedure: Total  hysterectomy and bilateral salpingo-oophorectomy Histologic type: Endometrioid adenocarcinoma Histologic Grade: FIGO grade 1 Myometrial invasion: Depth of invasion: 30 mm Myometrial thickness: 30 mm Uterine Serosa Involvement: Present, focal Cervical stromal involvement: Not identified Extent of involvement of other organs: Not identified Lymphovascular invasion: Not identified Regional Lymph Nodes: Examined: 0 Sentinel 20 Non-sentinel 20 Total Lymph nodes with metastasis: 1 Isolated tumor cells (<0.2 mm): 0 Micrometastasis: (>0.2 mm and < 2.0 mm): 0 Macrometastasis: (>2.0 mm): 1 Extracapsular extension: Not identified Representative Tumor Block: A6 MMR / MSI testing: Will be ordered Pathologic Stage Classification (pTNM, AJCC 8th edition): pT3, pN1a Comments: None Mismatch Repair Protein (IHC) SUMMARY INTERPRETATION: ABNORMAL 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 The loss of expression may be secondary to promoter hyper-methylation, gene mutation or other genetic event. BRAF mutation testing and/or MLH1 methylation testing is indicated. The presence of a BRAF mutation and/or MLH1 hypermethylation is indicative of a sporadic-type tumor. The absence of either BRAF mutation and/or presence of normal methylation indicate the possible presence of a hereditary germline mutation (e.g.Lynch syndrome) and referral to genetic counseling is warranted. It is recommended that the loss of protein expression be correlated with molecular based MSI testing   06/30/2019 Cancer Staging   Staging form: Corpus Uteri - Carcinoma and Carcinosarcoma, AJCC 8th Edition - Clinical stage from 06/30/2019: FIGO Stage IIIC1 (cT3, cN1a, cM0) - Signed by Lafonda Mosses, MD on 07/09/2019   07/16/2019 Imaging   1. No evidence of metastatic disease in  the chest, abdomen or pelvis. 2. Status post hysterectomy. Small volume simple ascites. Small amount of ill-defined fluid in the pelvic  sidewalls, compatible with recent surgery. 3. Small umbilical hernia containing fat and trace ascitic fluid. 4. Chronic findings include: Aortic Atherosclerosis (ICD10-I70.0). Small hiatal hernia. Mild sigmoid diverticulosis   07/29/2019 Procedure   Ultrasound and fluoroscopically guided right internal jugular single lumen power port catheter insertion. Tip in the SVC/RA junction. Catheter ready for use.   08/03/2019 - 12/02/2019 Chemotherapy   The patient had carboplatin and taxol for chemotherapy treatment.     12/29/2019 Imaging   1. Enlargement of a left external iliac node, borderline sized. This could be reactive or represent early/isolated nodal metastasis. 2. Otherwise, no evidence of metastatic disease in the abdomen or pelvis. 3. Relative hyperattenuation surrounding the gallbladder is favored to be related to hepatic steatosis and sparing. Recommend attention on follow-up. 4. Degraded evaluation of the pelvis, secondary to beam hardening artifact from left hip arthroplasty. 5. Small hiatal hernia. 6. Decrease in trace pelvic fluid. 7. Aortic Atherosclerosis (ICD10-I70.0).   03/30/2020 Imaging   CT A/P: 1. No definite recurrent or metastatic disease in a patient with history of uterine/cervical cancer. Please note limited evaluation of the pelvis due to streak artifact originating from left femoral surgical hardware. 2. Interval development of a nonspecific prominent 0.8 cm retroperitoneal lymph node. Attention on follow-up. 3. Stable prominent bilateral [place] lymph nodes measuring up to 0.9 cm on the left. Attention on follow-up. 4. Small hiatal hernia, likely of the paraesophageal type. 5. Scattered colonic diverticulosis with no acute diverticulitis. 6.  Aortic Atherosclerosis (ICD10-I70.0).   06/13/2020 Imaging   CT A/P: No evidence of recurrent or metastatic disease.   Stable left retroperitoneal and left external iliac lymph node. No new or enlarging lymph nodes.    Hepatic steatosis.   Aortic atherosclerosis.     PHYSICAL EXAMINATION: ECOG PERFORMANCE STATUS: 2 - Symptomatic, <50% confined to bed  Vitals:   12/29/20 1242  BP: (!) 150/67  Pulse: 73  Resp: 18  Temp: (!) 97.4 F (36.3 C)  SpO2: 94%   Filed Weights   12/29/20 1242  Weight: 232 lb 3.2 oz (105.3 kg)    GENERAL:alert, no distress and comfortable SKIN: skin color, texture, turgor are normal, no rashes or significant lesions EYES: normal, Conjunctiva are pink and non-injected, sclera clear OROPHARYNX:no exudate, no erythema and lips, buccal mucosa, and tongue normal  NECK: supple, thyroid normal size, non-tender, without nodularity LYMPH:  no palpable lymphadenopathy in the cervical, axillary or inguinal LUNGS: clear to auscultation and percussion with normal breathing effort HEART: regular rate & rhythm and no murmurs and no lower extremity edema ABDOMEN:abdomen soft, non-tender and normal bowel sounds.  Limited examination due to central obesity Musculoskeletal:no cyanosis of digits and no clubbing  NEURO: alert & oriented x 3 with fluent speech, no focal motor/sensory deficits  LABORATORY DATA:  I have reviewed the data as listed    Component Value Date/Time   NA 134 (L) 12/29/2020 1233   K 3.3 (L) 12/29/2020 1233   CL 99 12/29/2020 1233   CO2 26 12/29/2020 1233   GLUCOSE 143 (H) 12/29/2020 1233   BUN 15 12/29/2020 1233   CREATININE 1.87 (H) 12/29/2020 1233   CREATININE 1.62 (H) 12/30/2019 1300   CALCIUM 9.5 12/29/2020 1233   PROT 6.6 12/29/2020 1233   ALBUMIN 3.9 12/29/2020 1233   AST 12 (L) 12/29/2020 1233   AST 13 (L) 12/30/2019 1300   ALT  11 12/29/2020 1233   ALT 14 12/30/2019 1300   ALKPHOS 38 12/29/2020 1233   BILITOT 0.4 12/29/2020 1233   BILITOT 0.4 12/30/2019 1300   GFRNONAA 28 (L) 12/29/2020 1233   GFRNONAA 31 (L) 12/30/2019 1300   GFRAA 36 (L) 12/30/2019 1300    No results found for: SPEP, UPEP  Lab Results  Component Value Date   WBC 5.8  12/29/2020   NEUTROABS 3.8 12/29/2020   HGB 11.3 (L) 12/29/2020   HCT 33.2 (L) 12/29/2020   MCV 88.1 12/29/2020   PLT 211 12/29/2020      Chemistry      Component Value Date/Time   NA 134 (L) 12/29/2020 1233   K 3.3 (L) 12/29/2020 1233   CL 99 12/29/2020 1233   CO2 26 12/29/2020 1233   BUN 15 12/29/2020 1233   CREATININE 1.87 (H) 12/29/2020 1233   CREATININE 1.62 (H) 12/30/2019 1300      Component Value Date/Time   CALCIUM 9.5 12/29/2020 1233   ALKPHOS 38 12/29/2020 1233   AST 12 (L) 12/29/2020 1233   AST 13 (L) 12/30/2019 1300   ALT 11 12/29/2020 1233   ALT 14 12/30/2019 1300   BILITOT 0.4 12/29/2020 1233   BILITOT 0.4 12/30/2019 1300

## 2020-12-30 ENCOUNTER — Telehealth: Payer: Self-pay | Admitting: Hematology and Oncology

## 2020-12-30 NOTE — Telephone Encounter (Signed)
Pt wants to schedule each port flush at a time, will sch when she comes in for first port flush

## 2021-01-02 ENCOUNTER — Ambulatory Visit: Payer: Medicare Other | Admitting: Gynecologic Oncology

## 2021-01-04 ENCOUNTER — Ambulatory Visit: Payer: Medicare Other | Admitting: Gynecologic Oncology

## 2021-01-13 ENCOUNTER — Inpatient Hospital Stay (HOSPITAL_BASED_OUTPATIENT_CLINIC_OR_DEPARTMENT_OTHER): Payer: Medicare Other | Admitting: Gynecologic Oncology

## 2021-01-13 ENCOUNTER — Telehealth: Payer: Self-pay | Admitting: Gynecologic Oncology

## 2021-01-13 ENCOUNTER — Other Ambulatory Visit: Payer: Self-pay

## 2021-01-13 ENCOUNTER — Encounter: Payer: Self-pay | Admitting: Gynecologic Oncology

## 2021-01-13 VITALS — BP 169/73 | HR 73 | Temp 97.9°F | Resp 16 | Ht 63.0 in | Wt 233.0 lb

## 2021-01-13 DIAGNOSIS — Z923 Personal history of irradiation: Secondary | ICD-10-CM | POA: Diagnosis not present

## 2021-01-13 DIAGNOSIS — C541 Malignant neoplasm of endometrium: Secondary | ICD-10-CM

## 2021-01-13 DIAGNOSIS — C775 Secondary and unspecified malignant neoplasm of intrapelvic lymph nodes: Secondary | ICD-10-CM | POA: Diagnosis not present

## 2021-01-13 NOTE — Patient Instructions (Signed)
It was good to see you today!  I do not see or feel any evidence of cancer on your exam.  Please keep trying to remember to use your vaginal dilator.  This is important to make sure that we are able to do exams when you come for your visits.  We have placed a referral for internal medicine primary care at [facility].  Please call my clinic if you do not hear from them in the next several weeks.  I will plan to see you back in 6 months.  My schedule is not out past the end of the year.  Please call back sometime after the new year to get a visit scheduled with me in late March or early April.  As always, please call if you develop any concerning symptoms such as vaginal bleeding, pelvic pain, change in bowel function, or unintentional weight loss.

## 2021-01-13 NOTE — Progress Notes (Signed)
Gynecologic Oncology Return Clinic Visit  01/13/2021  Reason for Visit: Surveillance visit in the setting of advanced stage uterine cancer  Treatment History: Oncology History Overview Note  Endometrioid, MSI High, MLH1 promoter hypermethylation present   Endometrial cancer ([place])  05/18/2019 Initial Biopsy   EMB: Complex atypical hyperplasia, cannot rule out microscopic focus of FIGO grade 1 carcinoma   05/18/2019 Initial Diagnosis   Presented in February 2021 with postmenopausal bleeding.  She was started on Provera and underwent endometrial biopsy showing complex atypical hyperplasia with a possible focus suspicious for grade 1 endometrial cancer.   05/21/2019 Imaging   Pelvic ultrasound at [facility]: Anteverted uterus measuring 8.2 cm in greatest dimension.  Endometrial lining measures 1.73 cm and is thickened.  Left ovary not visualized, right ovary normal in appearance.   06/30/2019 Surgery   Robotic-assisted laparoscopic total hysterectomy with bilateral salpingoophorectomy, bilateral pelvic LND  Frozen section showed endometrial cancer with more than 50% myometrial invasion.  Patient did not map to a sentinel lymph node bilaterally.   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  Tumor measures 3.5 cm and invades focally into the serosa.  No involvement of the cervix or bilateral adnexa.  1 of 20 lymph nodes positive for metastatic carcinoma, focus measures 1 cm without evidence of extracapsular extension.  FINAL MICROSCOPIC DIAGNOSIS: A. UTERUS, CERVIX, BILATERAL FALLOPIAN TUBES AND OVARIES, HYSTERECTOMY WITH SALPINGECTOMY: - Endometrioid adenocarcinoma, 3.5 cm, FIGO grade 1 - Carcinoma invades through the entire myometrium and focally into the serosal surface - Benign unremarkable cervix - Benign unremarkable bilateral fallopian tubes and left ovary - Benign serous inclusion cyst of right ovary - See oncology table B. LYMPH NODE,  RIGHT PELVIC, BIOPSY: - Metastatic carcinoma to one of twelve lymph nodes (1/12) - Focus of metastatic carcinoma measures 1.0 cm, without evidence of extracapsular extension C. LYMPH NODE, LEFT PELVIC, BIOPSY: - Eight benign lymph nodes (0/8) ONCOLOGY TABLE: UTERUS, CARCINOMA OR CARCINOSARCOMA Procedure: Total hysterectomy and bilateral salpingo-oophorectomy Histologic type: Endometrioid adenocarcinoma Histologic Grade: FIGO grade 1 Myometrial invasion: Depth of invasion: 30 mm Myometrial thickness: 30 mm Uterine Serosa Involvement: Present, focal Cervical stromal involvement: Not identified Extent of involvement of other organs: Not identified Lymphovascular invasion: Not identified Regional Lymph Nodes: Examined: 0 Sentinel 20 Non-sentinel 20 Total Lymph nodes with metastasis: 1 Isolated tumor cells (<0.2 mm): 0 Micrometastasis: (>0.2 mm and < 2.0 mm): 0 Macrometastasis: (>2.0 mm): 1 Extracapsular extension: Not identified Representative Tumor Block: A6 MMR / MSI testing: Will be ordered Pathologic Stage Classification (pTNM, AJCC 8th edition): pT3, pN1a Comments: None Mismatch Repair Protein (IHC) SUMMARY INTERPRETATION: ABNORMAL 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 The loss of expression may be secondary to promoter hyper-methylation, gene mutation or other genetic event. BRAF mutation testing and/or MLH1 methylation testing is indicated. The presence of a BRAF mutation and/or MLH1 hypermethylation is indicative of a sporadic-type tumor. The absence of either BRAF mutation and/or presence of normal methylation indicate the possible presence of a hereditary germline mutation (e.g.Lynch syndrome) and referral to genetic counseling is warranted. It is recommended that the loss of protein expression be correlated with molecular based MSI testing   06/30/2019 Cancer Staging   Staging form: Corpus Uteri - Carcinoma and Carcinosarcoma, AJCC 8th Edition -  Clinical stage from 06/30/2019: FIGO Stage IIIC1 (cT3, cN1a, cM0) - Signed by Lafonda Mosses, MD on 07/09/2019   07/16/2019 Imaging   1. No evidence of  metastatic disease in the chest, abdomen or pelvis. 2. Status post hysterectomy. Small volume simple ascites. Small amount of ill-defined fluid in the pelvic sidewalls, compatible with recent surgery. 3. Small umbilical hernia containing fat and trace ascitic fluid. 4. Chronic findings include: Aortic Atherosclerosis (ICD10-I70.0). Small hiatal hernia. Mild sigmoid diverticulosis   07/29/2019 Procedure   Ultrasound and fluoroscopically guided right internal jugular single lumen power port catheter insertion. Tip in the SVC/RA junction. Catheter ready for use.   08/03/2019 - 12/02/2019 Chemotherapy   The patient had carboplatin and taxol for chemotherapy treatment.     12/29/2019 Imaging   1. Enlargement of a left external iliac node, borderline sized. This could be reactive or represent early/isolated nodal metastasis. 2. Otherwise, no evidence of metastatic disease in the abdomen or pelvis. 3. Relative hyperattenuation surrounding the gallbladder is favored to be related to hepatic steatosis and sparing. Recommend attention on follow-up. 4. Degraded evaluation of the pelvis, secondary to beam hardening artifact from left hip arthroplasty. 5. Small hiatal hernia. 6. Decrease in trace pelvic fluid. 7. Aortic Atherosclerosis (ICD10-I70.0).   03/30/2020 Imaging   CT A/P: 1. No definite recurrent or metastatic disease in a patient with history of uterine/cervical cancer. Please note limited evaluation of the pelvis due to streak artifact originating from left femoral surgical hardware. 2. Interval development of a nonspecific prominent 0.8 cm retroperitoneal lymph node. Attention on follow-up. 3. Stable prominent bilateral [place] lymph nodes measuring up to 0.9 cm on the left. Attention on follow-up. 4. Small hiatal hernia, likely of the  paraesophageal type. 5. Scattered colonic diverticulosis with no acute diverticulitis. 6.  Aortic Atherosclerosis (ICD10-I70.0).   06/13/2020 Imaging   CT A/P: No evidence of recurrent or metastatic disease.   Stable left retroperitoneal and left external iliac lymph node. No new or enlarging lymph nodes.   Hepatic steatosis.   Aortic atherosclerosis.     Interval History: Patient presents today for surveillance visit.  She notes overall doing very well since her last visit.  She denies any vaginal bleeding or discharge.  She is using her vaginal dilator very infrequently.  She reports regular bowel and bladder function.  She denies any abdominal or pelvic pain.  Had some weight loss initially since I last saw her after eating some diet changes, but then she visited some family and gained the weight back.  Past Medical/Surgical History: Past Medical History:  Diagnosis Date   Cataract    Complex endometrial hyperplasia with atypia    Dyspnea    endometrial ca 06/2019   Family history of colon cancer    Family history of lung cancer    Family history of melanoma    Family history of prostate cancer    GERD (gastroesophageal reflux disease)    Heart murmur    as a child   History of radiation therapy    endometrium, Dr. Gery Pray 09/07/19-10/14/19   Hypertension    Obesity    Pneumonia 2018   Post-menopausal bleeding 05/18/2019    Past Surgical History:  Procedure Laterality Date   BIOPSY ENDOMETRIAL     CATARACT EXTRACTION  03/2019   ESOPHAGOGASTRODUODENOSCOPY     IR IMAGING GUIDED PORT INSERTION  07/29/2019   ROBOTIC ASSISTED TOTAL HYSTERECTOMY WITH BILATERAL SALPINGO OOPHERECTOMY Bilateral 06/30/2019   Procedure: XI ROBOTIC ASSISTED TOTAL HYSTERECTOMY WITH BILATERAL SALPINGO OOPHORECTOMY, BILATERAL LYMPH NODE DISSECTION;  Surgeon: Lafonda Mosses, MD;  Location: [facility];  Service: Gynecology;  Laterality: Bilateral;   SENTINEL NODE BIOPSY N/A  06/30/2019    Procedure: SENTINEL LYMPH NODE BIOPSY;  Surgeon: Lafonda Mosses, MD;  Location: [facility];  Service: Gynecology;  Laterality: N/A;   TONSILLECTOMY     TOTAL HIP ARTHROPLASTY Left    WISDOM TOOTH EXTRACTION      Family History  Problem Relation Age of Onset   Colon cancer Mother 22       treated with surgery only   Lung cancer Mother        dx in her mid-2s   CVA Father    Melanoma Sister        dx <60   Cancer Maternal Aunt        unknown type, dx >50   Melanoma Nephew        dx in his 6s   Endometrial cancer Neg Hx    Ovarian cancer Neg Hx    Breast cancer Neg Hx     Social History   Socioeconomic History   Marital status: Divorced    Spouse name: Not on file   Number of children: Not on file   Years of education: Not on file   Highest education level: Not on file  Occupational History   Not on file  Tobacco Use   Smoking status: Never   Smokeless tobacco: Never  Vaping Use   Vaping Use: Never used  Substance and Sexual Activity   Alcohol use: No   Drug use: Never   Sexual activity: Not Currently  Other Topics Concern   Not on file  Social History Narrative   Not on file   Social Determinants of Health   Financial Resource Strain: Not on file  Food Insecurity: Not on file  Transportation Needs: Not on file  Physical Activity: Not on file  Stress: Not on file  Social Connections: Not on file    Current Medications:  Current Outpatient Medications:    AMITIZA 24 MCG capsule, Take 48 mcg by mouth every evening.  (Patient not taking: Reported on 10/13/2020), Disp: , Rfl:    B Complex Vitamins (B COMPLEX PO), Take 1 Dose by mouth daily. Liquid b complex, Disp: , Rfl:    diphenhydrAMINE (BENADRYL) 25 MG tablet, Take 50 mg by mouth daily as needed for allergies., Disp: , Rfl:    gabapentin (NEURONTIN) 600 MG tablet, Take 600-1,200 mg by mouth See admin instructions. Take 1200 mg in the morning, 600 mg in the afternoon, and 600 mg at night, Disp: , Rfl:     Melatonin 5 MG CAPS, Take 5 mg by mouth at bedtime., Disp: , Rfl:    meloxicam (MOBIC) 15 MG tablet, Take 15 mg by mouth daily in the afternoon. , Disp: , Rfl:    Oxymetazoline HCl (VICKS SINEX 12 HOUR NA), Place 1 spray into the nose 2 (two) times daily as needed (congestion)., Disp: , Rfl:    topiramate (TOPAMAX) 25 MG tablet, Take 75 mg by mouth at bedtime., Disp: , Rfl:   Review of Systems: Denies appetite changes, fevers, chills, fatigue, unexplained weight changes. Denies hearing loss, neck lumps or masses, mouth sores, ringing in ears or voice changes. Denies cough or wheezing.  Denies shortness of breath. Denies chest pain or palpitations. Denies leg swelling. Denies abdominal distention, pain, blood in stools, constipation, diarrhea, nausea, vomiting, or early satiety. Denies pain with intercourse, dysuria, frequency, hematuria or incontinence. Denies hot flashes, pelvic pain, vaginal bleeding or vaginal discharge.   Denies joint pain, back pain or muscle pain/cramps. Denies itching, rash,  or wounds. Denies dizziness, headaches, numbness or seizures. Denies swollen lymph nodes or glands, denies easy bruising or bleeding. Denies anxiety, depression, confusion, or decreased concentration.  Physical Exam: BP (!) 169/73 (BP Location: Right Arm, Patient Position: Sitting)   Pulse 73   Temp 97.9 F (36.6 C) (Oral)   Resp 16   Ht 5' 3"  (1.6 m)   Wt 233 lb (105.7 kg)   SpO2 96%   BMI 41.27 kg/m  General: Alert, oriented, no acute distress. HEENT: Normocephalic, atraumatic, sclera anicteric. Chest: Clear to auscultation bilaterally.  No wheezing or rhonchi. Cardiovascular: Regular rate and rhythm, no murmurs. Abdomen: Obese, soft, nontender.  Normoactive bowel sounds.  No masses or hepatosplenomegaly appreciated.  Well-healed upper scopic incisions. Extremities: Grossly normal range of motion.  Warm, well perfused.  No edema bilaterally. Skin: Mild erythema underneath the pannus  consistent with Candida. Lymphatics: No cervical, supraclavicular, or inguinal adenopathy. GU: Normal appearing external genitalia without erythema, excoriation, or lesions.  Speculum exam reveals moderately atrophic vaginal mucosa, radiation changes noted.  No bleeding, discharge, masses appreciated.  Bimanual exam reveals cuff intact, no masses or nodularity.  Rectovaginal exam confirms these findings.  Laboratory & Radiologic Studies: None new  Assessment & Plan: Heidi Avila is a 75 y.o. woman with Stage IIIC1 grade 1 endometrioid endometrial adenocarcinoma who completed adjuvant therapy with carboplatin and paclitaxel with vaginal brachytherapy approximately August 2021.  Patient is doing very well and is NED on exam today.  I stressed the importance of continued vaginal dilator use.  Patient's primary care provider retired and she has not established with someone new.  We discussed the importance of this given her age and medical comorbidities.  She would be interested in seeing someone at [facility] and my office place a referral for this.  I have asked her to reach out if she does not hear from someone regarding scheduling in the next several weeks.  Per NCCN surveillance recommendations, we will continue with visits every 3 months.  She is scheduled to see Dr. Sondra Come in December and will return to see me in March 2023.  32 minutes of total time was spent for this patient encounter, including preparation, face-to-face counseling with the patient and coordination of care, and documentation of the encounter.  Jeral Pinch, MD  Division of Gynecologic Oncology  Department of Obstetrics and Gynecology  [facility]

## 2021-02-07 DIAGNOSIS — Z23 Encounter for immunization: Secondary | ICD-10-CM | POA: Diagnosis not present

## 2021-02-19 DIAGNOSIS — U071 COVID-19: Secondary | ICD-10-CM | POA: Diagnosis not present

## 2021-02-23 ENCOUNTER — Other Ambulatory Visit: Payer: Self-pay

## 2021-02-23 ENCOUNTER — Inpatient Hospital Stay: Payer: Medicare Other | Attending: Hematology and Oncology

## 2021-02-23 DIAGNOSIS — N183 Chronic kidney disease, stage 3 unspecified: Secondary | ICD-10-CM | POA: Insufficient documentation

## 2021-02-23 DIAGNOSIS — Z452 Encounter for adjustment and management of vascular access device: Secondary | ICD-10-CM | POA: Insufficient documentation

## 2021-02-23 DIAGNOSIS — M179 Osteoarthritis of knee, unspecified: Secondary | ICD-10-CM | POA: Diagnosis not present

## 2021-02-23 DIAGNOSIS — Z79899 Other long term (current) drug therapy: Secondary | ICD-10-CM | POA: Diagnosis not present

## 2021-02-23 DIAGNOSIS — C541 Malignant neoplasm of endometrium: Secondary | ICD-10-CM | POA: Diagnosis not present

## 2021-02-23 DIAGNOSIS — I129 Hypertensive chronic kidney disease with stage 1 through stage 4 chronic kidney disease, or unspecified chronic kidney disease: Secondary | ICD-10-CM | POA: Diagnosis not present

## 2021-02-23 MED ORDER — SODIUM CHLORIDE 0.9% FLUSH
10.0000 mL | Freq: Once | INTRAVENOUS | Status: AC
  Administered 2021-02-23: 10 mL

## 2021-02-23 MED ORDER — HEPARIN SOD (PORK) LOCK FLUSH 100 UNIT/ML IV SOLN
500.0000 [IU] | Freq: Once | INTRAVENOUS | Status: AC
  Administered 2021-02-23: 500 [IU]

## 2021-02-28 DIAGNOSIS — Z1231 Encounter for screening mammogram for malignant neoplasm of breast: Secondary | ICD-10-CM | POA: Diagnosis not present

## 2021-02-28 DIAGNOSIS — E559 Vitamin D deficiency, unspecified: Secondary | ICD-10-CM | POA: Diagnosis not present

## 2021-02-28 DIAGNOSIS — E7800 Pure hypercholesterolemia, unspecified: Secondary | ICD-10-CM | POA: Diagnosis not present

## 2021-02-28 DIAGNOSIS — Z1211 Encounter for screening for malignant neoplasm of colon: Secondary | ICD-10-CM | POA: Diagnosis not present

## 2021-02-28 DIAGNOSIS — Z79899 Other long term (current) drug therapy: Secondary | ICD-10-CM | POA: Diagnosis not present

## 2021-02-28 DIAGNOSIS — R5383 Other fatigue: Secondary | ICD-10-CM | POA: Diagnosis not present

## 2021-02-28 DIAGNOSIS — Z0000 Encounter for general adult medical examination without abnormal findings: Secondary | ICD-10-CM | POA: Diagnosis not present

## 2021-02-28 DIAGNOSIS — M129 Arthropathy, unspecified: Secondary | ICD-10-CM | POA: Diagnosis not present

## 2021-02-28 DIAGNOSIS — R030 Elevated blood-pressure reading, without diagnosis of hypertension: Secondary | ICD-10-CM | POA: Diagnosis not present

## 2021-02-28 DIAGNOSIS — I10 Essential (primary) hypertension: Secondary | ICD-10-CM | POA: Diagnosis not present

## 2021-02-28 DIAGNOSIS — Z760 Encounter for issue of repeat prescription: Secondary | ICD-10-CM | POA: Diagnosis not present

## 2021-03-03 DIAGNOSIS — M25561 Pain in right knee: Secondary | ICD-10-CM | POA: Diagnosis not present

## 2021-03-03 DIAGNOSIS — M1711 Unilateral primary osteoarthritis, right knee: Secondary | ICD-10-CM | POA: Diagnosis not present

## 2021-03-23 DIAGNOSIS — E559 Vitamin D deficiency, unspecified: Secondary | ICD-10-CM | POA: Diagnosis not present

## 2021-03-23 DIAGNOSIS — E7800 Pure hypercholesterolemia, unspecified: Secondary | ICD-10-CM | POA: Diagnosis not present

## 2021-03-23 DIAGNOSIS — I10 Essential (primary) hypertension: Secondary | ICD-10-CM | POA: Diagnosis not present

## 2021-03-23 DIAGNOSIS — R030 Elevated blood-pressure reading, without diagnosis of hypertension: Secondary | ICD-10-CM | POA: Diagnosis not present

## 2021-04-11 ENCOUNTER — Encounter: Payer: Self-pay | Admitting: Radiology

## 2021-04-13 ENCOUNTER — Ambulatory Visit: Payer: Medicare Other | Admitting: Radiation Oncology

## 2021-04-20 ENCOUNTER — Telehealth: Payer: Self-pay | Admitting: *Deleted

## 2021-04-20 NOTE — Telephone Encounter (Signed)
RETURNED PATIENT'S PHONE CALL, SPOKE WITH PATIENT. ?

## 2021-04-21 ENCOUNTER — Ambulatory Visit: Payer: Medicare Other | Admitting: Family

## 2021-04-24 ENCOUNTER — Ambulatory Visit: Payer: Medicare Other | Admitting: Radiation Oncology

## 2021-04-25 DIAGNOSIS — R030 Elevated blood-pressure reading, without diagnosis of hypertension: Secondary | ICD-10-CM | POA: Diagnosis not present

## 2021-04-25 DIAGNOSIS — M25552 Pain in left hip: Secondary | ICD-10-CM | POA: Diagnosis not present

## 2021-04-25 DIAGNOSIS — M25551 Pain in right hip: Secondary | ICD-10-CM | POA: Diagnosis not present

## 2021-04-25 DIAGNOSIS — Z01818 Encounter for other preprocedural examination: Secondary | ICD-10-CM | POA: Diagnosis not present

## 2021-04-25 DIAGNOSIS — K5909 Other constipation: Secondary | ICD-10-CM | POA: Diagnosis not present

## 2021-04-25 DIAGNOSIS — M179 Osteoarthritis of knee, unspecified: Secondary | ICD-10-CM | POA: Diagnosis not present

## 2021-05-17 DIAGNOSIS — F4322 Adjustment disorder with anxiety: Secondary | ICD-10-CM | POA: Diagnosis not present

## 2021-05-17 DIAGNOSIS — M1711 Unilateral primary osteoarthritis, right knee: Secondary | ICD-10-CM | POA: Diagnosis not present

## 2021-05-17 DIAGNOSIS — Z1331 Encounter for screening for depression: Secondary | ICD-10-CM | POA: Diagnosis not present

## 2021-05-17 DIAGNOSIS — I10 Essential (primary) hypertension: Secondary | ICD-10-CM | POA: Diagnosis not present

## 2021-05-27 NOTE — Progress Notes (Signed)
Radiation Oncology         [phone] ________________________________  Name: Heidi Avila MRN: [id number]  Date: 05/29/2021  DOB: 1945-08-28  Follow-Up Visit Note  CC: Phys[facility], [facility], MD    ICD-10-CM   1. 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 Since her last visit, the patient followed up with Dr. Alvy Bimler on 12/29/20. During which time, the patient was noted to report no signs or symptoms concerning for disease recurrence. Dr. Alvy Bimler also recommended the patient to keep her port in place for a total of 2 years post-treatment, and discussed port maintenance with the patient. In regards to her chronic knee pain, the patient was also advised to meet with orthopedic surgery for further advice and management.      During a follow up visit with Dr. Berline Lopes on 01/13/21, the patient denied any vaginal bleeding or discharge, and reported normal bowel and bladder function. The patient also reported using her vaginal dilator very infrequently. Speculum exam performed during this visit revealed moderately atrophic vaginal mucosa and radiation changes. Otherwise, the patient was NED on examination, and was encouraged to use her vaginal dilator more frequently.        She denies any new medical issues.  She is a negative knee replacement later this spring.  She denies any pelvic pain vaginal bleeding, rectal bleeding or blood in her urine.  She uses her vaginal dilator approximately once a week and have encouraged her to use this more often.   She denies any bleeding after dilator use.  She denies any abdominal bloating.                   Allergies:  is allergic to amoxicillin and latex.  Meds: Current Outpatient Medications  Medication Sig Dispense Refill   B Complex Vitamins (B COMPLEX PO) Take 1 Dose by mouth daily. Liquid b complex     diphenhydrAMINE (BENADRYL) 25 MG tablet Take 50 mg by mouth daily as needed for allergies.     gabapentin (NEURONTIN) 600 MG tablet Take 600-1,200 mg by mouth See admin instructions. Take 1200 mg in the morning, 600 mg in the afternoon, and 600 mg at night     Melatonin 5 MG CAPS Take 5 mg by mouth at bedtime.     meloxicam (MOBIC) 15 MG tablet Take 15 mg by mouth daily in the afternoon.      Oxymetazoline HCl (VICKS SINEX 12 HOUR NA) Place 1 spray into the nose 2 (two) times daily as needed (congestion).     topiramate (TOPAMAX) 25 MG tablet Take 75 mg by mouth at bedtime.     AMITIZA 24 MCG capsule Take 48 mcg by mouth every evening.  (Patient not taking: Reported on 10/13/2020)     No current facility-administered medications for this encounter.    Physical Findings: The patient is in no acute distress. Patient is alert and oriented.  height is 5\' 3"  (1.6 m) and weight is 217 lb 4 oz (98.5 kg). Her temporal temperature is  97.2 F (36.2 C) (abnormal). Her blood pressure is 162/72 (abnormal) and her pulse is 75. Her respiration is 18 and oxygen saturation is 96%. .  No significant changes. Lungs are clear to auscultation bilaterally. Heart has regular rate and rhythm. No palpable cervical, supraclavicular, or axillary adenopathy. Abdomen soft, non-tender, normal bowel sounds.  On pelvic examination the external genitalia were unremarkable. A speculum exam was performed. There are no mucosal lesions noted in the vaginal vault.  On bimanual and rectovaginal examination there are no pelvic masses appreciated.  Some mild radiation changes noted at the vaginal cuff.  Vaginal cuff is intact.  Rectal  sphincter tone normal.  Lab Findings: Lab Results  Component Value Date   WBC 5.8 12/29/2020   HGB 11.3 (L) 12/29/2020   HCT 33.2 (L) 12/29/2020   MCV 88.1 12/29/2020   PLT 211 12/29/2020    Radiographic Findings: No results found.  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  Patient exhibits no lasting effects from her vaginal brachytherapy.  Plan: Routine follow-up in radiation oncology in 6 months.  The patient will see Dr. Berline Lopes in 3 months.   20 minutes of total time was spent for this patient encounter, including preparation, face-to-face counseling with the patient and coordination of care, physical exam, and documentation of the encounter. ____________________________________  [name], PhD, MD   This document serves as a record of services personally performed by Gery Pray, MD. It was created on his behalf by Roney Mans, a trained medical scribe. The creation of this record is based on the scribe's personal observations and the provider's statements to them. This document has been checked and approved by the attending provider.

## 2021-05-29 ENCOUNTER — Ambulatory Visit
Admission: RE | Admit: 2021-05-29 | Discharge: 2021-05-29 | Disposition: A | Discharge status: Home / Self Care | Payer: Medicare Other | Source: Ambulatory Visit | Attending: Radiation Oncology | Admitting: Radiation Oncology

## 2021-05-29 ENCOUNTER — Encounter: Payer: Self-pay | Admitting: Radiation Oncology

## 2021-05-29 ENCOUNTER — Inpatient Hospital Stay: Payer: Medicare Other | Attending: Hematology and Oncology | Admitting: *Deleted

## 2021-05-29 ENCOUNTER — Other Ambulatory Visit: Payer: Self-pay

## 2021-05-29 DIAGNOSIS — G8929 Other chronic pain: Secondary | ICD-10-CM | POA: Insufficient documentation

## 2021-05-29 DIAGNOSIS — Z79899 Other long term (current) drug therapy: Secondary | ICD-10-CM | POA: Insufficient documentation

## 2021-05-29 DIAGNOSIS — Z923 Personal history of irradiation: Secondary | ICD-10-CM | POA: Diagnosis not present

## 2021-05-29 DIAGNOSIS — C541 Malignant neoplasm of endometrium: Secondary | ICD-10-CM

## 2021-05-29 DIAGNOSIS — Z8542 Personal history of malignant neoplasm of other parts of uterus: Secondary | ICD-10-CM | POA: Insufficient documentation

## 2021-05-29 DIAGNOSIS — M25569 Pain in unspecified knee: Secondary | ICD-10-CM | POA: Insufficient documentation

## 2021-05-29 DIAGNOSIS — Z08 Encounter for follow-up examination after completed treatment for malignant neoplasm: Secondary | ICD-10-CM | POA: Diagnosis not present

## 2021-05-29 NOTE — Progress Notes (Signed)
[facility] is here today for follow up post radiation to the pelvic.  They completed their radiation on: 10/14/19   Does the patient complain of any of the following:  Pain: No Abdominal bloating: No Diarrhea/Constipation: No Nausea/Vomiting: no Vaginal Discharge: no Blood in Urine or Stool: no Urinary Issues (dysuria/incomplete emptying/ incontinence/ increased frequency/urgency): Patient reports urinary frequency.  Does patient report using vaginal dilator 2-3 times a week and/or sexually active 2-3 weeks: Yes, reports using once weekly.  Post radiation skin changes: no   Additional comments if applicable:   Vitals:   05/29/21 1134  BP: (!) 162/72  Pulse: 75  Resp: 18  Temp: (!) 97.2 F (36.2 C)  TempSrc: Temporal  SpO2: 96%  Weight: 217 lb 4 oz (98.5 kg)  Height: 5\' 3"  (1.6 m)

## 2021-05-29 NOTE — Progress Notes (Signed)
[place] [facility]  Patient presented to [facility]  to review and complete healthcare advance directives.  Chaplain met with patien..  The patient designated her nieces are healthcare agents. Patient also completed healthcare living will.    Documents were notarized and copies made for patient/family. Clinical Social Worker will send documents to medical records to be scanned into patient's chart. Clinical Social Worker encouraged patient/family to contact with any additional questions or concerns.   [place]

## 2021-06-01 DIAGNOSIS — M79609 Pain in unspecified limb: Secondary | ICD-10-CM | POA: Diagnosis not present

## 2021-06-01 DIAGNOSIS — M1711 Unilateral primary osteoarthritis, right knee: Secondary | ICD-10-CM | POA: Diagnosis not present

## 2021-06-01 DIAGNOSIS — Z22322 Carrier or suspected carrier of Methicillin resistant Staphylococcus aureus: Secondary | ICD-10-CM | POA: Diagnosis not present

## 2021-06-01 DIAGNOSIS — Z01818 Encounter for other preprocedural examination: Secondary | ICD-10-CM | POA: Diagnosis not present

## 2021-06-01 DIAGNOSIS — M25569 Pain in unspecified knee: Secondary | ICD-10-CM | POA: Diagnosis not present

## 2021-06-07 DIAGNOSIS — M109 Gout, unspecified: Secondary | ICD-10-CM | POA: Diagnosis not present

## 2021-06-07 DIAGNOSIS — F5102 Adjustment insomnia: Secondary | ICD-10-CM | POA: Diagnosis not present

## 2021-06-07 DIAGNOSIS — F4322 Adjustment disorder with anxiety: Secondary | ICD-10-CM | POA: Diagnosis not present

## 2021-06-07 DIAGNOSIS — M1711 Unilateral primary osteoarthritis, right knee: Secondary | ICD-10-CM | POA: Diagnosis not present

## 2021-06-19 DIAGNOSIS — G4700 Insomnia, unspecified: Secondary | ICD-10-CM | POA: Diagnosis not present

## 2021-06-19 DIAGNOSIS — Z791 Long term (current) use of non-steroidal anti-inflammatories (NSAID): Secondary | ICD-10-CM | POA: Diagnosis not present

## 2021-06-19 DIAGNOSIS — I10 Essential (primary) hypertension: Secondary | ICD-10-CM | POA: Diagnosis not present

## 2021-06-19 DIAGNOSIS — M1711 Unilateral primary osteoarthritis, right knee: Secondary | ICD-10-CM | POA: Diagnosis not present

## 2021-06-19 DIAGNOSIS — Z7982 Long term (current) use of aspirin: Secondary | ICD-10-CM | POA: Diagnosis not present

## 2021-06-19 DIAGNOSIS — Z880 Allergy status to penicillin: Secondary | ICD-10-CM | POA: Diagnosis not present

## 2021-06-19 DIAGNOSIS — G8929 Other chronic pain: Secondary | ICD-10-CM | POA: Diagnosis not present

## 2021-06-19 DIAGNOSIS — F32A Depression, unspecified: Secondary | ICD-10-CM | POA: Diagnosis not present

## 2021-06-19 DIAGNOSIS — Z6839 Body mass index (BMI) 39.0-39.9, adult: Secondary | ICD-10-CM | POA: Diagnosis not present

## 2021-06-19 DIAGNOSIS — Z79899 Other long term (current) drug therapy: Secondary | ICD-10-CM | POA: Diagnosis not present

## 2021-06-19 DIAGNOSIS — M109 Gout, unspecified: Secondary | ICD-10-CM | POA: Diagnosis not present

## 2021-06-19 DIAGNOSIS — K219 Gastro-esophageal reflux disease without esophagitis: Secondary | ICD-10-CM | POA: Diagnosis not present

## 2021-06-19 DIAGNOSIS — G629 Polyneuropathy, unspecified: Secondary | ICD-10-CM | POA: Diagnosis not present

## 2021-06-19 DIAGNOSIS — Z96642 Presence of left artificial hip joint: Secondary | ICD-10-CM | POA: Diagnosis not present

## 2021-06-19 DIAGNOSIS — K5909 Other constipation: Secondary | ICD-10-CM | POA: Diagnosis not present

## 2021-06-19 DIAGNOSIS — N393 Stress incontinence (female) (male): Secondary | ICD-10-CM | POA: Diagnosis not present

## 2021-06-19 DIAGNOSIS — Z8542 Personal history of malignant neoplasm of other parts of uterus: Secondary | ICD-10-CM | POA: Diagnosis not present

## 2021-06-19 DIAGNOSIS — G8918 Other acute postprocedural pain: Secondary | ICD-10-CM | POA: Diagnosis not present

## 2021-06-19 HISTORY — PX: REPLACEMENT TOTAL KNEE: SUR1224

## 2021-06-20 DIAGNOSIS — K219 Gastro-esophageal reflux disease without esophagitis: Secondary | ICD-10-CM | POA: Diagnosis not present

## 2021-06-20 DIAGNOSIS — M1711 Unilateral primary osteoarthritis, right knee: Secondary | ICD-10-CM | POA: Diagnosis not present

## 2021-06-20 DIAGNOSIS — I10 Essential (primary) hypertension: Secondary | ICD-10-CM | POA: Diagnosis not present

## 2021-06-20 DIAGNOSIS — M109 Gout, unspecified: Secondary | ICD-10-CM | POA: Diagnosis not present

## 2021-06-20 DIAGNOSIS — G8929 Other chronic pain: Secondary | ICD-10-CM | POA: Diagnosis not present

## 2021-06-20 DIAGNOSIS — G629 Polyneuropathy, unspecified: Secondary | ICD-10-CM | POA: Diagnosis not present

## 2021-06-21 DIAGNOSIS — G629 Polyneuropathy, unspecified: Secondary | ICD-10-CM | POA: Diagnosis not present

## 2021-06-21 DIAGNOSIS — Z4801 Encounter for change or removal of surgical wound dressing: Secondary | ICD-10-CM | POA: Diagnosis not present

## 2021-06-21 DIAGNOSIS — M109 Gout, unspecified: Secondary | ICD-10-CM | POA: Diagnosis not present

## 2021-06-21 DIAGNOSIS — G4700 Insomnia, unspecified: Secondary | ICD-10-CM | POA: Diagnosis not present

## 2021-06-21 DIAGNOSIS — Z96651 Presence of right artificial knee joint: Secondary | ICD-10-CM | POA: Diagnosis not present

## 2021-06-21 DIAGNOSIS — Z96641 Presence of right artificial hip joint: Secondary | ICD-10-CM | POA: Diagnosis not present

## 2021-06-21 DIAGNOSIS — G8929 Other chronic pain: Secondary | ICD-10-CM | POA: Diagnosis not present

## 2021-06-21 DIAGNOSIS — K219 Gastro-esophageal reflux disease without esophagitis: Secondary | ICD-10-CM | POA: Diagnosis not present

## 2021-06-21 DIAGNOSIS — Z471 Aftercare following joint replacement surgery: Secondary | ICD-10-CM | POA: Diagnosis not present

## 2021-06-21 DIAGNOSIS — I10 Essential (primary) hypertension: Secondary | ICD-10-CM | POA: Diagnosis not present

## 2021-06-21 DIAGNOSIS — F32A Depression, unspecified: Secondary | ICD-10-CM | POA: Diagnosis not present

## 2021-06-21 DIAGNOSIS — M545 Low back pain, unspecified: Secondary | ICD-10-CM | POA: Diagnosis not present

## 2021-06-21 DIAGNOSIS — Z7982 Long term (current) use of aspirin: Secondary | ICD-10-CM | POA: Diagnosis not present

## 2021-06-23 DIAGNOSIS — M545 Low back pain, unspecified: Secondary | ICD-10-CM | POA: Diagnosis not present

## 2021-06-23 DIAGNOSIS — G8929 Other chronic pain: Secondary | ICD-10-CM | POA: Diagnosis not present

## 2021-06-23 DIAGNOSIS — I10 Essential (primary) hypertension: Secondary | ICD-10-CM | POA: Diagnosis not present

## 2021-06-23 DIAGNOSIS — G629 Polyneuropathy, unspecified: Secondary | ICD-10-CM | POA: Diagnosis not present

## 2021-06-23 DIAGNOSIS — Z471 Aftercare following joint replacement surgery: Secondary | ICD-10-CM | POA: Diagnosis not present

## 2021-06-23 DIAGNOSIS — Z4801 Encounter for change or removal of surgical wound dressing: Secondary | ICD-10-CM | POA: Diagnosis not present

## 2021-06-26 DIAGNOSIS — Z471 Aftercare following joint replacement surgery: Secondary | ICD-10-CM | POA: Diagnosis not present

## 2021-06-26 DIAGNOSIS — G8929 Other chronic pain: Secondary | ICD-10-CM | POA: Diagnosis not present

## 2021-06-26 DIAGNOSIS — G629 Polyneuropathy, unspecified: Secondary | ICD-10-CM | POA: Diagnosis not present

## 2021-06-26 DIAGNOSIS — M545 Low back pain, unspecified: Secondary | ICD-10-CM | POA: Diagnosis not present

## 2021-06-26 DIAGNOSIS — Z4801 Encounter for change or removal of surgical wound dressing: Secondary | ICD-10-CM | POA: Diagnosis not present

## 2021-06-26 DIAGNOSIS — I10 Essential (primary) hypertension: Secondary | ICD-10-CM | POA: Diagnosis not present

## 2021-06-27 DIAGNOSIS — Z471 Aftercare following joint replacement surgery: Secondary | ICD-10-CM | POA: Diagnosis not present

## 2021-06-27 DIAGNOSIS — Z96651 Presence of right artificial knee joint: Secondary | ICD-10-CM | POA: Diagnosis not present

## 2021-06-28 ENCOUNTER — Telehealth: Payer: Self-pay | Admitting: *Deleted

## 2021-06-28 DIAGNOSIS — Z471 Aftercare following joint replacement surgery: Secondary | ICD-10-CM | POA: Diagnosis not present

## 2021-06-28 DIAGNOSIS — G629 Polyneuropathy, unspecified: Secondary | ICD-10-CM | POA: Diagnosis not present

## 2021-06-28 DIAGNOSIS — G8929 Other chronic pain: Secondary | ICD-10-CM | POA: Diagnosis not present

## 2021-06-28 DIAGNOSIS — Z4801 Encounter for change or removal of surgical wound dressing: Secondary | ICD-10-CM | POA: Diagnosis not present

## 2021-06-28 DIAGNOSIS — I10 Essential (primary) hypertension: Secondary | ICD-10-CM | POA: Diagnosis not present

## 2021-06-28 DIAGNOSIS — M545 Low back pain, unspecified: Secondary | ICD-10-CM | POA: Diagnosis not present

## 2021-06-28 NOTE — Telephone Encounter (Signed)
Enid Derry from radiation called and scheduled a follow up appt for 5/26 at 3:15 pm with Dr Berline Lopes. Enid Derry to contact the patient  ?

## 2021-06-28 NOTE — Telephone Encounter (Signed)
CALLED PATIENT TO INFORM OF FU WITH DR. Berline Lopes ON 09-08-21 - ARRIVAL TIME- 2:45 PM, SPOKE WITH PATIENT AND SHE IS AWARE OF THIS APPT. ?

## 2021-06-30 DIAGNOSIS — I10 Essential (primary) hypertension: Secondary | ICD-10-CM | POA: Diagnosis not present

## 2021-06-30 DIAGNOSIS — G629 Polyneuropathy, unspecified: Secondary | ICD-10-CM | POA: Diagnosis not present

## 2021-06-30 DIAGNOSIS — M545 Low back pain, unspecified: Secondary | ICD-10-CM | POA: Diagnosis not present

## 2021-06-30 DIAGNOSIS — Z4801 Encounter for change or removal of surgical wound dressing: Secondary | ICD-10-CM | POA: Diagnosis not present

## 2021-06-30 DIAGNOSIS — Z471 Aftercare following joint replacement surgery: Secondary | ICD-10-CM | POA: Diagnosis not present

## 2021-06-30 DIAGNOSIS — G8929 Other chronic pain: Secondary | ICD-10-CM | POA: Diagnosis not present

## 2021-07-03 DIAGNOSIS — G8929 Other chronic pain: Secondary | ICD-10-CM | POA: Diagnosis not present

## 2021-07-03 DIAGNOSIS — G629 Polyneuropathy, unspecified: Secondary | ICD-10-CM | POA: Diagnosis not present

## 2021-07-03 DIAGNOSIS — I10 Essential (primary) hypertension: Secondary | ICD-10-CM | POA: Diagnosis not present

## 2021-07-03 DIAGNOSIS — M545 Low back pain, unspecified: Secondary | ICD-10-CM | POA: Diagnosis not present

## 2021-07-03 DIAGNOSIS — Z4801 Encounter for change or removal of surgical wound dressing: Secondary | ICD-10-CM | POA: Diagnosis not present

## 2021-07-03 DIAGNOSIS — Z471 Aftercare following joint replacement surgery: Secondary | ICD-10-CM | POA: Diagnosis not present

## 2021-08-08 DIAGNOSIS — Z96651 Presence of right artificial knee joint: Secondary | ICD-10-CM | POA: Diagnosis not present

## 2021-08-08 DIAGNOSIS — Z471 Aftercare following joint replacement surgery: Secondary | ICD-10-CM | POA: Diagnosis not present

## 2021-08-23 ENCOUNTER — Other Ambulatory Visit: Payer: Self-pay

## 2021-08-23 ENCOUNTER — Inpatient Hospital Stay: Payer: Medicare Other | Attending: Hematology and Oncology

## 2021-08-23 DIAGNOSIS — Z9221 Personal history of antineoplastic chemotherapy: Secondary | ICD-10-CM | POA: Insufficient documentation

## 2021-08-23 DIAGNOSIS — Z452 Encounter for adjustment and management of vascular access device: Secondary | ICD-10-CM | POA: Diagnosis not present

## 2021-08-23 DIAGNOSIS — C541 Malignant neoplasm of endometrium: Secondary | ICD-10-CM | POA: Diagnosis not present

## 2021-08-23 DIAGNOSIS — Z90710 Acquired absence of both cervix and uterus: Secondary | ICD-10-CM | POA: Diagnosis not present

## 2021-08-23 DIAGNOSIS — Z90722 Acquired absence of ovaries, bilateral: Secondary | ICD-10-CM | POA: Insufficient documentation

## 2021-08-23 MED ORDER — SODIUM CHLORIDE 0.9% FLUSH
10.0000 mL | Freq: Once | INTRAVENOUS | Status: AC
  Administered 2021-08-23: 10 mL

## 2021-08-23 MED ORDER — HEPARIN SOD (PORK) LOCK FLUSH 100 UNIT/ML IV SOLN
500.0000 [IU] | Freq: Once | INTRAVENOUS | Status: AC
  Administered 2021-08-23: 500 [IU]

## 2021-08-30 ENCOUNTER — Encounter: Payer: Self-pay | Admitting: Gynecologic Oncology

## 2021-09-04 DIAGNOSIS — I10 Essential (primary) hypertension: Secondary | ICD-10-CM | POA: Diagnosis not present

## 2021-09-04 DIAGNOSIS — F4322 Adjustment disorder with anxiety: Secondary | ICD-10-CM | POA: Diagnosis not present

## 2021-09-04 DIAGNOSIS — N1831 Chronic kidney disease, stage 3a: Secondary | ICD-10-CM | POA: Diagnosis not present

## 2021-09-04 DIAGNOSIS — Z131 Encounter for screening for diabetes mellitus: Secondary | ICD-10-CM | POA: Diagnosis not present

## 2021-09-04 DIAGNOSIS — C55 Malignant neoplasm of uterus, part unspecified: Secondary | ICD-10-CM | POA: Diagnosis not present

## 2021-09-08 ENCOUNTER — Other Ambulatory Visit: Payer: Self-pay

## 2021-09-08 ENCOUNTER — Encounter: Payer: Self-pay | Admitting: Gynecologic Oncology

## 2021-09-08 ENCOUNTER — Inpatient Hospital Stay (HOSPITAL_BASED_OUTPATIENT_CLINIC_OR_DEPARTMENT_OTHER): Payer: Medicare Other | Admitting: Gynecologic Oncology

## 2021-09-08 VITALS — BP 136/68 | HR 80 | Temp 98.0°F | Resp 18 | Wt 203.0 lb

## 2021-09-08 DIAGNOSIS — Z90722 Acquired absence of ovaries, bilateral: Secondary | ICD-10-CM | POA: Diagnosis not present

## 2021-09-08 DIAGNOSIS — Z452 Encounter for adjustment and management of vascular access device: Secondary | ICD-10-CM | POA: Diagnosis not present

## 2021-09-08 DIAGNOSIS — Z90710 Acquired absence of both cervix and uterus: Secondary | ICD-10-CM | POA: Diagnosis not present

## 2021-09-08 DIAGNOSIS — Z8542 Personal history of malignant neoplasm of other parts of uterus: Secondary | ICD-10-CM | POA: Diagnosis not present

## 2021-09-08 DIAGNOSIS — C541 Malignant neoplasm of endometrium: Secondary | ICD-10-CM

## 2021-09-08 DIAGNOSIS — Z9221 Personal history of antineoplastic chemotherapy: Secondary | ICD-10-CM | POA: Diagnosis not present

## 2021-09-08 NOTE — Progress Notes (Signed)
Gynecologic Oncology Return Clinic Visit  09/08/2021  Reason for Visit: Surveillance visit in the setting of advanced stage uterine cancer  Treatment History: Oncology History Overview Note  Endometrioid, MSI High, MLH1 promoter hypermethylation present   Endometrial cancer ([place])  05/18/2019 Initial Biopsy   EMB: Complex atypical hyperplasia, cannot rule out microscopic focus of FIGO grade 1 carcinoma   05/18/2019 Initial Diagnosis   Presented in February 2021 with postmenopausal bleeding.  She was started on Provera and underwent endometrial biopsy showing complex atypical hyperplasia with a possible focus suspicious for grade 1 endometrial cancer.   05/21/2019 Imaging   Pelvic ultrasound at [facility]: Anteverted uterus measuring 8.2 cm in greatest dimension.  Endometrial lining measures 1.73 cm and is thickened.  Left ovary not visualized, right ovary normal in appearance.   06/30/2019 Surgery   Robotic-assisted laparoscopic total hysterectomy with bilateral salpingoophorectomy, bilateral pelvic LND  Frozen section showed endometrial cancer with more than 50% myometrial invasion.  Patient did not map to a sentinel lymph node bilaterally.   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  Tumor measures 3.5 cm and invades focally into the serosa.  No involvement of the cervix or bilateral adnexa.  1 of 20 lymph nodes positive for metastatic carcinoma, focus measures 1 cm without evidence of extracapsular extension.  FINAL MICROSCOPIC DIAGNOSIS: A. UTERUS, CERVIX, BILATERAL FALLOPIAN TUBES AND OVARIES, HYSTERECTOMY WITH SALPINGECTOMY: - Endometrioid adenocarcinoma, 3.5 cm, FIGO grade 1 - Carcinoma invades through the entire myometrium and focally into the serosal surface - Benign unremarkable cervix - Benign unremarkable bilateral fallopian tubes and left ovary - Benign serous inclusion cyst of right ovary - See oncology table B. LYMPH NODE,  RIGHT PELVIC, BIOPSY: - Metastatic carcinoma to one of twelve lymph nodes (1/12) - Focus of metastatic carcinoma measures 1.0 cm, without evidence of extracapsular extension C. LYMPH NODE, LEFT PELVIC, BIOPSY: - Eight benign lymph nodes (0/8) ONCOLOGY TABLE: UTERUS, CARCINOMA OR CARCINOSARCOMA Procedure: Total hysterectomy and bilateral salpingo-oophorectomy Histologic type: Endometrioid adenocarcinoma Histologic Grade: FIGO grade 1 Myometrial invasion: Depth of invasion: 30 mm Myometrial thickness: 30 mm Uterine Serosa Involvement: Present, focal Cervical stromal involvement: Not identified Extent of involvement of other organs: Not identified Lymphovascular invasion: Not identified Regional Lymph Nodes: Examined: 0 Sentinel 20 Non-sentinel 20 Total Lymph nodes with metastasis: 1 Isolated tumor cells (<0.2 mm): 0 Micrometastasis: (>0.2 mm and < 2.0 mm): 0 Macrometastasis: (>2.0 mm): 1 Extracapsular extension: Not identified Representative Tumor Block: A6 MMR / MSI testing: Will be ordered Pathologic Stage Classification (pTNM, AJCC 8th edition): pT3, pN1a Comments: None Mismatch Repair Protein (IHC) SUMMARY INTERPRETATION: ABNORMAL 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 The loss of expression may be secondary to promoter hyper-methylation, gene mutation or other genetic event. BRAF mutation testing and/or MLH1 methylation testing is indicated. The presence of a BRAF mutation and/or MLH1 hypermethylation is indicative of a sporadic-type tumor. The absence of either BRAF mutation and/or presence of normal methylation indicate the possible presence of a hereditary germline mutation (e.g.Lynch syndrome) and referral to genetic counseling is warranted. It is recommended that the loss of protein expression be correlated with molecular based MSI testing   06/30/2019 Cancer Staging   Staging form: Corpus Uteri - Carcinoma and Carcinosarcoma, AJCC 8th Edition -  Clinical stage from 06/30/2019: FIGO Stage IIIC1 (cT3, cN1a, cM0) - Signed by Lafonda Mosses, MD on 07/09/2019    07/16/2019 Imaging   1. No evidence  of metastatic disease in the chest, abdomen or pelvis. 2. Status post hysterectomy. Small volume simple ascites. Small amount of ill-defined fluid in the pelvic sidewalls, compatible with recent surgery. 3. Small umbilical hernia containing fat and trace ascitic fluid. 4. Chronic findings include: Aortic Atherosclerosis (ICD10-I70.0). Small hiatal hernia. Mild sigmoid diverticulosis   07/29/2019 Procedure   Ultrasound and fluoroscopically guided right internal jugular single lumen power port catheter insertion. Tip in the SVC/RA junction. Catheter ready for use.   08/03/2019 - 12/02/2019 Chemotherapy   The patient had carboplatin and taxol for chemotherapy treatment.     12/29/2019 Imaging   1. Enlargement of a left external iliac node, borderline sized. This could be reactive or represent early/isolated nodal metastasis. 2. Otherwise, no evidence of metastatic disease in the abdomen or pelvis. 3. Relative hyperattenuation surrounding the gallbladder is favored to be related to hepatic steatosis and sparing. Recommend attention on follow-up. 4. Degraded evaluation of the pelvis, secondary to beam hardening artifact from left hip arthroplasty. 5. Small hiatal hernia. 6. Decrease in trace pelvic fluid. 7. Aortic Atherosclerosis (ICD10-I70.0).   03/30/2020 Imaging   CT A/P: 1. No definite recurrent or metastatic disease in a patient with history of uterine/cervical cancer. Please note limited evaluation of the pelvis due to streak artifact originating from left femoral surgical hardware. 2. Interval development of a nonspecific prominent 0.8 cm retroperitoneal lymph node. Attention on follow-up. 3. Stable prominent bilateral [place] lymph nodes measuring up to 0.9 cm on the left. Attention on follow-up. 4. Small hiatal hernia, likely of the  paraesophageal type. 5. Scattered colonic diverticulosis with no acute diverticulitis. 6.  Aortic Atherosclerosis (ICD10-I70.0).   06/13/2020 Imaging   CT A/P: No evidence of recurrent or metastatic disease.   Stable left retroperitoneal and left external iliac lymph node. No new or enlarging lymph nodes.   Hepatic steatosis.   Aortic atherosclerosis.     Interval History: Patient reports doing well.  She denies any vaginal bleeding or discharge.  She is using her vaginal dilator although a little bit less frequently.  She had her knee replaced in March, has recovered well.  Reports intermittent constipation, improved recently.  Notes that her primary care provider is sending her to a nephrologist because of her kidney function.  She denies any urinary symptoms.  Denies any abdominal or pelvic pain.  Lost her son, with whom she was very close, suddenly in December.  Has been somewhat depressed, eating less and thus has lost some weight.  Past Medical/Surgical History: Past Medical History:  Diagnosis Date   Cataract    Complex endometrial hyperplasia with atypia    Dyspnea    endometrial ca 06/2019   Family history of colon cancer    Family history of lung cancer    Family history of melanoma    Family history of prostate cancer    GERD (gastroesophageal reflux disease)    Heart murmur    as a child   History of radiation therapy    endometrium, vaginal brachytherapy Dr. Gery Pray 09/07/19-10/14/19   Hypertension    Obesity    Pneumonia 2018   Post-menopausal bleeding 05/18/2019    Past Surgical History:  Procedure Laterality Date   BIOPSY ENDOMETRIAL     CATARACT EXTRACTION  03/2019   ESOPHAGOGASTRODUODENOSCOPY     IR IMAGING GUIDED PORT INSERTION  07/29/2019   REPLACEMENT TOTAL KNEE Right 06/19/2021   ROBOTIC ASSISTED TOTAL HYSTERECTOMY WITH BILATERAL SALPINGO OOPHERECTOMY Bilateral 06/30/2019   Procedure: XI ROBOTIC ASSISTED  TOTAL HYSTERECTOMY WITH BILATERAL  SALPINGO OOPHORECTOMY, BILATERAL LYMPH NODE DISSECTION;  Surgeon: Lafonda Mosses, MD;  Location: [facility];  Service: Gynecology;  Laterality: Bilateral;   SENTINEL NODE BIOPSY N/A 06/30/2019   Procedure: SENTINEL LYMPH NODE BIOPSY;  Surgeon: Lafonda Mosses, MD;  Location: [facility];  Service: Gynecology;  Laterality: N/A;   TONSILLECTOMY     TOTAL HIP ARTHROPLASTY Left    WISDOM TOOTH EXTRACTION      Family History  Problem Relation Age of Onset   Colon cancer Mother 40       treated with surgery only   Lung cancer Mother        dx in her mid-34s   CVA Father    Melanoma Sister        dx <60   Cancer Maternal Aunt        unknown type, dx >50   Melanoma Nephew        dx in his 63s   Endometrial cancer Neg Hx    Ovarian cancer Neg Hx    Breast cancer Neg Hx     Social History   Socioeconomic History   Marital status: Divorced    Spouse name: Not on file   Number of children: Not on file   Years of education: Not on file   Highest education level: Not on file  Occupational History   Not on file  Tobacco Use   Smoking status: Never   Smokeless tobacco: Never  Vaping Use   Vaping Use: Never used  Substance and Sexual Activity   Alcohol use: No   Drug use: Never   Sexual activity: Not Currently  Other Topics Concern   Not on file  Social History Narrative   Not on file   Social Determinants of Health   Financial Resource Strain: Not on file  Food Insecurity: Not on file  Transportation Needs: Not on file  Physical Activity: Not on file  Stress: Not on file  Social Connections: Not on file    Current Medications:  Current Outpatient Medications:    allopurinol (ZYLOPRIM) 100 MG tablet, Take 100 mg by mouth daily., Disp: , Rfl:    aspirin EC 325 MG EC tablet, Take by mouth., Disp: , Rfl:    B Complex Vitamins (B COMPLEX PO), Take 1 Dose by mouth daily. Liquid b complex, Disp: , Rfl:    benazepril-hydrochlorthiazide (LOTENSIN HCT) 20-25 MG tablet, Take 1  tablet by mouth daily., Disp: , Rfl:    cyanocobalamin 1000 MCG tablet, Take by mouth., Disp: , Rfl:    diphenhydrAMINE (BENADRYL) 25 MG tablet, Take 50 mg by mouth daily as needed for allergies., Disp: , Rfl:    escitalopram (LEXAPRO) 10 MG tablet, Take 10 mg by mouth daily., Disp: , Rfl:    gabapentin (NEURONTIN) 600 MG tablet, Take 600-1,200 mg by mouth See admin instructions. Take 1200 mg in the morning, 600 mg in the afternoon, and 600 mg at night, Disp: , Rfl:    Green Tea, Camellia sinensis, (GREEN TEA EXTRACT PO), Take by mouth., Disp: , Rfl:    LINZESS 145 MCG CAPS capsule, Take 145 mcg by mouth daily., Disp: , Rfl:    Magnesium Hydroxide 1200 MG CHEW, Chew by mouth., Disp: , Rfl:    meloxicam (MOBIC) 15 MG tablet, Take 15 mg by mouth daily in the afternoon. , Disp: , Rfl:    NATURAL PSYLLIUM SEED PO, Take by mouth., Disp: , Rfl:    omeprazole (  PRILOSEC OTC) 20 MG tablet, Take by mouth., Disp: , Rfl:    Oxymetazoline HCl (VICKS SINEX 12 HOUR NA), Place 1 spray into the nose 2 (two) times daily as needed (congestion)., Disp: , Rfl:    tiZANidine (ZANAFLEX) 4 MG tablet, Take 4 mg by mouth at bedtime., Disp: , Rfl:    topiramate (TOPAMAX) 25 MG tablet, Take 75 mg by mouth at bedtime., Disp: , Rfl:    topiramate (TOPAMAX) 25 MG tablet, Take by mouth., Disp: , Rfl:    Melatonin 5 MG CAPS, Take 5 mg by mouth at bedtime. (Patient not taking: Reported on 08/30/2021), Disp: , Rfl:   Review of Systems: + Intermittent constipation, depression. Denies appetite changes, fevers, chills, fatigue, unexplained weight changes. Denies hearing loss, neck lumps or masses, mouth sores, ringing in ears or voice changes. Denies cough or wheezing.  Denies shortness of breath. Denies chest pain or palpitations. Denies leg swelling. Denies abdominal distention, pain, blood in stools, diarrhea, nausea, vomiting, or early satiety. Denies pain with intercourse, dysuria, frequency, hematuria or  incontinence. Denies hot flashes, pelvic pain, vaginal bleeding or vaginal discharge.   Denies joint pain, back pain or muscle pain/cramps. Denies itching, rash, or wounds. Denies dizziness, headaches, numbness or seizures. Denies swollen lymph nodes or glands, denies easy bruising or bleeding. Denies anxiety, depression, confusion, or decreased concentration.  Physical Exam: BP 136/68 (BP Location: Left Arm, Patient Position: Sitting)   Pulse 80   Temp 98 F (36.7 C)   Resp 18   Wt 203 lb (92.1 kg)   SpO2 97%   BMI 35.96 kg/m  General: Alert, oriented, no acute distress. HEENT: Normocephalic, atraumatic, sclera anicteric. Chest: Clear to auscultation bilaterally.  No wheezes or rhonchi. Cardiovascular: Regular rate and rhythm, no murmurs. Abdomen: Obese, soft, nontender.  Normoactive bowel sounds.  No masses or hepatosplenomegaly appreciated.  Well-healed incisions. Extremities: Grossly normal range of motion.  Warm, well perfused.  No edema bilaterally. Skin: No rashes or lesions noted. Lymphatics: No cervical, supraclavicular, or inguinal adenopathy. GU: Normal appearing external genitalia without erythema, excoriation, or lesions.  Speculum exam reveals moderately atrophic vaginal mucosa, radiation changes noted.  No bleeding, discharge, masses appreciated.  Bimanual exam reveals cuff intact, no masses or nodularity.  Rectovaginal exam confirms these findings.  Laboratory & Radiologic Studies: None new  Assessment & Plan: Heidi Avila is a 76 y.o. woman with Stage IIIC1 grade 1 endometrioid endometrial adenocarcinoma who completed adjuvant therapy with carboplatin and paclitaxel with vaginal brachytherapy approximately August 2021.   Patient is doing very well and is NED on exam today.     Support offered given the recent loss of her son.   Per NCCN surveillance recommendations, we will continue with visits every 3 months.  She is scheduled to see Dr. Sondra Come in August.   At that point, she will be 2 years out from completion of adjuvant therapy.  If she remains NED, we will transition to visits every 6 months.  I will tentatively plan to see her in February.  28 minutes of total time was spent for this patient encounter, including preparation, face-to-face counseling with the patient and coordination of care, and documentation of the encounter.  Jeral Pinch, MD  Division of Gynecologic Oncology  Department of Obstetrics and Gynecology  [facility]

## 2021-09-08 NOTE — Patient Instructions (Signed)
It was good to see you today.  I do not see or feel any evidence of recurrent cancer.  We will continue with visits every 3 months until you are 2 years out from finishing treatment, which will be this fall.  After your next visit with Dr. Sondra Come, we will transition to visits every 6 months.  This will mean that I see you back in February 2024.  Please call sometime in December or January to get that visit scheduled with me.  As always, if you develop new symptoms concerning for cancer recurrence before then, such as vaginal bleeding or pelvic pain, please call to see me sooner.

## 2021-10-24 DIAGNOSIS — G629 Polyneuropathy, unspecified: Secondary | ICD-10-CM | POA: Diagnosis not present

## 2021-11-14 DIAGNOSIS — T39395A Adverse effect of other nonsteroidal anti-inflammatory drugs [NSAID], initial encounter: Secondary | ICD-10-CM | POA: Diagnosis not present

## 2021-11-14 DIAGNOSIS — N184 Chronic kidney disease, stage 4 (severe): Secondary | ICD-10-CM | POA: Diagnosis not present

## 2021-11-14 DIAGNOSIS — N1419 Nephropathy induced by other drugs, medicaments and biological substances: Secondary | ICD-10-CM | POA: Diagnosis not present

## 2021-11-14 DIAGNOSIS — M1A3790 Chronic gout due to renal impairment, unspecified ankle and foot, without tophus (tophi): Secondary | ICD-10-CM | POA: Diagnosis not present

## 2021-11-14 DIAGNOSIS — I129 Hypertensive chronic kidney disease with stage 1 through stage 4 chronic kidney disease, or unspecified chronic kidney disease: Secondary | ICD-10-CM | POA: Diagnosis not present

## 2021-11-16 DIAGNOSIS — N184 Chronic kidney disease, stage 4 (severe): Secondary | ICD-10-CM | POA: Diagnosis not present

## 2021-11-16 DIAGNOSIS — N2889 Other specified disorders of kidney and ureter: Secondary | ICD-10-CM | POA: Diagnosis not present

## 2021-11-16 DIAGNOSIS — I10 Essential (primary) hypertension: Secondary | ICD-10-CM | POA: Diagnosis not present

## 2021-11-16 DIAGNOSIS — I129 Hypertensive chronic kidney disease with stage 1 through stage 4 chronic kidney disease, or unspecified chronic kidney disease: Secondary | ICD-10-CM | POA: Diagnosis not present

## 2021-11-25 NOTE — Progress Notes (Incomplete)
Radiation Oncology         ([phone]) [phone] ________________________________  Name: Heidi Avila MRN: [id number]  Date: 11/27/2021  DOB: 01-22-1946  Follow-Up Visit Note  CC: [facility], MD  No diagnosis found.  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 Since her last visit, the patient followed up with Dr. Berline Lopes on 09/08/21. During which time, the patient denied any symptoms concerning for disease recurrence and was noted as NED on examination. The patient did report intermittent constipation which had improved. She also reported using her vaginal dilator, although a little bit less frequently than in the past.  Of note: The patient was noted to be dealing with depression due to the loss of her son this past December. This has caused  her to loose weight from eating less.                          Otherwise, no significant interval history since the patient was last seen.   ***      Allergies:  is allergic to other, amoxicillin, and latex.  Meds: Current Outpatient Medications  Medication Sig Dispense Refill   allopurinol (ZYLOPRIM) 100 MG tablet Take 100 mg by mouth daily.     aspirin EC 325 MG EC tablet Take by mouth.     B Complex Vitamins (B COMPLEX PO) Take 1 Dose by mouth daily. Liquid b complex     benazepril-hydrochlorthiazide (LOTENSIN HCT) 20-25 MG tablet Take 1 tablet by mouth daily.     cyanocobalamin 1000 MCG tablet Take by mouth.     diphenhydrAMINE (BENADRYL) 25 MG tablet Take 50 mg by mouth daily as needed for allergies.     escitalopram (LEXAPRO) 10 MG tablet Take 10 mg by mouth daily.      gabapentin (NEURONTIN) 600 MG tablet Take 600-1,200 mg by mouth See admin instructions. Take 1200 mg in the morning, 600 mg in the afternoon, and 600 mg at night     Green Tea, Camellia sinensis, (GREEN TEA EXTRACT PO) Take by mouth.     LINZESS 145 MCG CAPS capsule Take 145 mcg by mouth daily.     Magnesium Hydroxide 1200 MG CHEW Chew by mouth.     Melatonin 5 MG CAPS Take 5 mg by mouth at bedtime. (Patient not taking: Reported on 08/30/2021)     meloxicam (MOBIC) 15 MG tablet Take 15 mg by mouth daily in the afternoon.      NATURAL PSYLLIUM SEED PO Take by mouth.     omeprazole (PRILOSEC OTC) 20 MG tablet Take by mouth.     Oxymetazoline HCl (VICKS SINEX 12 HOUR NA) Place 1 spray into the nose 2 (two) times daily as needed (congestion).     tiZANidine (ZANAFLEX) 4 MG tablet Take 4 mg by mouth at bedtime.     topiramate (TOPAMAX) 25 MG tablet Take 75 mg by mouth at bedtime.     topiramate (TOPAMAX) 25 MG tablet Take by mouth.     No current facility-administered medications for this encounter.    Physical Findings: The patient is in no acute  distress. Patient is alert and oriented.  vitals were not taken for this visit. .  No significant changes. Lungs are clear to auscultation bilaterally. Heart has regular rate and rhythm. No palpable cervical, supraclavicular, or axillary adenopathy. Abdomen soft, non-tender, normal bowel sounds.  On pelvic examination the external genitalia were unremarkable. A speculum exam was performed. There are no mucosal lesions noted in the vaginal vault. A Pap smear was obtained of the proximal vagina. On bimanual and rectovaginal examination there were no pelvic masses appreciated. ***    Lab Findings: Lab Results  Component Value Date   WBC 5.8 12/29/2020   HGB 11.3 (L) 12/29/2020   HCT 33.2 (L) 12/29/2020   MCV 88.1 12/29/2020   PLT 211 12/29/2020    Radiographic Findings: No results found.  Impression: Stage IIIC1 grade 1 endometrioid  endometrial adenocarcinoma  The patient is recovering from the effects of radiation.  ***  Plan:  ***   *** minutes of total time was spent for this patient encounter, including preparation, face-to-face counseling with the patient and coordination of care, physical exam, and documentation of the encounter. ____________________________________  [name], PhD, MD  This document serves as a record of services personally performed by Gery Pray, MD. It was created on his behalf by Roney Mans, a trained medical scribe. The creation of this record is based on the scribe's personal observations and the provider's statements to them. This document has been checked and approved by the attending provider.

## 2021-11-27 ENCOUNTER — Other Ambulatory Visit: Payer: Self-pay

## 2021-11-27 ENCOUNTER — Ambulatory Visit
Admission: RE | Admit: 2021-11-27 | Discharge: 2021-11-27 | Disposition: A | Discharge status: Home / Self Care | Payer: Medicare Other | Source: Ambulatory Visit | Attending: Radiation Oncology | Admitting: Radiation Oncology

## 2021-11-27 ENCOUNTER — Encounter: Payer: Self-pay | Admitting: Radiation Oncology

## 2021-11-27 DIAGNOSIS — C541 Malignant neoplasm of endometrium: Secondary | ICD-10-CM

## 2021-11-27 DIAGNOSIS — Z923 Personal history of irradiation: Secondary | ICD-10-CM | POA: Diagnosis not present

## 2021-11-27 DIAGNOSIS — Z79899 Other long term (current) drug therapy: Secondary | ICD-10-CM | POA: Diagnosis not present

## 2021-11-27 DIAGNOSIS — K5900 Constipation, unspecified: Secondary | ICD-10-CM | POA: Diagnosis not present

## 2021-11-27 DIAGNOSIS — Z8542 Personal history of malignant neoplasm of other parts of uterus: Secondary | ICD-10-CM | POA: Insufficient documentation

## 2021-11-27 NOTE — Progress Notes (Signed)
[facility] is here today for follow up post radiation to the pelvic.  They completed their radiation on: 10/14/19   Does the patient complain of any of the following:  Pain:No Abdominal bloating: No Diarrhea/Constipation: Constipation, on linzess.  Nausea/Vomiting: No Vaginal Discharge: Yes, with odor.  Blood in Urine or Stool: No Urinary Issues (dysuria/incomplete emptying/ incontinence/ increased frequency/urgency): No Does patient report using vaginal dilator 2-3 times a week and/or sexually active 2-3 weeks: No Post radiation skin changes: No  Additional comments if applicable:    BP (!) 749/35 (BP Location: Left Arm, Patient Position: Sitting)   Pulse 65   Temp (!) 96.8 F (36 C) (Temporal)   Resp 18   Ht '5\' 3"'$  (1.6 m)   Wt 198 lb 2 oz (89.9 kg)   SpO2 98%   BMI 35.10 kg/m

## 2021-12-04 DIAGNOSIS — N184 Chronic kidney disease, stage 4 (severe): Secondary | ICD-10-CM | POA: Diagnosis not present

## 2021-12-04 DIAGNOSIS — I10 Essential (primary) hypertension: Secondary | ICD-10-CM | POA: Diagnosis not present

## 2021-12-04 DIAGNOSIS — C55 Malignant neoplasm of uterus, part unspecified: Secondary | ICD-10-CM | POA: Diagnosis not present

## 2021-12-04 DIAGNOSIS — F4322 Adjustment disorder with anxiety: Secondary | ICD-10-CM | POA: Diagnosis not present

## 2021-12-28 ENCOUNTER — Encounter: Payer: Self-pay | Admitting: Hematology and Oncology

## 2021-12-28 ENCOUNTER — Inpatient Hospital Stay: Payer: Medicare Other | Attending: Hematology and Oncology

## 2021-12-28 ENCOUNTER — Other Ambulatory Visit: Payer: Self-pay

## 2021-12-28 ENCOUNTER — Other Ambulatory Visit: Payer: Self-pay | Admitting: Hematology and Oncology

## 2021-12-28 ENCOUNTER — Inpatient Hospital Stay (HOSPITAL_BASED_OUTPATIENT_CLINIC_OR_DEPARTMENT_OTHER): Payer: Medicare Other | Admitting: Hematology and Oncology

## 2021-12-28 VITALS — BP 126/70 | HR 71 | Temp 98.5°F | Resp 18 | Ht 63.0 in | Wt 197.4 lb

## 2021-12-28 DIAGNOSIS — N184 Chronic kidney disease, stage 4 (severe): Secondary | ICD-10-CM

## 2021-12-28 DIAGNOSIS — D631 Anemia in chronic kidney disease: Secondary | ICD-10-CM | POA: Diagnosis not present

## 2021-12-28 DIAGNOSIS — N183 Chronic kidney disease, stage 3 unspecified: Secondary | ICD-10-CM | POA: Diagnosis not present

## 2021-12-28 DIAGNOSIS — C541 Malignant neoplasm of endometrium: Secondary | ICD-10-CM

## 2021-12-28 LAB — COMPREHENSIVE METABOLIC PANEL
ALT: 7 U/L (ref 0–44)
AST: 11 U/L — ABNORMAL LOW (ref 15–41)
Albumin: 4.1 g/dL (ref 3.5–5.0)
Alkaline Phosphatase: 40 U/L (ref 38–126)
Anion gap: 7 (ref 5–15)
BUN: 25 mg/dL — ABNORMAL HIGH (ref 8–23)
CO2: 27 mmol/L (ref 22–32)
Calcium: 9.7 mg/dL (ref 8.9–10.3)
Chloride: 104 mmol/L (ref 98–111)
Creatinine, Ser: 2 mg/dL — ABNORMAL HIGH (ref 0.44–1.00)
GFR, Estimated: 26 mL/min — ABNORMAL LOW (ref >60)
Glucose, Bld: 109 mg/dL — ABNORMAL HIGH (ref 70–99)
Potassium: 3.4 mmol/L — ABNORMAL LOW (ref 3.5–5.1)
Sodium: 138 mmol/L (ref 135–145)
Total Bilirubin: 0.5 mg/dL (ref 0.3–1.2)
Total Protein: 6.5 g/dL (ref 6.5–8.1)

## 2021-12-28 LAB — CBC WITH DIFFERENTIAL/PLATELET
Abs Immature Granulocytes: 0.02 10*3/uL (ref 0.00–0.07)
Basophils Absolute: 0.1 10*3/uL (ref 0.0–0.1)
Basophils Relative: 1 %
Eosinophils Absolute: 0.2 10*3/uL (ref 0.0–0.5)
Eosinophils Relative: 4 %
HCT: 33.2 % — ABNORMAL LOW (ref 36.0–46.0)
Hemoglobin: 11.2 g/dL — ABNORMAL LOW (ref 12.0–15.0)
Immature Granulocytes: 0 %
Lymphocytes Relative: 17 %
Lymphs Abs: 1 10*3/uL (ref 0.7–4.0)
MCH: 30.2 pg (ref 26.0–34.0)
MCHC: 33.7 g/dL (ref 30.0–36.0)
MCV: 89.5 fL (ref 80.0–100.0)
Monocytes Absolute: 0.6 10*3/uL (ref 0.1–1.0)
Monocytes Relative: 10 %
Neutro Abs: 4.1 10*3/uL (ref 1.7–7.7)
Neutrophils Relative %: 68 %
Platelets: 279 10*3/uL (ref 150–400)
RBC: 3.71 MIL/uL — ABNORMAL LOW (ref 3.87–5.11)
RDW: 13.2 % (ref 11.5–15.5)
WBC: 6.1 10*3/uL (ref 4.0–10.5)
nRBC: 0 % (ref 0.0–0.2)

## 2021-12-28 MED ORDER — HEPARIN SOD (PORK) LOCK FLUSH 100 UNIT/ML IV SOLN
500.0000 [IU] | Freq: Once | INTRAVENOUS | Status: AC
  Administered 2021-12-28: 500 [IU]

## 2021-12-28 MED ORDER — SODIUM CHLORIDE 0.9% FLUSH
10.0000 mL | Freq: Once | INTRAVENOUS | Status: AC
  Administered 2021-12-28: 10 mL

## 2021-12-28 NOTE — Progress Notes (Signed)
[place] OFFICE PROGRESS NOTE  Patient Care Team: Physicians, [facility] as PCP - General (Family Medicine) Gery Pray, MD as Consulting Physician (Radiation Oncology)  ASSESSMENT & PLAN:  Endometrial cancer [facility]) Her exam is limited due to body habitus but overall, she have no signs or symptoms to suggest cancer recurrence We discussed port maintenance I recommend she keeps her port for 2 years after treatment I plan to repeat CT imaging next week If she has no signs of disease, I will get her port removed  CKD (chronic kidney disease), stage III She has slight worsening kidney function Recommend increase fluid hydration and aggressive risk factor modification with close follow-up with primary care doctor I plan to order CT imaging without contrast because of this  Anemia in chronic kidney disease This is likely anemia of chronic disease. The patient denies recent history of bleeding such as epistaxis, hematuria or hematochezia. She is asymptomatic from the anemia. We will observe for now.    No orders of the defined types were placed in this encounter.   All questions were answered. The patient knows to call the clinic with any problems, questions or concerns. The total time spent in the appointment was 30 minutes encounter with patients including review of chart and various tests results, discussions about plan of care and coordination of care plan   Heath Lark, MD 12/28/2021 3:30 PM  INTERVAL HISTORY: Please see below for problem oriented charting. she returns for surveillance follow-up for history of stage III uterine cancer She denies abdominal pain, bloating or changes in bowel habits No recent abnormal vaginal discharge Her weight has been stable It has been almost 2 years since we have finished her treatment  REVIEW OF SYSTEMS:   Constitutional: Denies fevers, chills or abnormal weight loss Eyes: Denies blurriness of vision Ears, nose,  mouth, throat, and face: Denies mucositis or sore throat Respiratory: Denies cough, dyspnea or wheezes Cardiovascular: Denies palpitation, chest discomfort or lower extremity swelling Gastrointestinal:  Denies nausea, heartburn or change in bowel habits Skin: Denies abnormal skin rashes Lymphatics: Denies new lymphadenopathy or easy bruising Neurological:Denies numbness, tingling or new weaknesses Behavioral/Psych: Mood is stable, no new changes  All other systems were reviewed with the patient and are negative.  I have reviewed the past medical history, past surgical history, social history and family history with the patient and they are unchanged from previous note.  ALLERGIES:  is allergic to other, amoxicillin, and latex.  MEDICATIONS:  Current Outpatient Medications  Medication Sig Dispense Refill   allopurinol (ZYLOPRIM) 100 MG tablet Take 100 mg by mouth daily.     aspirin EC 325 MG EC tablet Take by mouth.     B Complex Vitamins (B COMPLEX PO) Take 1 Dose by mouth daily. Liquid b complex     benazepril-hydrochlorthiazide (LOTENSIN HCT) 20-25 MG tablet Take 1 tablet by mouth daily.     cyanocobalamin 1000 MCG tablet Take by mouth.     diphenhydrAMINE (BENADRYL) 25 MG tablet Take 50 mg by mouth daily as needed for allergies.     escitalopram (LEXAPRO) 10 MG tablet Take 10 mg by mouth daily.     Green Tea, Camellia sinensis, (GREEN TEA EXTRACT PO) Take by mouth.     LINZESS 145 MCG CAPS capsule Take 145 mcg by mouth daily.     Magnesium Hydroxide 1200 MG CHEW Chew by mouth.     NATURAL PSYLLIUM SEED PO Take by mouth.     omeprazole (  PRILOSEC OTC) 20 MG tablet Take by mouth.     Oxymetazoline HCl (VICKS SINEX 12 HOUR NA) Place 1 spray into the nose 2 (two) times daily as needed (congestion).     tiZANidine (ZANAFLEX) 4 MG tablet Take 4 mg by mouth at bedtime.     topiramate (TOPAMAX) 25 MG tablet Take 75 mg by mouth at bedtime.     No current facility-administered medications  for this visit.    SUMMARY OF ONCOLOGIC HISTORY: Oncology History Overview Note  Endometrioid, MSI High, MLH1 promoter hypermethylation present   Endometrial cancer ([place])  05/18/2019 Initial Biopsy   EMB: Complex atypical hyperplasia, cannot rule out microscopic focus of FIGO grade 1 carcinoma   05/18/2019 Initial Diagnosis   Presented in February 2021 with postmenopausal bleeding.  She was started on Provera and underwent endometrial biopsy showing complex atypical hyperplasia with a possible focus suspicious for grade 1 endometrial cancer.   05/21/2019 Imaging   Pelvic ultrasound at [facility]: Anteverted uterus measuring 8.2 cm in greatest dimension.  Endometrial lining measures 1.73 cm and is thickened.  Left ovary not visualized, right ovary normal in appearance.   06/30/2019 Surgery   Robotic-assisted laparoscopic total hysterectomy with bilateral salpingoophorectomy, bilateral pelvic LND  Frozen section showed endometrial cancer with more than 50% myometrial invasion.  Patient did not map to a sentinel lymph node bilaterally.   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  Tumor measures 3.5 cm and invades focally into the serosa.  No involvement of the cervix or bilateral adnexa.  1 of 20 lymph nodes positive for metastatic carcinoma, focus measures 1 cm without evidence of extracapsular extension.  FINAL MICROSCOPIC DIAGNOSIS: A. UTERUS, CERVIX, BILATERAL FALLOPIAN TUBES AND OVARIES, HYSTERECTOMY WITH SALPINGECTOMY: - Endometrioid adenocarcinoma, 3.5 cm, FIGO grade 1 - Carcinoma invades through the entire myometrium and focally into the serosal surface - Benign unremarkable cervix - Benign unremarkable bilateral fallopian tubes and left ovary - Benign serous inclusion cyst of right ovary - See oncology table B. LYMPH NODE, RIGHT PELVIC, BIOPSY: - Metastatic carcinoma to one of twelve lymph nodes (1/12) - Focus of metastatic  carcinoma measures 1.0 cm, without evidence of extracapsular extension C. LYMPH NODE, LEFT PELVIC, BIOPSY: - Eight benign lymph nodes (0/8) ONCOLOGY TABLE: UTERUS, CARCINOMA OR CARCINOSARCOMA Procedure: Total hysterectomy and bilateral salpingo-oophorectomy Histologic type: Endometrioid adenocarcinoma Histologic Grade: FIGO grade 1 Myometrial invasion: Depth of invasion: 30 mm Myometrial thickness: 30 mm Uterine Serosa Involvement: Present, focal Cervical stromal involvement: Not identified Extent of involvement of other organs: Not identified Lymphovascular invasion: Not identified Regional Lymph Nodes: Examined: 0 Sentinel 20 Non-sentinel 20 Total Lymph nodes with metastasis: 1 Isolated tumor cells (<0.2 mm): 0 Micrometastasis: (>0.2 mm and < 2.0 mm): 0 Macrometastasis: (>2.0 mm): 1 Extracapsular extension: Not identified Representative Tumor Block: A6 MMR / MSI testing: Will be ordered Pathologic Stage Classification (pTNM, AJCC 8th edition): pT3, pN1a Comments: None Mismatch Repair Protein (IHC) SUMMARY INTERPRETATION: ABNORMAL 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 The loss of expression may be secondary to promoter hyper-methylation, gene mutation or other genetic event. BRAF mutation testing and/or MLH1 methylation testing is indicated. The presence of a BRAF mutation and/or MLH1 hypermethylation is indicative of a sporadic-type tumor. The absence of either BRAF mutation and/or presence of normal methylation indicate the possible presence of a hereditary germline mutation (e.g.Lynch syndrome) and referral to genetic counseling is warranted. It is recommended that the loss  of protein expression be correlated with molecular based MSI testing   06/30/2019 Cancer Staging   Staging form: Corpus Uteri - Carcinoma and Carcinosarcoma, AJCC 8th Edition - Clinical stage from 06/30/2019: FIGO Stage IIIC1 (cT3, cN1a, cM0) - Signed by Lafonda Mosses, MD on  07/09/2019   07/16/2019 Imaging   1. No evidence of metastatic disease in the chest, abdomen or pelvis. 2. Status post hysterectomy. Small volume simple ascites. Small amount of ill-defined fluid in the pelvic sidewalls, compatible with recent surgery. 3. Small umbilical hernia containing fat and trace ascitic fluid. 4. Chronic findings include: Aortic Atherosclerosis (ICD10-I70.0). Small hiatal hernia. Mild sigmoid diverticulosis   07/29/2019 Procedure   Ultrasound and fluoroscopically guided right internal jugular single lumen power port catheter insertion. Tip in the SVC/RA junction. Catheter ready for use.   08/03/2019 - 12/02/2019 Chemotherapy   The patient had carboplatin and taxol for chemotherapy treatment.     12/29/2019 Imaging   1. Enlargement of a left external iliac node, borderline sized. This could be reactive or represent early/isolated nodal metastasis. 2. Otherwise, no evidence of metastatic disease in the abdomen or pelvis. 3. Relative hyperattenuation surrounding the gallbladder is favored to be related to hepatic steatosis and sparing. Recommend attention on follow-up. 4. Degraded evaluation of the pelvis, secondary to beam hardening artifact from left hip arthroplasty. 5. Small hiatal hernia. 6. Decrease in trace pelvic fluid. 7. Aortic Atherosclerosis (ICD10-I70.0).   03/30/2020 Imaging   CT A/P: 1. No definite recurrent or metastatic disease in a patient with history of uterine/cervical cancer. Please note limited evaluation of the pelvis due to streak artifact originating from left femoral surgical hardware. 2. Interval development of a nonspecific prominent 0.8 cm retroperitoneal lymph node. Attention on follow-up. 3. Stable prominent bilateral [place] lymph nodes measuring up to 0.9 cm on the left. Attention on follow-up. 4. Small hiatal hernia, likely of the paraesophageal type. 5. Scattered colonic diverticulosis with no acute diverticulitis. 6.  Aortic  Atherosclerosis (ICD10-I70.0).   06/13/2020 Imaging   CT A/P: No evidence of recurrent or metastatic disease.   Stable left retroperitoneal and left external iliac lymph node. No new or enlarging lymph nodes.   Hepatic steatosis.   Aortic atherosclerosis.     PHYSICAL EXAMINATION: ECOG PERFORMANCE STATUS: 1 - Symptomatic but completely ambulatory  Vitals:   12/28/21 1246  BP: 126/70  Pulse: 71  Resp: 18  Temp: 98.5 F (36.9 C)  SpO2: 99%   Filed Weights   12/28/21 1246  Weight: 197 lb 6.4 oz (89.5 kg)    GENERAL:alert, no distress and comfortable NEURO: alert & oriented x 3 with fluent speech, no focal motor/sensory deficits  LABORATORY DATA:  I have reviewed the data as listed    Component Value Date/Time   NA 138 12/28/2021 1224   K 3.4 (L) 12/28/2021 1224   CL 104 12/28/2021 1224   CO2 27 12/28/2021 1224   GLUCOSE 109 (H) 12/28/2021 1224   BUN 25 (H) 12/28/2021 1224   CREATININE 2.00 (H) 12/28/2021 1224   CREATININE 1.62 (H) 12/30/2019 1300   CALCIUM 9.7 12/28/2021 1224   PROT 6.5 12/28/2021 1224   ALBUMIN 4.1 12/28/2021 1224   AST 11 (L) 12/28/2021 1224   AST 13 (L) 12/30/2019 1300   ALT 7 12/28/2021 1224   ALT 14 12/30/2019 1300   ALKPHOS 40 12/28/2021 1224   BILITOT 0.5 12/28/2021 1224   BILITOT 0.4 12/30/2019 1300   GFRNONAA 26 (L) 12/28/2021 1224   GFRNONAA 31 (L)  12/30/2019 1300   GFRAA 36 (L) 12/30/2019 1300    No results found for: "SPEP", "UPEP"  Lab Results  Component Value Date   WBC 6.1 12/28/2021   NEUTROABS 4.1 12/28/2021   HGB 11.2 (L) 12/28/2021   HCT 33.2 (L) 12/28/2021   MCV 89.5 12/28/2021   PLT 279 12/28/2021      Chemistry      Component Value Date/Time   NA 138 12/28/2021 1224   K 3.4 (L) 12/28/2021 1224   CL 104 12/28/2021 1224   CO2 27 12/28/2021 1224   BUN 25 (H) 12/28/2021 1224   CREATININE 2.00 (H) 12/28/2021 1224   CREATININE 1.62 (H) 12/30/2019 1300      Component Value Date/Time   CALCIUM 9.7  12/28/2021 1224   ALKPHOS 40 12/28/2021 1224   AST 11 (L) 12/28/2021 1224   AST 13 (L) 12/30/2019 1300   ALT 7 12/28/2021 1224   ALT 14 12/30/2019 1300   BILITOT 0.5 12/28/2021 1224   BILITOT 0.4 12/30/2019 1300

## 2021-12-28 NOTE — Assessment & Plan Note (Signed)
This is likely anemia of chronic disease. The patient denies recent history of bleeding such as epistaxis, hematuria or hematochezia. She is asymptomatic from the anemia. We will observe for now.  

## 2021-12-28 NOTE — Assessment & Plan Note (Signed)
She has slight worsening kidney function Recommend increase fluid hydration and aggressive risk factor modification with close follow-up with primary care doctor I plan to order CT imaging without contrast because of this

## 2021-12-28 NOTE — Assessment & Plan Note (Signed)
Her exam is limited due to body habitus but overall, she have no signs or symptoms to suggest cancer recurrence We discussed port maintenance I recommend she keeps her port for 2 years after treatment I plan to repeat CT imaging next week If she has no signs of disease, I will get her port removed

## 2022-01-04 ENCOUNTER — Ambulatory Visit (HOSPITAL_COMMUNITY)
Admission: RE | Admit: 2022-01-04 | Discharge: 2022-01-04 | Disposition: A | Discharge status: Home / Self Care | Payer: Medicare Other | Source: Ambulatory Visit | Attending: Hematology and Oncology | Admitting: Hematology and Oncology

## 2022-01-04 DIAGNOSIS — C541 Malignant neoplasm of endometrium: Secondary | ICD-10-CM | POA: Diagnosis not present

## 2022-01-04 DIAGNOSIS — K5730 Diverticulosis of large intestine without perforation or abscess without bleeding: Secondary | ICD-10-CM | POA: Diagnosis not present

## 2022-01-09 DIAGNOSIS — Z96651 Presence of right artificial knee joint: Secondary | ICD-10-CM | POA: Diagnosis not present

## 2022-01-09 DIAGNOSIS — Z471 Aftercare following joint replacement surgery: Secondary | ICD-10-CM | POA: Diagnosis not present

## 2022-01-12 ENCOUNTER — Other Ambulatory Visit: Payer: Self-pay

## 2022-01-12 ENCOUNTER — Inpatient Hospital Stay (HOSPITAL_BASED_OUTPATIENT_CLINIC_OR_DEPARTMENT_OTHER): Payer: Medicare Other | Admitting: Hematology and Oncology

## 2022-01-12 VITALS — BP 117/57 | HR 71 | Temp 98.7°F | Resp 18 | Ht 63.0 in | Wt 198.6 lb

## 2022-01-12 DIAGNOSIS — N183 Chronic kidney disease, stage 3 unspecified: Secondary | ICD-10-CM

## 2022-01-12 DIAGNOSIS — C541 Malignant neoplasm of endometrium: Secondary | ICD-10-CM

## 2022-01-12 DIAGNOSIS — D631 Anemia in chronic kidney disease: Secondary | ICD-10-CM | POA: Diagnosis not present

## 2022-01-13 NOTE — Assessment & Plan Note (Signed)
She has no signs of cancer recurrence We will get her port removed I will see her in a year She will continue follow-up with GYN surgeon

## 2022-01-13 NOTE — Assessment & Plan Note (Signed)
She has slight worsening kidney function Recommend increase fluid hydration and aggressive risk factor modification with close follow-up with primary care doctor

## 2022-01-13 NOTE — Progress Notes (Signed)
[place] OFFICE PROGRESS NOTE  Patient Care Team: Physicians, [facility] as PCP - General (Family Medicine) Gery Pray, MD as Consulting Physician (Radiation Oncology)  ASSESSMENT & PLAN:  Endometrial cancer [facility]) She has no signs of cancer recurrence We will get her port removed I will see her in a year She will continue follow-up with GYN surgeon  CKD (chronic kidney disease), stage III She has slight worsening kidney function Recommend increase fluid hydration and aggressive risk factor modification with close follow-up with primary care doctor   Orders Placed This Encounter  Procedures   IR REMOVAL TUN ACCESS W/ PORT W/O FL MOD SED    Standing Status:   Future    Standing Expiration Date:   01/13/2023    Order Specific Question:   Reason for exam:    Answer:   no need port    Order Specific Question:   Preferred Imaging Location?    Answer:   [facility]    All questions were answered. The patient knows to call the clinic with any problems, questions or concerns. The total time spent in the appointment was 30 minutes encounter with patients including review of chart and various tests results, discussions about plan of care and coordination of care plan   Heath Lark, MD 01/13/2022 8:21 PM  INTERVAL HISTORY: Please see below for problem oriented charting. she returns for review of imaging results She denies new symptoms since I saw her  REVIEW OF SYSTEMS:   Constitutional: Denies fevers, chills or abnormal weight loss Eyes: Denies blurriness of vision Ears, nose, mouth, throat, and face: Denies mucositis or sore throat Respiratory: Denies cough, dyspnea or wheezes Cardiovascular: Denies palpitation, chest discomfort or lower extremity swelling Gastrointestinal:  Denies nausea, heartburn or change in bowel habits Skin: Denies abnormal skin rashes Lymphatics: Denies new lymphadenopathy or easy bruising Neurological:Denies numbness,  tingling or new weaknesses Behavioral/Psych: Mood is stable, no new changes  All other systems were reviewed with the patient and are negative.  I have reviewed the past medical history, past surgical history, social history and family history with the patient and they are unchanged from previous note.  ALLERGIES:  is allergic to other, amoxicillin, and latex.  MEDICATIONS:  Current Outpatient Medications  Medication Sig Dispense Refill   allopurinol (ZYLOPRIM) 100 MG tablet Take 100 mg by mouth daily.     B Complex Vitamins (B COMPLEX PO) Take 1 Dose by mouth daily. Liquid b complex     benazepril-hydrochlorthiazide (LOTENSIN HCT) 20-25 MG tablet Take 1 tablet by mouth daily.     cyanocobalamin 1000 MCG tablet Take by mouth.     diphenhydrAMINE (BENADRYL) 25 MG tablet Take 50 mg by mouth daily as needed for allergies.     escitalopram (LEXAPRO) 10 MG tablet Take 10 mg by mouth daily.     Green Tea, Camellia sinensis, (GREEN TEA EXTRACT PO) Take by mouth.     LINZESS 145 MCG CAPS capsule Take 145 mcg by mouth daily.     Magnesium Hydroxide 1200 MG CHEW Chew by mouth.     NATURAL PSYLLIUM SEED PO Take by mouth.     omeprazole (PRILOSEC OTC) 20 MG tablet Take by mouth.     Oxymetazoline HCl (VICKS SINEX 12 HOUR NA) Place 1 spray into the nose 2 (two) times daily as needed (congestion).     tiZANidine (ZANAFLEX) 4 MG tablet Take 4 mg by mouth at bedtime.     topiramate (TOPAMAX) 25  MG tablet Take 75 mg by mouth at bedtime.     No current facility-administered medications for this visit.    SUMMARY OF ONCOLOGIC HISTORY: Oncology History Overview Note  Endometrioid, MSI High, MLH1 promoter hypermethylation present   Endometrial cancer ([place])  05/18/2019 Initial Biopsy   EMB: Complex atypical hyperplasia, cannot rule out microscopic focus of FIGO grade 1 carcinoma   05/18/2019 Initial Diagnosis   Presented in February 2021 with postmenopausal bleeding.  She was started on Provera and  underwent endometrial biopsy showing complex atypical hyperplasia with a possible focus suspicious for grade 1 endometrial cancer.   05/21/2019 Imaging   Pelvic ultrasound at [facility]: Anteverted uterus measuring 8.2 cm in greatest dimension.  Endometrial lining measures 1.73 cm and is thickened.  Left ovary not visualized, right ovary normal in appearance.   06/30/2019 Surgery   Robotic-assisted laparoscopic total hysterectomy with bilateral salpingoophorectomy, bilateral pelvic LND  Frozen section showed endometrial cancer with more than 50% myometrial invasion.  Patient did not map to a sentinel lymph node bilaterally.   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  Tumor measures 3.5 cm and invades focally into the serosa.  No involvement of the cervix or bilateral adnexa.  1 of 20 lymph nodes positive for metastatic carcinoma, focus measures 1 cm without evidence of extracapsular extension.  FINAL MICROSCOPIC DIAGNOSIS: A. UTERUS, CERVIX, BILATERAL FALLOPIAN TUBES AND OVARIES, HYSTERECTOMY WITH SALPINGECTOMY: - Endometrioid adenocarcinoma, 3.5 cm, FIGO grade 1 - Carcinoma invades through the entire myometrium and focally into the serosal surface - Benign unremarkable cervix - Benign unremarkable bilateral fallopian tubes and left ovary - Benign serous inclusion cyst of right ovary - See oncology table B. LYMPH NODE, RIGHT PELVIC, BIOPSY: - Metastatic carcinoma to one of twelve lymph nodes (1/12) - Focus of metastatic carcinoma measures 1.0 cm, without evidence of extracapsular extension C. LYMPH NODE, LEFT PELVIC, BIOPSY: - Eight benign lymph nodes (0/8) ONCOLOGY TABLE: UTERUS, CARCINOMA OR CARCINOSARCOMA Procedure: Total hysterectomy and bilateral salpingo-oophorectomy Histologic type: Endometrioid adenocarcinoma Histologic Grade: FIGO grade 1 Myometrial invasion: Depth of invasion: 30 mm Myometrial thickness: 30 mm Uterine  Serosa Involvement: Present, focal Cervical stromal involvement: Not identified Extent of involvement of other organs: Not identified Lymphovascular invasion: Not identified Regional Lymph Nodes: Examined: 0 Sentinel 20 Non-sentinel 20 Total Lymph nodes with metastasis: 1 Isolated tumor cells (<0.2 mm): 0 Micrometastasis: (>0.2 mm and < 2.0 mm): 0 Macrometastasis: (>2.0 mm): 1 Extracapsular extension: Not identified Representative Tumor Block: A6 MMR / MSI testing: Will be ordered Pathologic Stage Classification (pTNM, AJCC 8th edition): pT3, pN1a Comments: None Mismatch Repair Protein (IHC) SUMMARY INTERPRETATION: ABNORMAL 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 The loss of expression may be secondary to promoter hyper-methylation, gene mutation or other genetic event. BRAF mutation testing and/or MLH1 methylation testing is indicated. The presence of a BRAF mutation and/or MLH1 hypermethylation is indicative of a sporadic-type tumor. The absence of either BRAF mutation and/or presence of normal methylation indicate the possible presence of a hereditary germline mutation (e.g.Lynch syndrome) and referral to genetic counseling is warranted. It is recommended that the loss of protein expression be correlated with molecular based MSI testing   06/30/2019 Cancer Staging   Staging form: Corpus Uteri - Carcinoma and Carcinosarcoma, AJCC 8th Edition - Clinical stage from 06/30/2019: FIGO Stage IIIC1 (cT3, cN1a, cM0) - Signed by Lafonda Mosses, MD on 07/09/2019   07/16/2019 Imaging   1.  No evidence of metastatic disease in the chest, abdomen or pelvis. 2. Status post hysterectomy. Small volume simple ascites. Small amount of ill-defined fluid in the pelvic sidewalls, compatible with recent surgery. 3. Small umbilical hernia containing fat and trace ascitic fluid. 4. Chronic findings include: Aortic Atherosclerosis (ICD10-I70.0). Small hiatal hernia. Mild sigmoid  diverticulosis   07/29/2019 Procedure   Ultrasound and fluoroscopically guided right internal jugular single lumen power port catheter insertion. Tip in the SVC/RA junction. Catheter ready for use.   08/03/2019 - 12/02/2019 Chemotherapy   The patient had carboplatin and taxol for chemotherapy treatment.     12/29/2019 Imaging   1. Enlargement of a left external iliac node, borderline sized. This could be reactive or represent early/isolated nodal metastasis. 2. Otherwise, no evidence of metastatic disease in the abdomen or pelvis. 3. Relative hyperattenuation surrounding the gallbladder is favored to be related to hepatic steatosis and sparing. Recommend attention on follow-up. 4. Degraded evaluation of the pelvis, secondary to beam hardening artifact from left hip arthroplasty. 5. Small hiatal hernia. 6. Decrease in trace pelvic fluid. 7. Aortic Atherosclerosis (ICD10-I70.0).   03/30/2020 Imaging   CT A/P: 1. No definite recurrent or metastatic disease in a patient with history of uterine/cervical cancer. Please note limited evaluation of the pelvis due to streak artifact originating from left femoral surgical hardware. 2. Interval development of a nonspecific prominent 0.8 cm retroperitoneal lymph node. Attention on follow-up. 3. Stable prominent bilateral [place] lymph nodes measuring up to 0.9 cm on the left. Attention on follow-up. 4. Small hiatal hernia, likely of the paraesophageal type. 5. Scattered colonic diverticulosis with no acute diverticulitis. 6.  Aortic Atherosclerosis (ICD10-I70.0).   06/13/2020 Imaging   CT A/P: No evidence of recurrent or metastatic disease.   Stable left retroperitoneal and left external iliac lymph node. No new or enlarging lymph nodes.   Hepatic steatosis.   Aortic atherosclerosis.   01/09/2022 Imaging   1. Stable examination demonstrating no findings to suggest locally recurrent or metastatic disease. 2. Aortic atherosclerosis. 3.  Colonic diverticulosis without evidence of acute diverticulitis at this time.  4. Additional incidental findings, as above.       PHYSICAL EXAMINATION: ECOG PERFORMANCE STATUS: 0 - Asymptomatic  Vitals:   01/12/22 1206  BP: (!) 117/57  Pulse: 71  Resp: 18  Temp: 98.7 F (37.1 C)  SpO2: 97%   Filed Weights   01/12/22 1206  Weight: 198 lb 9.6 oz (90.1 kg)    GENERAL:alert, no distress and comfortable NEURO: alert & oriented x 3 with fluent speech, no focal motor/sensory deficits  LABORATORY DATA:  I have reviewed the data as listed    Component Value Date/Time   NA 138 12/28/2021 1224   K 3.4 (L) 12/28/2021 1224   CL 104 12/28/2021 1224   CO2 27 12/28/2021 1224   GLUCOSE 109 (H) 12/28/2021 1224   BUN 25 (H) 12/28/2021 1224   CREATININE 2.00 (H) 12/28/2021 1224   CREATININE 1.62 (H) 12/30/2019 1300   CALCIUM 9.7 12/28/2021 1224   PROT 6.5 12/28/2021 1224   ALBUMIN 4.1 12/28/2021 1224   AST 11 (L) 12/28/2021 1224   AST 13 (L) 12/30/2019 1300   ALT 7 12/28/2021 1224   ALT 14 12/30/2019 1300   ALKPHOS 40 12/28/2021 1224   BILITOT 0.5 12/28/2021 1224   BILITOT 0.4 12/30/2019 1300   GFRNONAA 26 (L) 12/28/2021 1224   GFRNONAA 31 (L) 12/30/2019 1300   GFRAA 36 (L) 12/30/2019 1300    No results found  for: "SPEP", "UPEP"  Lab Results  Component Value Date   WBC 6.1 12/28/2021   NEUTROABS 4.1 12/28/2021   HGB 11.2 (L) 12/28/2021   HCT 33.2 (L) 12/28/2021   MCV 89.5 12/28/2021   PLT 279 12/28/2021      Chemistry      Component Value Date/Time   NA 138 12/28/2021 1224   K 3.4 (L) 12/28/2021 1224   CL 104 12/28/2021 1224   CO2 27 12/28/2021 1224   BUN 25 (H) 12/28/2021 1224   CREATININE 2.00 (H) 12/28/2021 1224   CREATININE 1.62 (H) 12/30/2019 1300      Component Value Date/Time   CALCIUM 9.7 12/28/2021 1224   ALKPHOS 40 12/28/2021 1224   AST 11 (L) 12/28/2021 1224   AST 13 (L) 12/30/2019 1300   ALT 7 12/28/2021 1224   ALT 14 12/30/2019 1300    BILITOT 0.5 12/28/2021 1224   BILITOT 0.4 12/30/2019 1300       RADIOGRAPHIC STUDIES: I have personally reviewed the radiological images as listed and agreed with the findings in the report. CT ABDOMEN PELVIS WO CONTRAST  Result Date: 01/06/2022 CLINICAL DATA:  76 year old female with history of uterine/cervical cancer. Endometrial cancer. Follow-up study. * Tracking Code: BO * EXAM: CT ABDOMEN AND PELVIS WITHOUT CONTRAST TECHNIQUE: Multidetector CT imaging of the abdomen and pelvis was performed following the standard protocol without IV contrast. RADIATION DOSE REDUCTION: This exam was performed according to the departmental dose-optimization program which includes automated exposure control, adjustment of the mA and/or kV according to patient size and/or use of iterative reconstruction technique. COMPARISON:  CT of the abdomen and pelvis 06/13/2020. FINDINGS: Lower chest: Atherosclerotic calcifications in the descending thoracic aorta. Calcifications of the mitral annulus. Small hiatal hernia. Hepatobiliary: No suspicious cystic or solid hepatic lesions are confidently identified on today's noncontrast CT examination. Unenhanced appearance of the gallbladder is normal. Pancreas: No definite pancreatic mass or peripancreatic fluid collections or inflammatory changes are noted on today's noncontrast CT examination. Spleen: Unremarkable. Adrenals/Urinary Tract: Unenhanced appearance of the kidneys and bilateral adrenal glands is normal. No hydroureteronephrosis. Urinary bladder is completely decompressed, but otherwise unremarkable in appearance. Stomach/Bowel: Unenhanced appearance of the stomach is normal. No pathologic dilatation of small bowel or colon. A few scattered colonic diverticuli are noted, without surrounding inflammatory changes to indicate an acute diverticulitis at this time. Normal appendix. Vascular/Lymphatic: Atherosclerosis in the abdominal and pelvic vasculature. No lymphadenopathy  noted in the abdomen or pelvis. Reproductive: Status post total abdominal hysterectomy and bilateral salpingo-oophorectomy. No unexpected soft tissue mass in the low anatomic pelvis to suggest locally recurrent disease. Other: No significant volume of ascites.  No pneumoperitoneum. Musculoskeletal: Status post left hip hemiarthroplasty. There are no aggressive appearing lytic or blastic lesions noted in the visualized portions of the skeleton. IMPRESSION: 1. Stable examination demonstrating no findings to suggest locally recurrent or metastatic disease. 2. Aortic atherosclerosis. 3. Colonic diverticulosis without evidence of acute diverticulitis at this time. 4. Additional incidental findings, as above. Electronically Signed   By: Vinnie Langton M.D.   On: 01/06/2022 10:03

## 2022-01-14 ENCOUNTER — Encounter: Payer: Self-pay | Admitting: Hematology and Oncology

## 2022-01-23 ENCOUNTER — Ambulatory Visit (HOSPITAL_COMMUNITY)
Admission: RE | Admit: 2022-01-23 | Discharge: 2022-01-23 | Disposition: A | Discharge status: Home / Self Care | Payer: Medicare Other | Source: Ambulatory Visit | Attending: Hematology and Oncology | Admitting: Hematology and Oncology

## 2022-01-23 DIAGNOSIS — Z452 Encounter for adjustment and management of vascular access device: Secondary | ICD-10-CM | POA: Insufficient documentation

## 2022-01-23 DIAGNOSIS — C541 Malignant neoplasm of endometrium: Secondary | ICD-10-CM | POA: Insufficient documentation

## 2022-01-23 HISTORY — PX: IR REMOVAL TUN ACCESS W/ PORT W/O FL MOD SED: IMG2290

## 2022-01-23 MED ORDER — LIDOCAINE HCL 1 % IJ SOLN: 10.0000 mL | Freq: Once | INTRAMUSCULAR | Status: AC

## 2022-01-23 MED ORDER — LIDOCAINE HCL 1 % IJ SOLN
INTRAMUSCULAR | Status: AC
  Administered 2022-01-23: 10 mL
  Filled 2022-01-23: qty 20

## 2022-02-06 ENCOUNTER — Telehealth: Payer: Self-pay

## 2022-02-06 NOTE — Telephone Encounter (Signed)
Returned her call. She ask that the office fax last office note and labs with Dr. Jeryl Columbia, PCP, faxed office note to [phone], received fax confirmation.  She ask that the office share last office note and labs with Dr. Olivia Mackie, faxed to [phone], received fax confirmation.

## 2022-02-13 DIAGNOSIS — N184 Chronic kidney disease, stage 4 (severe): Secondary | ICD-10-CM | POA: Diagnosis not present

## 2022-02-15 DIAGNOSIS — I129 Hypertensive chronic kidney disease with stage 1 through stage 4 chronic kidney disease, or unspecified chronic kidney disease: Secondary | ICD-10-CM | POA: Diagnosis not present

## 2022-02-15 DIAGNOSIS — T39395A Adverse effect of other nonsteroidal anti-inflammatory drugs [NSAID], initial encounter: Secondary | ICD-10-CM | POA: Diagnosis not present

## 2022-02-15 DIAGNOSIS — D631 Anemia in chronic kidney disease: Secondary | ICD-10-CM | POA: Diagnosis not present

## 2022-02-15 DIAGNOSIS — M898X9 Other specified disorders of bone, unspecified site: Secondary | ICD-10-CM | POA: Diagnosis not present

## 2022-02-15 DIAGNOSIS — N1832 Chronic kidney disease, stage 3b: Secondary | ICD-10-CM | POA: Diagnosis not present

## 2022-02-15 DIAGNOSIS — E559 Vitamin D deficiency, unspecified: Secondary | ICD-10-CM | POA: Diagnosis not present

## 2022-02-15 DIAGNOSIS — N183 Chronic kidney disease, stage 3 unspecified: Secondary | ICD-10-CM | POA: Diagnosis not present

## 2022-02-15 DIAGNOSIS — N1419 Nephropathy induced by other drugs, medicaments and biological substances: Secondary | ICD-10-CM | POA: Diagnosis not present

## 2022-03-22 DIAGNOSIS — J209 Acute bronchitis, unspecified: Secondary | ICD-10-CM | POA: Diagnosis not present

## 2022-03-22 DIAGNOSIS — H6993 Unspecified Eustachian tube disorder, bilateral: Secondary | ICD-10-CM | POA: Diagnosis not present

## 2022-03-22 DIAGNOSIS — R051 Acute cough: Secondary | ICD-10-CM | POA: Diagnosis not present

## 2022-03-22 DIAGNOSIS — R062 Wheezing: Secondary | ICD-10-CM | POA: Diagnosis not present

## 2022-04-23 ENCOUNTER — Telehealth: Payer: Self-pay | Admitting: *Deleted

## 2022-04-23 NOTE — Telephone Encounter (Signed)
Enid Derry from radiation called and scheduled the patient for a follow up appt with  Dr Berline Lopes on 3/28 at 2 pm. Appt scheduled and Enid Derry will contact the patient with the appt date/time.

## 2022-04-23 NOTE — Telephone Encounter (Signed)
Called patient to inform of fu with Dr.Tucker on 07-12-22- arrival time- 11:30 am, spoke with patient and she is aware of this appt.

## 2022-06-20 DIAGNOSIS — Z1231 Encounter for screening mammogram for malignant neoplasm of breast: Secondary | ICD-10-CM | POA: Diagnosis not present

## 2022-07-11 ENCOUNTER — Encounter: Payer: Self-pay | Admitting: Gynecologic Oncology

## 2022-07-12 ENCOUNTER — Other Ambulatory Visit: Payer: Self-pay

## 2022-07-12 ENCOUNTER — Encounter: Payer: Self-pay | Admitting: Gynecologic Oncology

## 2022-07-12 ENCOUNTER — Inpatient Hospital Stay: Payer: Medicare Other | Attending: Gynecologic Oncology | Admitting: Gynecologic Oncology

## 2022-07-12 VITALS — BP 115/54 | HR 70 | Temp 97.7°F | Resp 16 | Ht 63.0 in | Wt 187.4 lb

## 2022-07-12 DIAGNOSIS — Z90710 Acquired absence of both cervix and uterus: Secondary | ICD-10-CM | POA: Insufficient documentation

## 2022-07-12 DIAGNOSIS — Z90722 Acquired absence of ovaries, bilateral: Secondary | ICD-10-CM | POA: Diagnosis not present

## 2022-07-12 DIAGNOSIS — Z9221 Personal history of antineoplastic chemotherapy: Secondary | ICD-10-CM | POA: Diagnosis not present

## 2022-07-12 DIAGNOSIS — Z08 Encounter for follow-up examination after completed treatment for malignant neoplasm: Secondary | ICD-10-CM | POA: Diagnosis not present

## 2022-07-12 DIAGNOSIS — Z8542 Personal history of malignant neoplasm of other parts of uterus: Secondary | ICD-10-CM | POA: Diagnosis not present

## 2022-07-12 DIAGNOSIS — C541 Malignant neoplasm of endometrium: Secondary | ICD-10-CM

## 2022-07-12 NOTE — Patient Instructions (Signed)
It was good to see you today.  I do not see or feel any evidence of cancer recurrence on your exam.  I will see you for follow-up in 12 months. Please call the clinic after your visit with Dr. Sondra Come or in the late part of the year to schedule a visit to see me in February or March 2025.  As always, if you develop any new and concerning symptoms before your next visit, please call to see me sooner.

## 2022-07-12 NOTE — Progress Notes (Signed)
Gynecologic Oncology Return Clinic Visit  07/12/22  Reason for Visit: Surveillance visit in the setting of advanced stage uterine cancer   Treatment History: Oncology History Overview Note  Endometrioid, MSI High, MLH1 promoter hypermethylation present   Endometrial cancer ([place])  05/18/2019 Initial Biopsy   EMB: Complex atypical hyperplasia, cannot rule out microscopic focus of FIGO grade 1 carcinoma   05/18/2019 Initial Diagnosis   Presented in February 2021 with postmenopausal bleeding.  She was started on Provera and underwent endometrial biopsy showing complex atypical hyperplasia with a possible focus suspicious for grade 1 endometrial cancer.   05/21/2019 Imaging   Pelvic ultrasound at [facility]: Anteverted uterus measuring 8.2 cm in greatest dimension.  Endometrial lining measures 1.73 cm and is thickened.  Left ovary not visualized, right ovary normal in appearance.   06/30/2019 Surgery   Robotic-assisted laparoscopic total hysterectomy with bilateral salpingoophorectomy, bilateral pelvic LND  Frozen section showed endometrial cancer with more than 50% myometrial invasion.  Patient did not map to a sentinel lymph node bilaterally.   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  Tumor measures 3.5 cm and invades focally into the serosa.  No involvement of the cervix or bilateral adnexa.  1 of 20 lymph nodes positive for metastatic carcinoma, focus measures 1 cm without evidence of extracapsular extension.  FINAL MICROSCOPIC DIAGNOSIS: A. UTERUS, CERVIX, BILATERAL FALLOPIAN TUBES AND OVARIES, HYSTERECTOMY WITH SALPINGECTOMY: - Endometrioid adenocarcinoma, 3.5 cm, FIGO grade 1 - Carcinoma invades through the entire myometrium and focally into the serosal surface - Benign unremarkable cervix - Benign unremarkable bilateral fallopian tubes and left ovary - Benign serous inclusion cyst of right ovary - See oncology table B. LYMPH NODE,  RIGHT PELVIC, BIOPSY: - Metastatic carcinoma to one of twelve lymph nodes (1/12) - Focus of metastatic carcinoma measures 1.0 cm, without evidence of extracapsular extension C. LYMPH NODE, LEFT PELVIC, BIOPSY: - Eight benign lymph nodes (0/8) ONCOLOGY TABLE: UTERUS, CARCINOMA OR CARCINOSARCOMA Procedure: Total hysterectomy and bilateral salpingo-oophorectomy Histologic type: Endometrioid adenocarcinoma Histologic Grade: FIGO grade 1 Myometrial invasion: Depth of invasion: 30 mm Myometrial thickness: 30 mm Uterine Serosa Involvement: Present, focal Cervical stromal involvement: Not identified Extent of involvement of other organs: Not identified Lymphovascular invasion: Not identified Regional Lymph Nodes: Examined: 0 Sentinel 20 Non-sentinel 20 Total Lymph nodes with metastasis: 1 Isolated tumor cells (<0.2 mm): 0 Micrometastasis: (>0.2 mm and < 2.0 mm): 0 Macrometastasis: (>2.0 mm): 1 Extracapsular extension: Not identified Representative Tumor Block: A6 MMR / MSI testing: Will be ordered Pathologic Stage Classification (pTNM, AJCC 8th edition): pT3, pN1a Comments: None Mismatch Repair Protein (IHC) SUMMARY INTERPRETATION: ABNORMAL 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 The loss of expression may be secondary to promoter hyper-methylation, gene mutation or other genetic event. BRAF mutation testing and/or MLH1 methylation testing is indicated. The presence of a BRAF mutation and/or MLH1 hypermethylation is indicative of a sporadic-type tumor. The absence of either BRAF mutation and/or presence of normal methylation indicate the possible presence of a hereditary germline mutation (e.g.Lynch syndrome) and referral to genetic counseling is warranted. It is recommended that the loss of protein expression be correlated with molecular based MSI testing   06/30/2019 Cancer Staging   Staging form: Corpus Uteri - Carcinoma and Carcinosarcoma, AJCC 8th Edition -  Clinical stage from 06/30/2019: FIGO Stage IIIC1 (cT3, cN1a, cM0) - Signed by Lafonda Mosses, MD on 07/09/2019   07/16/2019 Imaging   1. No evidence  of metastatic disease in the chest, abdomen or pelvis. 2. Status post hysterectomy. Small volume simple ascites. Small amount of ill-defined fluid in the pelvic sidewalls, compatible with recent surgery. 3. Small umbilical hernia containing fat and trace ascitic fluid. 4. Chronic findings include: Aortic Atherosclerosis (ICD10-I70.0). Small hiatal hernia. Mild sigmoid diverticulosis   07/29/2019 Procedure   Ultrasound and fluoroscopically guided right internal jugular single lumen power port catheter insertion. Tip in the SVC/RA junction. Catheter ready for use.   08/03/2019 - 12/02/2019 Chemotherapy   The patient had carboplatin and taxol for chemotherapy treatment.     12/29/2019 Imaging   1. Enlargement of a left external iliac node, borderline sized. This could be reactive or represent early/isolated nodal metastasis. 2. Otherwise, no evidence of metastatic disease in the abdomen or pelvis. 3. Relative hyperattenuation surrounding the gallbladder is favored to be related to hepatic steatosis and sparing. Recommend attention on follow-up. 4. Degraded evaluation of the pelvis, secondary to beam hardening artifact from left hip arthroplasty. 5. Small hiatal hernia. 6. Decrease in trace pelvic fluid. 7. Aortic Atherosclerosis (ICD10-I70.0).   03/30/2020 Imaging   CT A/P: 1. No definite recurrent or metastatic disease in a patient with history of uterine/cervical cancer. Please note limited evaluation of the pelvis due to streak artifact originating from left femoral surgical hardware. 2. Interval development of a nonspecific prominent 0.8 cm retroperitoneal lymph node. Attention on follow-up. 3. Stable prominent bilateral [place] lymph nodes measuring up to 0.9 cm on the left. Attention on follow-up. 4. Small hiatal hernia, likely of the  paraesophageal type. 5. Scattered colonic diverticulosis with no acute diverticulitis. 6.  Aortic Atherosclerosis (ICD10-I70.0).   06/13/2020 Imaging   CT A/P: No evidence of recurrent or metastatic disease.   Stable left retroperitoneal and left external iliac lymph node. No new or enlarging lymph nodes.   Hepatic steatosis.   Aortic atherosclerosis.   01/09/2022 Imaging   1. Stable examination demonstrating no findings to suggest locally recurrent or metastatic disease. 2. Aortic atherosclerosis. 3. Colonic diverticulosis without evidence of acute diverticulitis at this time.  4. Additional incidental findings, as above.     01/24/2022 Procedure   Removal of implanted Port-A-Cath utilizing sharp and blunt dissection. The procedure was uncomplicated.     Interval History: Doing well.  Denies any vaginal bleeding or discharge.  Denies pelvic or abdominal pain.  Reports baseline bowel bladder function.  Past Medical/Surgical History: Past Medical History:  Diagnosis Date   Cataract    Complex endometrial hyperplasia with atypia    Dyspnea    endometrial ca 06/2019   Family history of colon cancer    Family history of lung cancer    Family history of melanoma    Family history of prostate cancer    GERD (gastroesophageal reflux disease)    Heart murmur    as a child   History of radiation therapy    endometrium, vaginal brachytherapy Dr. Gery Pray 09/07/19-10/14/19   Hypertension    Obesity    Pneumonia 2018   Post-menopausal bleeding 05/18/2019    Past Surgical History:  Procedure Laterality Date   BIOPSY ENDOMETRIAL     CATARACT EXTRACTION  03/2019   ESOPHAGOGASTRODUODENOSCOPY     IR IMAGING GUIDED PORT INSERTION  07/29/2019   IR REMOVAL TUN ACCESS W/ PORT W/O FL MOD SED  01/23/2022   REPLACEMENT TOTAL KNEE Right 06/19/2021   ROBOTIC ASSISTED TOTAL HYSTERECTOMY WITH BILATERAL SALPINGO OOPHERECTOMY Bilateral 06/30/2019   Procedure: XI ROBOTIC ASSISTED  TOTAL HYSTERECTOMY  WITH BILATERAL SALPINGO OOPHORECTOMY, BILATERAL LYMPH NODE DISSECTION;  Surgeon: Lafonda Mosses, MD;  Location: [facility];  Service: Gynecology;  Laterality: Bilateral;   SENTINEL NODE BIOPSY N/A 06/30/2019   Procedure: SENTINEL LYMPH NODE BIOPSY;  Surgeon: Lafonda Mosses, MD;  Location: [facility];  Service: Gynecology;  Laterality: N/A;   TONSILLECTOMY     TOTAL HIP ARTHROPLASTY Left    WISDOM TOOTH EXTRACTION      Family History  Problem Relation Age of Onset   Colon cancer Mother 25       treated with surgery only   Lung cancer Mother        dx in her mid-41s   CVA Father    Melanoma Sister        dx <60   Cancer Maternal Aunt        unknown type, dx >50   Melanoma Nephew        dx in his 48s   Endometrial cancer Neg Hx    Ovarian cancer Neg Hx    Breast cancer Neg Hx     Social History   Socioeconomic History   Marital status: Divorced    Spouse name: Not on file   Number of children: Not on file   Years of education: Not on file   Highest education level: Not on file  Occupational History   Not on file  Tobacco Use   Smoking status: Never   Smokeless tobacco: Never  Vaping Use   Vaping Use: Never used  Substance and Sexual Activity   Alcohol use: No   Drug use: Never   Sexual activity: Not Currently  Other Topics Concern   Not on file  Social History Narrative   Not on file   Social Determinants of Health   Financial Resource Strain: Not on file  Food Insecurity: Not on file  Transportation Needs: Not on file  Physical Activity: Not on file  Stress: Not on file  Social Connections: Not on file    Current Medications:  Current Outpatient Medications:    albuterol (VENTOLIN HFA) 108 (90 Base) MCG/ACT inhaler, SMARTSIG:1 Inhalation Via Inhaler Every 6 Hours PRN, Disp: , Rfl:    allopurinol (ZYLOPRIM) 100 MG tablet, Take 100 mg by mouth daily., Disp: , Rfl:    B Complex Vitamins (B COMPLEX PO), Take 1 Dose by mouth daily. Liquid  b complex, Disp: , Rfl:    benazepril-hydrochlorthiazide (LOTENSIN HCT) 20-25 MG tablet, Take 1 tablet by mouth daily., Disp: , Rfl:    cyanocobalamin 1000 MCG tablet, Take by mouth., Disp: , Rfl:    diphenhydrAMINE (BENADRYL) 25 MG tablet, Take 50 mg by mouth daily as needed for allergies., Disp: , Rfl:    escitalopram (LEXAPRO) 10 MG tablet, Take 10 mg by mouth daily., Disp: , Rfl:    Green Tea, Camellia sinensis, (GREEN TEA EXTRACT PO), Take by mouth., Disp: , Rfl:    LINZESS 145 MCG CAPS capsule, Take 145 mcg by mouth daily., Disp: , Rfl:    Magnesium Hydroxide 1200 MG CHEW, Chew by mouth., Disp: , Rfl:    NATURAL PSYLLIUM SEED PO, Take by mouth., Disp: , Rfl:    omeprazole (PRILOSEC OTC) 20 MG tablet, Take by mouth., Disp: , Rfl:    Oxymetazoline HCl (VICKS SINEX 12 HOUR NA), Place 1 spray into the nose 2 (two) times daily as needed (congestion)., Disp: , Rfl:    tiZANidine (ZANAFLEX) 4 MG tablet, Take 4 mg by mouth at bedtime.,  Disp: , Rfl:    topiramate (TOPAMAX) 25 MG tablet, Take 75 mg by mouth at bedtime., Disp: , Rfl:    brimonidine (ALPHAGAN) 0.2 % ophthalmic solution, APPLY 1 DROP LEFT EYE TWICE A DAY START 48 HOURS PRIOR TO SURGERY, Disp: , Rfl:   Review of Systems: Denies appetite changes, fevers, chills, fatigue, unexplained weight changes. Denies hearing loss, neck lumps or masses, mouth sores, ringing in ears or voice changes. Denies cough or wheezing.  Denies shortness of breath. Denies chest pain or palpitations. Denies leg swelling. Denies abdominal distention, pain, blood in stools, constipation, diarrhea, nausea, vomiting, or early satiety. Denies pain with intercourse, dysuria, frequency, hematuria or incontinence. Denies hot flashes, pelvic pain, vaginal bleeding or vaginal discharge.   Denies joint pain, back pain or muscle pain/cramps. Denies itching, rash, or wounds. Denies dizziness, headaches, numbness or seizures. Denies swollen lymph nodes or glands, denies  easy bruising or bleeding. Denies anxiety, depression, confusion, or decreased concentration.  Physical Exam: BP (!) 115/54 (BP Location: Right Arm, Patient Position: Sitting)   Pulse 70   Temp 97.7 F (36.5 C) (Oral)   Resp 16   Ht 5\' 3"  (1.6 m)   Wt 187 lb 6.4 oz (85 kg)   SpO2 100%   BMI 33.20 kg/m  General: Alert, oriented, no acute distress. HEENT: Normocephalic, atraumatic, sclera anicteric. Chest: Clear to auscultation bilaterally.  No wheezes or rhonchi. Cardiovascular: Regular rate and rhythm, no murmurs. Abdomen: Obese, soft, nontender.  Normoactive bowel sounds.  No masses or hepatosplenomegaly appreciated.  Well-healed incisions. Extremities: Grossly normal range of motion.  Warm, well perfused.  No edema bilaterally. Skin: No rashes or lesions noted. Lymphatics: No cervical, supraclavicular, or inguinal adenopathy. GU: Normal appearing external genitalia without erythema, excoriation, or lesions.  Speculum exam reveals moderately atrophic vaginal mucosa, radiation changes noted.  No bleeding, discharge, masses appreciated.  Bimanual exam reveals cuff intact, no masses or nodularity.  Rectovaginal exam confirms these findings.  Laboratory & Radiologic Studies: None new  Assessment & Plan: Heidi Avila is a 77 y.o. woman with a history of Stage IIIC1 grade 1 endometrioid endometrial adenocarcinoma who completed adjuvant therapy with carboplatin and paclitaxel with vaginal brachytherapy approximately August 2021.   Patient is doing very well and is NED on exam today.     Per NCCN surveillance recommendations, we will continue with visits every 6 months.  She is scheduled to see Dr. Sondra Come in August.  I will plan to see her in February or March 2025.  We discussed signs and symptoms that should prompt a phone call before her next scheduled visit.  20 minutes of total time was spent for this patient encounter, including preparation, face-to-face counseling with the  patient and coordination of care, and documentation of the encounter.  Jeral Pinch, MD  Division of Gynecologic Oncology  Department of Obstetrics and Gynecology  [facility]

## 2022-08-14 DIAGNOSIS — Z08 Encounter for follow-up examination after completed treatment for malignant neoplasm: Secondary | ICD-10-CM | POA: Diagnosis not present

## 2022-08-14 DIAGNOSIS — D225 Melanocytic nevi of trunk: Secondary | ICD-10-CM | POA: Diagnosis not present

## 2022-08-14 DIAGNOSIS — L538 Other specified erythematous conditions: Secondary | ICD-10-CM | POA: Diagnosis not present

## 2022-08-14 DIAGNOSIS — L820 Inflamed seborrheic keratosis: Secondary | ICD-10-CM | POA: Diagnosis not present

## 2022-08-14 DIAGNOSIS — L821 Other seborrheic keratosis: Secondary | ICD-10-CM | POA: Diagnosis not present

## 2022-08-14 DIAGNOSIS — Z86006 Personal history of melanoma in-situ: Secondary | ICD-10-CM | POA: Diagnosis not present

## 2022-08-14 DIAGNOSIS — N184 Chronic kidney disease, stage 4 (severe): Secondary | ICD-10-CM | POA: Diagnosis not present

## 2022-08-14 DIAGNOSIS — L304 Erythema intertrigo: Secondary | ICD-10-CM | POA: Diagnosis not present

## 2022-08-14 DIAGNOSIS — S20369A Insect bite (nonvenomous) of unspecified front wall of thorax, initial encounter: Secondary | ICD-10-CM | POA: Diagnosis not present

## 2022-08-14 DIAGNOSIS — L814 Other melanin hyperpigmentation: Secondary | ICD-10-CM | POA: Diagnosis not present

## 2022-08-16 DIAGNOSIS — I129 Hypertensive chronic kidney disease with stage 1 through stage 4 chronic kidney disease, or unspecified chronic kidney disease: Secondary | ICD-10-CM | POA: Diagnosis not present

## 2022-08-16 DIAGNOSIS — N184 Chronic kidney disease, stage 4 (severe): Secondary | ICD-10-CM | POA: Diagnosis not present

## 2022-08-16 DIAGNOSIS — M898X9 Other specified disorders of bone, unspecified site: Secondary | ICD-10-CM | POA: Diagnosis not present

## 2022-08-16 DIAGNOSIS — D631 Anemia in chronic kidney disease: Secondary | ICD-10-CM | POA: Diagnosis not present

## 2022-08-16 DIAGNOSIS — E559 Vitamin D deficiency, unspecified: Secondary | ICD-10-CM | POA: Diagnosis not present

## 2022-10-23 DIAGNOSIS — G4700 Insomnia, unspecified: Secondary | ICD-10-CM | POA: Diagnosis not present

## 2022-10-23 DIAGNOSIS — G629 Polyneuropathy, unspecified: Secondary | ICD-10-CM | POA: Diagnosis not present

## 2022-10-23 DIAGNOSIS — F32A Depression, unspecified: Secondary | ICD-10-CM | POA: Diagnosis not present

## 2022-12-07 ENCOUNTER — Telehealth: Payer: Self-pay | Admitting: *Deleted

## 2022-12-07 NOTE — Telephone Encounter (Signed)
RETURNED PATIENT'S PHONE CALL, SPOKE WITH PATIENT. ?

## 2022-12-10 ENCOUNTER — Ambulatory Visit: Payer: Medicare Other | Admitting: Radiation Oncology

## 2022-12-19 ENCOUNTER — Telehealth: Payer: Self-pay | Admitting: Hematology and Oncology

## 2022-12-19 NOTE — Telephone Encounter (Signed)
 Left patient a message in regards to scheduled appointment times/dates; left callback number if needed for rescheduling

## 2022-12-25 ENCOUNTER — Encounter: Payer: Self-pay | Admitting: Nurse Practitioner

## 2022-12-31 ENCOUNTER — Encounter: Payer: Self-pay | Admitting: Radiation Oncology

## 2022-12-31 ENCOUNTER — Ambulatory Visit
Admission: RE | Admit: 2022-12-31 | Discharge: 2022-12-31 | Disposition: A | Discharge status: Home / Self Care | Payer: Medicare Other | Source: Ambulatory Visit | Attending: Radiation Oncology | Admitting: Radiation Oncology

## 2022-12-31 VITALS — BP 121/65 | HR 69 | Temp 97.8°F | Resp 18 | Ht 63.0 in | Wt 190.5 lb

## 2022-12-31 DIAGNOSIS — C541 Malignant neoplasm of endometrium: Secondary | ICD-10-CM | POA: Diagnosis not present

## 2022-12-31 DIAGNOSIS — Z8542 Personal history of malignant neoplasm of other parts of uterus: Secondary | ICD-10-CM | POA: Diagnosis not present

## 2022-12-31 DIAGNOSIS — Z79899 Other long term (current) drug therapy: Secondary | ICD-10-CM | POA: Insufficient documentation

## 2022-12-31 DIAGNOSIS — Z923 Personal history of irradiation: Secondary | ICD-10-CM | POA: Insufficient documentation

## 2022-12-31 NOTE — Progress Notes (Signed)
Radiation Oncology         [phone] ________________________________  Name: Heidi Avila MRN: [id number]  Date: 12/31/2022  DOB: 04/05/1946  Follow-Up Visit Note  CC: Phys[facility], [facility], MD    ICD-10-CM   1. 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 She was last seen here for follow-up on 11/27/21.   Since her last visit, the patient followed up with Dr. Bertis Ruddy in September of 2023. She denied any symptoms or concerns at that time. She  also presented for a restaging CT AP without contrast on 01/04/22 which showed no evidence of disease recurrence or metastatic disease in the abdomen or pelvis. In light of her negative imaging and negative symptom review, she was able to undergo port removal on 01/23/22.  She also denied any symptoms concerning for disease recurrence and was NED on examination during her most recent visit with Dr. Pricilla Holm on 07/12/22.       No other significant oncologic interval history since the patient was last seen for follow-up.   On evaluation today the patient denies any pelvic pain vaginal bleeding or discharge.  She denies any problems with abdominal bloating.  She also denies any hematuria or rectal bleeding.                        Allergies:  is allergic to other, amoxicillin, and latex.  Meds: Current Outpatient Medications  Medication Sig Dispense Refill   albuterol (VENTOLIN HFA) 108 (90 Base) MCG/ACT inhaler SMARTSIG:1 Inhalation Via Inhaler Every 6 Hours PRN     allopurinol (ZYLOPRIM) 100 MG tablet Take 100 mg by mouth daily.     B  Complex Vitamins (B COMPLEX PO) Take 1 Dose by mouth daily. Liquid b complex     benazepril-hydrochlorthiazide (LOTENSIN HCT) 20-25 MG tablet Take 1 tablet by mouth daily.     brimonidine (ALPHAGAN) 0.2 % ophthalmic solution APPLY 1 DROP LEFT EYE TWICE A DAY START 48 HOURS PRIOR TO SURGERY     cyanocobalamin 1000 MCG tablet Take by mouth.     diphenhydrAMINE (BENADRYL) 25 MG tablet Take 50 mg by mouth daily as needed for allergies.     escitalopram (LEXAPRO) 10 MG tablet Take 10 mg by mouth daily.     Green Tea, Camellia sinensis, (GREEN TEA EXTRACT PO) Take by mouth.     LINZESS 145 MCG CAPS capsule Take 145 mcg by mouth daily.     Magnesium Hydroxide 1200 MG CHEW Chew by mouth.     NATURAL PSYLLIUM SEED PO Take by mouth.     omeprazole (PRILOSEC OTC) 20 MG tablet Take by mouth.     Oxymetazoline HCl (VICKS SINEX 12 HOUR NA) Place 1 spray into the nose 2 (two) times daily as needed (congestion).     tiZANidine (ZANAFLEX) 4 MG tablet Take 4 mg by mouth at bedtime.     topiramate (TOPAMAX) 25 MG tablet Take 75 mg by mouth at bedtime.     No current facility-administered medications for this encounter.    Physical Findings: The  patient is in no acute distress. Patient is alert and oriented.  height is 5\' 3"  (1.6 m) and weight is 190 lb 8 oz (86.4 kg). Her temporal temperature is 97.8 F (36.6 C). Her blood pressure is 121/65 and her pulse is 69. Her respiration is 18 and oxygen saturation is 97%. .  No significant changes. Lungs are clear to auscultation bilaterally. Heart has regular rate and rhythm. No palpable cervical, supraclavicular, or axillary adenopathy. Abdomen soft, non-tender, normal bowel sounds.  On pelvic examination the external genitalia were unremarkable. A speculum exam was performed. There are no mucosal lesions noted in the vaginal vault.  Some mild radiation changes noted at the cuff. On bimanual and rectovaginal examination there were no pelvic masses appreciated.  Vaginal  cuff intact.   Lab Findings: Lab Results  Component Value Date   WBC 6.1 12/28/2021   HGB 11.2 (L) 12/28/2021   HCT 33.2 (L) 12/28/2021   MCV 89.5 12/28/2021   PLT 279 12/28/2021    Radiographic Findings: No results found.  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  Plan: She will follow-up with Dr. Bertis Ruddy in the next few weeks with blood work.  She will then follow-up with Dr. Pricilla Holm in 6 months.  Routine follow-up in radiation oncology in 1 year.   23 minutes of total time was spent for this patient encounter, including preparation, face-to-face counseling with the patient and coordination of care, physical exam, and documentation of the encounter. ____________________________________  Billie Lade, PhD, MD  This document serves as a record of services personally performed by Antony Blackbird, MD. It was created on his behalf by Neena Rhymes, a trained medical scribe. The creation of this record is based on the scribe's personal observations and the provider's statements to them. This document has been checked and approved by the attending provider.

## 2022-12-31 NOTE — Progress Notes (Signed)
[facility] is here today for follow up post radiation to the pelvic.  They completed their radiation on: 10/14/19   Does the patient complain of any of the following:  Pain: No Abdominal bloating:  No Diarrhea/Constipation: No Nausea/Vomiting: No Vaginal Discharge: No Blood in Urine or Stool: no Urinary Issues (dysuria/incomplete emptying/ incontinence/ increased frequency/urgency): No Does patient report using vaginal dilator 2-3 times a week and/or sexually active 2-3 weeks: No Post radiation skin changes: No   Additional comments if applicable:   BP 121/65 (BP Location: Left Arm, Patient Position: Sitting)   Pulse 69   Temp 97.8 F (36.6 C) (Temporal)   Resp 18   Ht 5\' 3"  (1.6 m)   Wt 190 lb 8 oz (86.4 kg)   SpO2 97%   BMI 33.75 kg/m

## 2023-01-09 DIAGNOSIS — Z23 Encounter for immunization: Secondary | ICD-10-CM | POA: Diagnosis not present

## 2023-01-15 ENCOUNTER — Ambulatory Visit: Payer: Medicare Other | Admitting: Hematology and Oncology

## 2023-01-15 ENCOUNTER — Other Ambulatory Visit: Payer: Medicare Other

## 2023-01-30 DIAGNOSIS — Z23 Encounter for immunization: Secondary | ICD-10-CM | POA: Diagnosis not present

## 2023-02-08 ENCOUNTER — Other Ambulatory Visit: Payer: Self-pay

## 2023-02-08 ENCOUNTER — Encounter: Payer: Self-pay | Admitting: Hematology and Oncology

## 2023-02-08 ENCOUNTER — Inpatient Hospital Stay: Payer: Medicare Other | Attending: Hematology and Oncology

## 2023-02-08 ENCOUNTER — Inpatient Hospital Stay (HOSPITAL_BASED_OUTPATIENT_CLINIC_OR_DEPARTMENT_OTHER): Payer: Medicare Other | Admitting: Hematology and Oncology

## 2023-02-08 VITALS — BP 140/73 | HR 60 | Temp 98.0°F | Resp 17 | Wt 189.9 lb

## 2023-02-08 DIAGNOSIS — C541 Malignant neoplasm of endometrium: Secondary | ICD-10-CM | POA: Diagnosis not present

## 2023-02-08 DIAGNOSIS — Z08 Encounter for follow-up examination after completed treatment for malignant neoplasm: Secondary | ICD-10-CM | POA: Insufficient documentation

## 2023-02-08 DIAGNOSIS — Z8542 Personal history of malignant neoplasm of other parts of uterus: Secondary | ICD-10-CM | POA: Insufficient documentation

## 2023-02-08 DIAGNOSIS — K5909 Other constipation: Secondary | ICD-10-CM | POA: Diagnosis not present

## 2023-02-08 LAB — COMPREHENSIVE METABOLIC PANEL
ALT: 7 U/L (ref 0–44)
AST: 11 U/L — ABNORMAL LOW (ref 15–41)
Albumin: 4.3 g/dL (ref 3.5–5.0)
Alkaline Phosphatase: 41 U/L (ref 38–126)
Anion gap: 7 (ref 5–15)
BUN: 27 mg/dL — ABNORMAL HIGH (ref 8–23)
CO2: 27 mmol/L (ref 22–32)
Calcium: 10.2 mg/dL (ref 8.9–10.3)
Chloride: 105 mmol/L (ref 98–111)
Creatinine, Ser: 1.94 mg/dL — ABNORMAL HIGH (ref 0.44–1.00)
GFR, Estimated: 26 mL/min — ABNORMAL LOW (ref >60)
Glucose, Bld: 96 mg/dL (ref 70–99)
Potassium: 4 mmol/L (ref 3.5–5.1)
Sodium: 139 mmol/L (ref 135–145)
Total Bilirubin: 0.6 mg/dL (ref 0.3–1.2)
Total Protein: 7.1 g/dL (ref 6.5–8.1)

## 2023-02-08 LAB — CBC WITH DIFFERENTIAL/PLATELET
Abs Immature Granulocytes: 0.02 10*3/uL (ref 0.00–0.07)
Basophils Absolute: 0.1 10*3/uL (ref 0.0–0.1)
Basophils Relative: 1 %
Eosinophils Absolute: 0.2 10*3/uL (ref 0.0–0.5)
Eosinophils Relative: 4 %
HCT: 36.9 % (ref 36.0–46.0)
Hemoglobin: 12.1 g/dL (ref 12.0–15.0)
Immature Granulocytes: 0 %
Lymphocytes Relative: 17 %
Lymphs Abs: 1.1 10*3/uL (ref 0.7–4.0)
MCH: 30 pg (ref 26.0–34.0)
MCHC: 32.8 g/dL (ref 30.0–36.0)
MCV: 91.3 fL (ref 80.0–100.0)
Monocytes Absolute: 0.6 10*3/uL (ref 0.1–1.0)
Monocytes Relative: 10 %
Neutro Abs: 4.4 10*3/uL (ref 1.7–7.7)
Neutrophils Relative %: 68 %
Platelets: 263 10*3/uL (ref 150–400)
RBC: 4.04 MIL/uL (ref 3.87–5.11)
RDW: 13.3 % (ref 11.5–15.5)
WBC: 6.4 10*3/uL (ref 4.0–10.5)
nRBC: 0 % (ref 0.0–0.2)

## 2023-02-08 NOTE — Assessment & Plan Note (Signed)
She has no signs of cancer recurrence I will see her in a year She will continue follow-up with GYN surgeon

## 2023-02-08 NOTE — Assessment & Plan Note (Signed)
She has chronic constipation, unchanged compared to baseline We discussed importance of laxatives I educated the patient signs and symptoms to watch out for possible disease recurrence

## 2023-02-08 NOTE — Progress Notes (Signed)
[facility] OFFICE PROGRESS NOTE  Patient Care Team: Physicians, [facility] as PCP - General (Family Medicine) Antony Blackbird, MD as Consulting Physician (Radiation Oncology)  ASSESSMENT & PLAN:  Endometrial cancer [facility]) She has no signs of cancer recurrence I will see her in a year She will continue follow-up with GYN surgeon  Other constipation She has chronic constipation, unchanged compared to baseline We discussed importance of laxatives I educated the patient signs and symptoms to watch out for possible disease recurrence  No orders of the defined types were placed in this encounter.   All questions were answered. The patient knows to call the clinic with any problems, questions or concerns. The total time spent in the appointment was 20 minutes encounter with patients including review of chart and various tests results, discussions about plan of care and coordination of care plan   Artis Delay, MD 02/08/2023 1:15 PM  INTERVAL HISTORY: Please see below for problem oriented charting. she returns for surveillance follow-up for history of endometrial cancer She is doing well She had chronic constipation, unchanged Denies abdominal bloating, nausea, loss of appetite or vaginal bleeding  REVIEW OF SYSTEMS:   Constitutional: Denies fevers, chills or abnormal weight loss Eyes: Denies blurriness of vision Ears, nose, mouth, throat, and face: Denies mucositis or sore throat Respiratory: Denies cough, dyspnea or wheezes Cardiovascular: Denies palpitation, chest discomfort or lower extremity swelling Gastrointestinal:  Denies nausea, heartburn or change in bowel habits Skin: Denies abnormal skin rashes Lymphatics: Denies new lymphadenopathy or easy bruising Neurological:Denies numbness, tingling or new weaknesses Behavioral/Psych: Mood is stable, no new changes  All other systems were reviewed with the patient and are negative.  I have reviewed the past  medical history, past surgical history, social history and family history with the patient and they are unchanged from previous note.  ALLERGIES:  is allergic to other, amoxicillin, and latex.  MEDICATIONS:  Current Outpatient Medications  Medication Sig Dispense Refill   albuterol (VENTOLIN HFA) 108 (90 Base) MCG/ACT inhaler SMARTSIG:1 Inhalation Via Inhaler Every 6 Hours PRN     allopurinol (ZYLOPRIM) 100 MG tablet Take 100 mg by mouth daily.     B Complex Vitamins (B COMPLEX PO) Take 1 Dose by mouth daily. Liquid b complex     benazepril-hydrochlorthiazide (LOTENSIN HCT) 20-25 MG tablet Take 1 tablet by mouth daily.     brimonidine (ALPHAGAN) 0.2 % ophthalmic solution APPLY 1 DROP LEFT EYE TWICE A DAY START 48 HOURS PRIOR TO SURGERY     cyanocobalamin 1000 MCG tablet Take by mouth.     diphenhydrAMINE (BENADRYL) 25 MG tablet Take 50 mg by mouth daily as needed for allergies.     escitalopram (LEXAPRO) 10 MG tablet Take 10 mg by mouth daily.     Green Tea, Camellia sinensis, (GREEN TEA EXTRACT PO) Take by mouth.     LINZESS 145 MCG CAPS capsule Take 145 mcg by mouth daily.     Magnesium Hydroxide 1200 MG CHEW Chew by mouth.     NATURAL PSYLLIUM SEED PO Take by mouth.     omeprazole (PRILOSEC OTC) 20 MG tablet Take by mouth.     Oxymetazoline HCl (VICKS SINEX 12 HOUR NA) Place 1 spray into the nose 2 (two) times daily as needed (congestion).     tiZANidine (ZANAFLEX) 4 MG tablet Take 4 mg by mouth at bedtime.     topiramate (TOPAMAX) 25 MG tablet Take 75 mg by mouth at bedtime.  No current facility-administered medications for this visit.    SUMMARY OF ONCOLOGIC HISTORY: Oncology History Overview Note  Endometrioid, MSI High, MLH1 promoter hypermethylation present   Endometrial cancer (HCC)  05/18/2019 Initial Biopsy   EMB: Complex atypical hyperplasia, cannot rule out microscopic focus of FIGO grade 1 carcinoma   05/18/2019 Initial Diagnosis   Presented in February 2021 with  postmenopausal bleeding.  She was started on Provera and underwent endometrial biopsy showing complex atypical hyperplasia with a possible focus suspicious for grade 1 endometrial cancer.   05/21/2019 Imaging   Pelvic ultrasound at [facility]: Anteverted uterus measuring 8.2 cm in greatest dimension.  Endometrial lining measures 1.73 cm and is thickened.  Left ovary not visualized, right ovary normal in appearance.   06/30/2019 Surgery   Robotic-assisted laparoscopic total hysterectomy with bilateral salpingoophorectomy, bilateral pelvic LND  Frozen section showed endometrial cancer with more than 50% myometrial invasion.  Patient did not map to a sentinel lymph node bilaterally.   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  Tumor measures 3.5 cm and invades focally into the serosa.  No involvement of the cervix or bilateral adnexa.  1 of 20 lymph nodes positive for metastatic carcinoma, focus measures 1 cm without evidence of extracapsular extension.  FINAL MICROSCOPIC DIAGNOSIS: A. UTERUS, CERVIX, BILATERAL FALLOPIAN TUBES AND OVARIES, HYSTERECTOMY WITH SALPINGECTOMY: - Endometrioid adenocarcinoma, 3.5 cm, FIGO grade 1 - Carcinoma invades through the entire myometrium and focally into the serosal surface - Benign unremarkable cervix - Benign unremarkable bilateral fallopian tubes and left ovary - Benign serous inclusion cyst of right ovary - See oncology table B. LYMPH NODE, RIGHT PELVIC, BIOPSY: - Metastatic carcinoma to one of twelve lymph nodes (1/12) - Focus of metastatic carcinoma measures 1.0 cm, without evidence of extracapsular extension C. LYMPH NODE, LEFT PELVIC, BIOPSY: - Eight benign lymph nodes (0/8) ONCOLOGY TABLE: UTERUS, CARCINOMA OR CARCINOSARCOMA Procedure: Total hysterectomy and bilateral salpingo-oophorectomy Histologic type: Endometrioid adenocarcinoma Histologic Grade: FIGO grade 1 Myometrial invasion: Depth of  invasion: 30 mm Myometrial thickness: 30 mm Uterine Serosa Involvement: Present, focal Cervical stromal involvement: Not identified Extent of involvement of other organs: Not identified Lymphovascular invasion: Not identified Regional Lymph Nodes: Examined: 0 Sentinel 20 Non-sentinel 20 Total Lymph nodes with metastasis: 1 Isolated tumor cells (<0.2 mm): 0 Micrometastasis: (>0.2 mm and < 2.0 mm): 0 Macrometastasis: (>2.0 mm): 1 Extracapsular extension: Not identified Representative Tumor Block: A6 MMR / MSI testing: Will be ordered Pathologic Stage Classification (pTNM, AJCC 8th edition): pT3, pN1a Comments: None Mismatch Repair Protein (IHC) SUMMARY INTERPRETATION: ABNORMAL 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 The loss of expression may be secondary to promoter hyper-methylation, gene mutation or other genetic event. BRAF mutation testing and/or MLH1 methylation testing is indicated. The presence of a BRAF mutation and/or MLH1 hypermethylation is indicative of a sporadic-type tumor. The absence of either BRAF mutation and/or presence of normal methylation indicate the possible presence of a hereditary germline mutation (e.g.Lynch syndrome) and referral to genetic counseling is warranted. It is recommended that the loss of protein expression be correlated with molecular based MSI testing   06/30/2019 Cancer Staging   Staging form: Corpus Uteri - Carcinoma and Carcinosarcoma, AJCC 8th Edition - Clinical stage from 06/30/2019: FIGO Stage IIIC1 (cT3, cN1a, cM0) - Signed by Carver Fila, MD on 07/09/2019   07/16/2019 Imaging   1. No evidence of metastatic disease in the chest, abdomen or pelvis. 2. Status  post hysterectomy. Small volume simple ascites. Small amount of ill-defined fluid in the pelvic sidewalls, compatible with recent surgery. 3. Small umbilical hernia containing fat and trace ascitic fluid. 4. Chronic findings include: Aortic Atherosclerosis  (ICD10-I70.0). Small hiatal hernia. Mild sigmoid diverticulosis   07/29/2019 Procedure   Ultrasound and fluoroscopically guided right internal jugular single lumen power port catheter insertion. Tip in the SVC/RA junction. Catheter ready for use.   08/03/2019 - 12/02/2019 Chemotherapy   The patient had carboplatin and taxol for chemotherapy treatment.     12/29/2019 Imaging   1. Enlargement of a left external iliac node, borderline sized. This could be reactive or represent early/isolated nodal metastasis. 2. Otherwise, no evidence of metastatic disease in the abdomen or pelvis. 3. Relative hyperattenuation surrounding the gallbladder is favored to be related to hepatic steatosis and sparing. Recommend attention on follow-up. 4. Degraded evaluation of the pelvis, secondary to beam hardening artifact from left hip arthroplasty. 5. Small hiatal hernia. 6. Decrease in trace pelvic fluid. 7. Aortic Atherosclerosis (ICD10-I70.0).   03/30/2020 Imaging   CT A/P: 1. No definite recurrent or metastatic disease in a patient with history of uterine/cervical cancer. Please note limited evaluation of the pelvis due to streak artifact originating from left femoral surgical hardware. 2. Interval development of a nonspecific prominent 0.8 cm retroperitoneal lymph node. Attention on follow-up. 3. Stable prominent bilateral [place] lymph nodes measuring up to 0.9 cm on the left. Attention on follow-up. 4. Small hiatal hernia, likely of the paraesophageal type. 5. Scattered colonic diverticulosis with no acute diverticulitis. 6.  Aortic Atherosclerosis (ICD10-I70.0).   06/13/2020 Imaging   CT A/P: No evidence of recurrent or metastatic disease.   Stable left retroperitoneal and left external iliac lymph node. No new or enlarging lymph nodes.   Hepatic steatosis.   Aortic atherosclerosis.   01/09/2022 Imaging   1. Stable examination demonstrating no findings to suggest locally recurrent or  metastatic disease. 2. Aortic atherosclerosis. 3. Colonic diverticulosis without evidence of acute diverticulitis at this time.  4. Additional incidental findings, as above.     01/24/2022 Procedure   Removal of implanted Port-A-Cath utilizing sharp and blunt dissection. The procedure was uncomplicated.     PHYSICAL EXAMINATION: ECOG PERFORMANCE STATUS: 0 - Asymptomatic  Vitals:   02/08/23 1247  BP: (!) 140/73  Pulse: 60  Resp: 17  Temp: 98 F (36.7 C)  SpO2: 97%   Filed Weights   02/08/23 1247  Weight: 189 lb 14.4 oz (86.1 kg)    GENERAL:alert, no distress and comfortable SKIN: skin color, texture, turgor are normal, no rashes or significant lesions EYES: normal, Conjunctiva are pink and non-injected, sclera clear OROPHARYNX:no exudate, no erythema and lips, buccal mucosa, and tongue normal  NECK: supple, thyroid normal size, non-tender, without nodularity LYMPH:  no palpable lymphadenopathy in the cervical, axillary or inguinal LUNGS: clear to auscultation and percussion with normal breathing effort HEART: regular rate & rhythm and no murmurs and no lower extremity edema ABDOMEN:abdomen soft, non-tender and normal bowel sounds Musculoskeletal:no cyanosis of digits and no clubbing  NEURO: alert & oriented x 3 with fluent speech, no focal motor/sensory deficits  LABORATORY DATA:  I have reviewed the data as listed    Component Value Date/Time   NA 138 12/28/2021 1224   K 3.4 (L) 12/28/2021 1224   CL 104 12/28/2021 1224   CO2 27 12/28/2021 1224   GLUCOSE 109 (H) 12/28/2021 1224   BUN 25 (H) 12/28/2021 1224   CREATININE 2.00 (H) 12/28/2021 1224  CREATININE 1.62 (H) 12/30/2019 1300   CALCIUM 9.7 12/28/2021 1224   PROT 6.5 12/28/2021 1224   ALBUMIN 4.1 12/28/2021 1224   AST 11 (L) 12/28/2021 1224   AST 13 (L) 12/30/2019 1300   ALT 7 12/28/2021 1224   ALT 14 12/30/2019 1300   ALKPHOS 40 12/28/2021 1224   BILITOT 0.5 12/28/2021 1224   BILITOT 0.4 12/30/2019  1300   GFRNONAA 26 (L) 12/28/2021 1224   GFRNONAA 31 (L) 12/30/2019 1300   GFRAA 36 (L) 12/30/2019 1300    No results found for: "SPEP", "UPEP"  Lab Results  Component Value Date   WBC 6.4 02/08/2023   NEUTROABS 4.4 02/08/2023   HGB 12.1 02/08/2023   HCT 36.9 02/08/2023   MCV 91.3 02/08/2023   PLT 263 02/08/2023      Chemistry      Component Value Date/Time   NA 138 12/28/2021 1224   K 3.4 (L) 12/28/2021 1224   CL 104 12/28/2021 1224   CO2 27 12/28/2021 1224   BUN 25 (H) 12/28/2021 1224   CREATININE 2.00 (H) 12/28/2021 1224   CREATININE 1.62 (H) 12/30/2019 1300      Component Value Date/Time   CALCIUM 9.7 12/28/2021 1224   ALKPHOS 40 12/28/2021 1224   AST 11 (L) 12/28/2021 1224   AST 13 (L) 12/30/2019 1300   ALT 7 12/28/2021 1224   ALT 14 12/30/2019 1300   BILITOT 0.5 12/28/2021 1224   BILITOT 0.4 12/30/2019 1300

## 2023-02-14 DIAGNOSIS — N184 Chronic kidney disease, stage 4 (severe): Secondary | ICD-10-CM | POA: Diagnosis not present

## 2023-02-18 DIAGNOSIS — M1A3790 Chronic gout due to renal impairment, unspecified ankle and foot, without tophus (tophi): Secondary | ICD-10-CM | POA: Diagnosis not present

## 2023-02-18 DIAGNOSIS — N1832 Chronic kidney disease, stage 3b: Secondary | ICD-10-CM | POA: Diagnosis not present

## 2023-02-18 DIAGNOSIS — E559 Vitamin D deficiency, unspecified: Secondary | ICD-10-CM | POA: Diagnosis not present

## 2023-02-18 DIAGNOSIS — I129 Hypertensive chronic kidney disease with stage 1 through stage 4 chronic kidney disease, or unspecified chronic kidney disease: Secondary | ICD-10-CM | POA: Diagnosis not present

## 2023-09-24 DIAGNOSIS — S40862A Insect bite (nonvenomous) of left upper arm, initial encounter: Secondary | ICD-10-CM | POA: Diagnosis not present

## 2023-10-21 DIAGNOSIS — G4700 Insomnia, unspecified: Secondary | ICD-10-CM | POA: Diagnosis not present

## 2023-10-21 DIAGNOSIS — F32A Depression, unspecified: Secondary | ICD-10-CM | POA: Diagnosis not present

## 2023-10-21 DIAGNOSIS — G629 Polyneuropathy, unspecified: Secondary | ICD-10-CM | POA: Diagnosis not present

## 2023-10-31 DIAGNOSIS — N183 Chronic kidney disease, stage 3 unspecified: Secondary | ICD-10-CM | POA: Diagnosis not present

## 2023-10-31 DIAGNOSIS — I129 Hypertensive chronic kidney disease with stage 1 through stage 4 chronic kidney disease, or unspecified chronic kidney disease: Secondary | ICD-10-CM | POA: Diagnosis not present

## 2023-10-31 DIAGNOSIS — M1A3790 Chronic gout due to renal impairment, unspecified ankle and foot, without tophus (tophi): Secondary | ICD-10-CM | POA: Diagnosis not present

## 2023-11-05 DIAGNOSIS — M898X9 Other specified disorders of bone, unspecified site: Secondary | ICD-10-CM | POA: Diagnosis not present

## 2023-11-05 DIAGNOSIS — D631 Anemia in chronic kidney disease: Secondary | ICD-10-CM | POA: Diagnosis not present

## 2023-11-05 DIAGNOSIS — N1832 Chronic kidney disease, stage 3b: Secondary | ICD-10-CM | POA: Diagnosis not present

## 2023-11-05 DIAGNOSIS — E559 Vitamin D deficiency, unspecified: Secondary | ICD-10-CM | POA: Diagnosis not present

## 2023-11-05 DIAGNOSIS — I129 Hypertensive chronic kidney disease with stage 1 through stage 4 chronic kidney disease, or unspecified chronic kidney disease: Secondary | ICD-10-CM | POA: Diagnosis not present

## 2024-01-06 ENCOUNTER — Encounter: Payer: Self-pay | Admitting: Radiation Oncology

## 2024-01-06 ENCOUNTER — Ambulatory Visit
Admission: RE | Admit: 2024-01-06 | Discharge: 2024-01-06 | Disposition: A | Discharge status: Home / Self Care | Payer: Self-pay | Source: Ambulatory Visit | Attending: Radiation Oncology | Admitting: Radiation Oncology

## 2024-01-06 VITALS — BP 128/68 | HR 59 | Temp 97.3°F | Resp 18 | Ht 64.0 in | Wt 183.0 lb

## 2024-01-06 DIAGNOSIS — Z923 Personal history of irradiation: Secondary | ICD-10-CM | POA: Insufficient documentation

## 2024-01-06 DIAGNOSIS — Z8542 Personal history of malignant neoplasm of other parts of uterus: Secondary | ICD-10-CM | POA: Diagnosis not present

## 2024-01-06 DIAGNOSIS — Z79899 Other long term (current) drug therapy: Secondary | ICD-10-CM | POA: Insufficient documentation

## 2024-01-06 DIAGNOSIS — C541 Malignant neoplasm of endometrium: Secondary | ICD-10-CM

## 2024-01-06 DIAGNOSIS — K5900 Constipation, unspecified: Secondary | ICD-10-CM | POA: Insufficient documentation

## 2024-01-06 NOTE — Progress Notes (Signed)
 Heidi Avila is here today for follow up post radiation to the pelvic.  They completed their radiation on: 10/14/2019  Does the patient complain of any of the following:  Pain:Denies Abdominal bloating: Denies Diarrhea/Constipation: Constipation Nausea/Vomiting: Denies Vaginal Discharge: She reports a little bit of a discharge Blood in Urine or Stool: Denies Urinary Issues (dysuria/incomplete emptying/ incontinence/ increased frequency/urgency): Denies Does patient report using vaginal dilator 2-3 times a week and/or sexually active 2-3 weeks: Denies Post radiation skin changes: Denies   Additional comments if applicable:None  BP 128/68 (BP Location: Left Arm, Patient Position: Sitting)   Pulse (!) 59   Temp (!) 97.3 F (36.3 C) (Temporal)   Resp 18   Ht 5' 4 (1.626 m)   Wt 183 lb (83 kg)   SpO2 99%   BMI 31.41 kg/m

## 2024-01-06 NOTE — Progress Notes (Signed)
 Radiation Oncology         [phone] ________________________________  Name: Heidi Avila MRN: [id number]  Date: 01/06/2024  DOB: 1945-12-24  Follow-Up Visit Note  CC: Physicians, [facility], MD    ICD-10-CM   1. Endometrial cancer (HCC)  C54.1       Diagnosis: Stage IIIC1 grade 1 endometrioid endometrial adenocarcinoma   Interval Since Last Radiation: 4 years, 2 months, and 23 days   Radiation Treatment Dates: 09/07/2019 through 10/14/2019 Site Technique Total Dose (Gy) Dose per Fx (Gy) Completed Fx Beam Energies  Vagina: Pelvis HDR-brachy 30/30 6 5/5 Ir-192    Narrative:  The patient returns today for routine follow-up. She was last seen here for follow-up on 12/31/22.   In the interval since her last visit, she followed up with Dr. Lonn on 02/08/24. She was noted to be doing well and NED on examination at that time. She did endorse ongoing constipation at that time which is chronic and stable.   She has not followed up with Dr. Viktoria (Gyn-Onc) in the interval since her last visit.         No other significant oncologic interval history since the patient was last seen for follow-up.   On evaluation today she denies any unusual abdominal bloating.  She denies any pelvic pain or vaginal bleeding.  She occasionally will have some mild vaginal discharge.  She denies any hematuria or rectal bleeding.                     Allergies:  is allergic to other, amoxicillin, and latex.  Meds: Current Outpatient Medications  Medication Sig Dispense Refill   Cholecalciferol 50 MCG (2000 UT) CAPS Take 2,000 Units by mouth once. Once a week.     albuterol  (VENTOLIN  HFA) 108 (90 Base) MCG/ACT inhaler SMARTSIG:1 Inhalation Via Inhaler Every 6 Hours PRN     allopurinol (ZYLOPRIM) 100 MG tablet Take 100 mg by mouth daily.     B Complex Vitamins (B COMPLEX PO) Take 1 Dose by mouth daily. Liquid b complex     benazepril -hydrochlorthiazide (LOTENSIN  HCT) 20-25  MG tablet Take 1 tablet by mouth daily.     brimonidine (ALPHAGAN) 0.2 % ophthalmic solution APPLY 1 DROP LEFT EYE TWICE A DAY START 48 HOURS PRIOR TO SURGERY     cyanocobalamin  1000 MCG tablet Take by mouth.     diphenhydrAMINE  (BENADRYL ) 25 MG tablet Take 50 mg by mouth daily as needed for allergies.     escitalopram (LEXAPRO) 10 MG tablet Take 10 mg by mouth daily.     Green Tea, Camellia sinensis, (GREEN TEA EXTRACT PO) Take by mouth.     LINZESS 145 MCG CAPS capsule Take 145 mcg by mouth daily.     Magnesium Hydroxide 1200 MG CHEW Chew by mouth.     NATURAL PSYLLIUM SEED PO Take by mouth.     omeprazole (PRILOSEC OTC) 20 MG tablet Take by mouth.     Oxymetazoline HCl (VICKS SINEX 12 HOUR NA) Place 1 spray into the nose 2 (two) times daily as needed (congestion).     tiZANidine (ZANAFLEX) 4 MG tablet Take 4 mg by mouth at bedtime.     topiramate (TOPAMAX) 25 MG tablet Take 75 mg by mouth at bedtime.     No current facility-administered medications for this encounter.    Physical Findings: The patient is in no acute distress. Patient is alert and oriented.  height is  5' 4 (1.626 m) and weight is 183 lb (83 kg). Her temporal temperature is 97.3 F (36.3 C) (abnormal). Her blood pressure is 128/68 and her pulse is 59 (abnormal). Her respiration is 18 and oxygen  saturation is 99%. .  No significant changes. Lungs are clear to auscultation bilaterally. Heart has regular rate and rhythm. No palpable cervical, supraclavicular, or axillary adenopathy. Abdomen soft, non-tender, normal bowel sounds.  On pelvic examination the external genitalia were unremarkable without lesions or excoriation.  A speculum exam was performed.  There are no mucosal lesions noted in the vaginal vault.  There is some mild erythema noted to the left side of the cuff consistent with radiation effect.  On bimanual and rectovaginal examination no pelvic masses appreciated.  Vaginal cuff intact.  No nodularity to the  cuff.   Lab Findings: Lab Results  Component Value Date   WBC 6.4 02/08/2023   HGB 12.1 02/08/2023   HCT 36.9 02/08/2023   MCV 91.3 02/08/2023   PLT 263 02/08/2023    Radiographic Findings: No results found.  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  Plan: Routine follow-up in 1 year which will likely be her last follow-up.  She will see Dr. Viktoria in 6 months.  After she has completed her 5 years of follow-up, she will be referred back to a gynecologist for yearly exams.   30 minutes of total time was spent for this patient encounter, including preparation, face-to-face counseling with the patient and coordination of care, physical exam, and documentation of the encounter. ____________________________________  Lynwood CHARM Nasuti, PhD, MD  This document serves as a record of services personally performed by Lynwood Nasuti, MD. It was created on his behalf by Dorthy Fuse, a trained medical scribe. The creation of this record is based on the scribe's personal observations and the provider's statements to them. This document has been checked and approved by the attending provider.

## 2024-02-07 ENCOUNTER — Telehealth: Payer: Self-pay

## 2024-02-07 NOTE — Telephone Encounter (Signed)
 Called back and reviewed upcoming appts. She is aware of appts.

## 2024-02-11 ENCOUNTER — Inpatient Hospital Stay: Payer: Medicare Other

## 2024-02-11 ENCOUNTER — Inpatient Hospital Stay: Payer: Medicare Other | Attending: Hematology and Oncology | Admitting: Hematology and Oncology

## 2024-02-11 ENCOUNTER — Encounter: Payer: Self-pay | Admitting: Hematology and Oncology

## 2024-02-11 ENCOUNTER — Telehealth: Payer: Self-pay | Admitting: Oncology

## 2024-02-11 VITALS — BP 122/61 | HR 63 | Temp 98.9°F | Resp 18 | Ht 64.0 in | Wt 186.8 lb

## 2024-02-11 DIAGNOSIS — C541 Malignant neoplasm of endometrium: Secondary | ICD-10-CM | POA: Diagnosis not present

## 2024-02-11 DIAGNOSIS — Z8542 Personal history of malignant neoplasm of other parts of uterus: Secondary | ICD-10-CM | POA: Insufficient documentation

## 2024-02-11 LAB — CBC WITH DIFFERENTIAL/PLATELET
Abs Immature Granulocytes: 0.02 K/uL (ref 0.00–0.07)
Basophils Absolute: 0.1 K/uL (ref 0.0–0.1)
Basophils Relative: 1 %
Eosinophils Absolute: 0.2 K/uL (ref 0.0–0.5)
Eosinophils Relative: 5 %
HCT: 37.2 % (ref 36.0–46.0)
Hemoglobin: 12.5 g/dL (ref 12.0–15.0)
Immature Granulocytes: 0 %
Lymphocytes Relative: 20 %
Lymphs Abs: 1 K/uL (ref 0.7–4.0)
MCH: 30 pg (ref 26.0–34.0)
MCHC: 33.6 g/dL (ref 30.0–36.0)
MCV: 89.2 fL (ref 80.0–100.0)
Monocytes Absolute: 0.6 K/uL (ref 0.1–1.0)
Monocytes Relative: 12 %
Neutro Abs: 3.1 K/uL (ref 1.7–7.7)
Neutrophils Relative %: 62 %
Platelets: 260 K/uL (ref 150–400)
RBC: 4.17 MIL/uL (ref 3.87–5.11)
RDW: 13.2 % (ref 11.5–15.5)
WBC: 5.1 K/uL (ref 4.0–10.5)
nRBC: 0 % (ref 0.0–0.2)

## 2024-02-11 LAB — COMPREHENSIVE METABOLIC PANEL WITH GFR
ALT: 7 U/L (ref 0–44)
AST: 11 U/L — ABNORMAL LOW (ref 15–41)
Albumin: 4.1 g/dL (ref 3.5–5.0)
Alkaline Phosphatase: 34 U/L — ABNORMAL LOW (ref 38–126)
Anion gap: 8 (ref 5–15)
BUN: 25 mg/dL — ABNORMAL HIGH (ref 8–23)
CO2: 28 mmol/L (ref 22–32)
Calcium: 9.8 mg/dL (ref 8.9–10.3)
Chloride: 105 mmol/L (ref 98–111)
Creatinine, Ser: 2 mg/dL — ABNORMAL HIGH (ref 0.44–1.00)
GFR, Estimated: 25 mL/min — ABNORMAL LOW (ref >60)
Glucose, Bld: 63 mg/dL — ABNORMAL LOW (ref 70–99)
Potassium: 3.2 mmol/L — ABNORMAL LOW (ref 3.5–5.1)
Sodium: 141 mmol/L (ref 135–145)
Total Bilirubin: 0.4 mg/dL (ref 0.0–1.2)
Total Protein: 6.8 g/dL (ref 6.5–8.1)

## 2024-02-11 NOTE — Progress Notes (Signed)
 [facility] OFFICE PROGRESS NOTE  Patient Care Team: Physicians, [facility] as PCP - General (Family Medicine) Shannon Agent, MD as Consulting Physician (Radiation Oncology)  Assessment & Plan Endometrial cancer [facility]) The patient was diagnosed with endometrial cancer after presentation with postmenopausal bleeding in 2021 She underwent surgery followed by adjuvant chemotherapy and radiation treatment, adjuvant treatment completed by August 2021 Final pathology: Stage IIIc grade 1 endometrioid adenocarcinoma, MSI high  She 4 years out of her adjuvant treatment She has no clinical signs and symptoms to suggest cancer recurrence I will see her again next year for further follow-up  No orders of the defined types were placed in this encounter.    Almarie Bedford, MD  INTERVAL HISTORY: she returns for surveillance follow-up for history of endometrial cancer She is doing well No recent vaginal bleeding or bloating She has chronic constipation, stable and she takes fiber supplement  PHYSICAL EXAMINATION: ECOG PERFORMANCE STATUS: 0 - Asymptomatic  Vitals:   02/11/24 1210  BP: 122/61  Pulse: 63  Resp: 18  Temp: 98.9 F (37.2 C)  SpO2: 98%   Filed Weights   02/11/24 1210  Weight: 186 lb 12.8 oz (84.7 kg)   GENERAL:alert, no distress and comfortable SKIN: skin color, texture, turgor are normal, no rashes or significant lesions EYES: normal, conjunctiva are pink and non-injected, sclera clear OROPHARYNX:no exudate, no erythema and lips, buccal mucosa, and tongue normal  NECK: supple, thyroid  normal size, non-tender, without nodularity LYMPH:  no palpable lymphadenopathy in the cervical, axillary or inguinal LUNGS: clear to auscultation and percussion with normal breathing effort HEART: regular rate & rhythm and no murmurs and no lower extremity edema ABDOMEN:abdomen soft, non-tender and normal bowel sounds Musculoskeletal:no cyanosis of digits and no clubbing   PSYCH: alert & oriented x 3 with fluent speech NEURO: no focal motor/sensory deficits  Relevant data reviewed during this visit included CBC and CMP

## 2024-02-11 NOTE — Assessment & Plan Note (Addendum)
 The patient was diagnosed with endometrial cancer after presentation with postmenopausal bleeding in 2021 She underwent surgery followed by adjuvant chemotherapy and radiation treatment, adjuvant treatment completed by August 2021 Final pathology: Stage IIIc grade 1 endometrioid adenocarcinoma, MSI high  She 4 years out of her adjuvant treatment She has no clinical signs and symptoms to suggest cancer recurrence I will see her again next year for further follow-up

## 2024-02-11 NOTE — Telephone Encounter (Signed)
 [facility] and notified her of appointment with Dr. Viktoria on 07/03/2024 at 2:30.

## 2024-02-27 DIAGNOSIS — W19XXXA Unspecified fall, initial encounter: Secondary | ICD-10-CM | POA: Diagnosis not present

## 2024-02-27 DIAGNOSIS — R0789 Other chest pain: Secondary | ICD-10-CM | POA: Diagnosis not present

## 2024-02-27 DIAGNOSIS — S2341XA Sprain of ribs, initial encounter: Secondary | ICD-10-CM | POA: Diagnosis not present

## 2024-03-09 ENCOUNTER — Other Ambulatory Visit: Payer: Self-pay

## 2024-03-09 ENCOUNTER — Emergency Department (HOSPITAL_COMMUNITY)

## 2024-03-09 ENCOUNTER — Emergency Department (HOSPITAL_COMMUNITY)
Admission: EM | Admit: 2024-03-09 | Discharge: 2024-03-09 | Disposition: A | Discharge status: Home / Self Care | Source: Ambulatory Visit | Attending: Emergency Medicine | Admitting: Emergency Medicine

## 2024-03-09 DIAGNOSIS — Z91040 Latex allergy status: Secondary | ICD-10-CM | POA: Diagnosis not present

## 2024-03-09 DIAGNOSIS — S2242XA Multiple fractures of ribs, left side, initial encounter for closed fracture: Secondary | ICD-10-CM | POA: Diagnosis not present

## 2024-03-09 DIAGNOSIS — R0602 Shortness of breath: Secondary | ICD-10-CM | POA: Diagnosis not present

## 2024-03-09 DIAGNOSIS — W1839XA Other fall on same level, initial encounter: Secondary | ICD-10-CM | POA: Insufficient documentation

## 2024-03-09 DIAGNOSIS — R059 Cough, unspecified: Secondary | ICD-10-CM | POA: Diagnosis not present

## 2024-03-09 DIAGNOSIS — S29001A Unspecified injury of muscle and tendon of front wall of thorax, initial encounter: Secondary | ICD-10-CM | POA: Diagnosis present

## 2024-03-09 DIAGNOSIS — R0981 Nasal congestion: Secondary | ICD-10-CM | POA: Diagnosis not present

## 2024-03-09 DIAGNOSIS — R0789 Other chest pain: Secondary | ICD-10-CM | POA: Diagnosis not present

## 2024-03-09 LAB — CBC WITH DIFFERENTIAL/PLATELET
Abs Immature Granulocytes: 0.04 K/uL (ref 0.00–0.07)
Basophils Absolute: 0.1 K/uL (ref 0.0–0.1)
Basophils Relative: 1 %
Eosinophils Absolute: 0.2 K/uL (ref 0.0–0.5)
Eosinophils Relative: 3 %
HCT: 34.5 % — ABNORMAL LOW (ref 36.0–46.0)
Hemoglobin: 10.8 g/dL — ABNORMAL LOW (ref 12.0–15.0)
Immature Granulocytes: 1 %
Lymphocytes Relative: 14 %
Lymphs Abs: 1 K/uL (ref 0.7–4.0)
MCH: 29.9 pg (ref 26.0–34.0)
MCHC: 31.3 g/dL (ref 30.0–36.0)
MCV: 95.6 fL (ref 80.0–100.0)
Monocytes Absolute: 0.8 K/uL (ref 0.1–1.0)
Monocytes Relative: 11 %
Neutro Abs: 4.8 K/uL (ref 1.7–7.7)
Neutrophils Relative %: 70 %
Platelets: 257 K/uL (ref 150–400)
RBC: 3.61 MIL/uL — ABNORMAL LOW (ref 3.87–5.11)
RDW: 13.6 % (ref 11.5–15.5)
WBC: 6.8 K/uL (ref 4.0–10.5)
nRBC: 0 % (ref 0.0–0.2)

## 2024-03-09 LAB — BASIC METABOLIC PANEL WITH GFR
Anion gap: 11 (ref 5–15)
BUN: 29 mg/dL — ABNORMAL HIGH (ref 8–23)
CO2: 25 mmol/L (ref 22–32)
Calcium: 9.4 mg/dL (ref 8.9–10.3)
Chloride: 103 mmol/L (ref 98–111)
Creatinine, Ser: 1.67 mg/dL — ABNORMAL HIGH (ref 0.44–1.00)
GFR, Estimated: 31 mL/min — ABNORMAL LOW (ref >60)
Glucose, Bld: 116 mg/dL — ABNORMAL HIGH (ref 70–99)
Potassium: 3.7 mmol/L (ref 3.5–5.1)
Sodium: 140 mmol/L (ref 135–145)

## 2024-03-09 MED ORDER — LIDOCAINE 5 % EX PTCH
1.0000 | MEDICATED_PATCH | CUTANEOUS | Status: DC
  Administered 2024-03-09: 1 via TRANSDERMAL
  Filled 2024-03-09: qty 1

## 2024-03-09 MED ORDER — CYCLOBENZAPRINE HCL 10 MG PO TABS: 10.0000 mg | ORAL_TABLET | Freq: Two times a day (BID) | ORAL | 0 refills | Status: AC | PRN

## 2024-03-09 MED ORDER — OXYCODONE HCL 5 MG PO TABS: 5.0000 mg | ORAL_TABLET | ORAL | 0 refills | Status: AC | PRN

## 2024-03-09 MED ORDER — DOXYCYCLINE HYCLATE 100 MG PO CAPS: 100.0000 mg | ORAL_CAPSULE | Freq: Two times a day (BID) | ORAL | 0 refills | Status: AC

## 2024-03-09 MED ORDER — LIDOCAINE 5 % EX PTCH: 1.0000 | MEDICATED_PATCH | CUTANEOUS | 0 refills | Status: AC

## 2024-03-09 MED ORDER — ACETAMINOPHEN 500 MG PO TABS
1000.0000 mg | ORAL_TABLET | Freq: Once | ORAL | Status: AC
  Administered 2024-03-09: 1000 mg via ORAL
  Filled 2024-03-09: qty 2

## 2024-03-09 NOTE — ED Notes (Signed)
 Pt to bathroom on her own with cane assist

## 2024-03-09 NOTE — ED Notes (Signed)
 Dr. Massie that patient does not want to take oxycodone 

## 2024-03-09 NOTE — ED Triage Notes (Signed)
 Pt A&O arrived POV. Pt reports falling 2 weeks ago & not feeling well after the fall. Pt reports difficulty breathing, pain when inhaling & difficulty sleeping. Pt reports going to [facility] urgent Care this morning where she was told she had a rib Fx.

## 2024-03-09 NOTE — ED Provider Notes (Addendum)
 [place] [facility] AT [facility] Provider Note   CSN: [id number] Arrival date & time: 03/09/24  1156     Patient presents with: Rib Injury and Sore Throat   Heidi Avila is a 78 y.o. female.   Patient here with ongoing left-sided rib pain after a fall several days ago.  Went to urgent care was told she has a rib fracture.  She has been prescribed muscle relaxant without much relief.  Subjective fevers at home.  Sore throat.  Had a negative COVID and flu test at urgent care today.  She has been using muscle relaxant without much relief.  She drove here by herself.  She would like to avoid any sedating meds so she can go back home today.  She needs something more for her pain relief however.  They thought may be pneumonia on her chest x-ray today as well.  She denies any weakness numbness tingling.  Denies any headache or neck pain.  This was a mechanical fall that happened several days ago.  The history is provided by the patient.       Prior to Admission medications   Medication Sig Start Date End Date Taking? Authorizing Provider  cyclobenzaprine  (FLEXERIL ) 10 MG tablet Take 1 tablet (10 mg total) by mouth 2 (two) times daily as needed for muscle spasms. 03/09/24  Yes Jaquese Irving, DO  doxycycline  (VIBRAMYCIN ) 100 MG capsule Take 1 capsule (100 mg total) by mouth 2 (two) times daily. 03/09/24  Yes Keiarah Orlowski, DO  lidocaine  (LIDODERM ) 5 % Place 1 patch onto the skin daily. Remove & Discard patch within 12 hours or as directed by MD 03/09/24  Yes Kista Robb, DO  oxyCODONE  (ROXICODONE ) 5 MG immediate release tablet Take 1 tablet (5 mg total) by mouth every 4 (four) hours as needed for up to 20 doses for breakthrough pain. 03/09/24  Yes Kamara Allan, DO  albuterol  (VENTOLIN  HFA) 108 (90 Base) MCG/ACT inhaler SMARTSIG:1 Inhalation Via Inhaler Every 6 Hours PRN 03/22/22   [provider]  allopurinol (ZYLOPRIM) 100 MG tablet Take 100 mg by mouth  daily. 08/13/21   [provider]  B Complex Vitamins (B COMPLEX PO) Take 1 Dose by mouth daily. Liquid b complex    [provider]  benazepril -hydrochlorthiazide (LOTENSIN  HCT) 10-12.5 MG tablet Take 1 tablet by mouth daily.    [provider]  Cholecalciferol 50 MCG (2000 UT) CAPS Take 2,000 Units by mouth once. Once a week. 11/05/23   [provider]  cyanocobalamin  1000 MCG tablet Take by mouth.    [provider]  diphenhydrAMINE  (BENADRYL ) 25 MG tablet Take 50 mg by mouth daily as needed for allergies.    [provider]  escitalopram (LEXAPRO) 10 MG tablet Take 10 mg by mouth daily. 08/11/21   [provider]  NATURAL PSYLLIUM SEED PO Take by mouth.    [provider]  omeprazole (PRILOSEC OTC) 20 MG tablet Take by mouth.    [provider]  Oxymetazoline HCl (VICKS SINEX 12 HOUR NA) Place 1 spray into the nose 2 (two) times daily as needed (congestion).    [provider]  tiZANidine (ZANAFLEX) 4 MG tablet Take 4 mg by mouth at bedtime. 07/24/21   [provider]  topiramate (TOPAMAX) 25 MG tablet Take 75 mg by mouth at bedtime. 05/06/19   [provider]    Allergies: Other, Amoxicillin, and Latex    Review of Systems  Updated Vital Signs  BP (!) 126/112 (BP Location: Left Arm)   Pulse 77   Temp 97.8 F (36.6 C) (Oral)   Resp 18   Ht 5' 4 (1.626 m)   Wt 83.9 kg   SpO2 100%   BMI 31.76 kg/m   Physical Exam Vitals and nursing note reviewed.  Constitutional:      General: She is not in acute distress.    Appearance: She is well-developed.  HENT:     Head: Normocephalic and atraumatic.     Right Ear: Tympanic membrane normal.     Left Ear: Tympanic membrane normal.     Nose: No congestion.     Mouth/Throat:     Mouth: Mucous membranes are moist.     Pharynx: No oropharyngeal exudate or posterior oropharyngeal erythema.     Tonsils: Tonsillar exudate present. No  tonsillar abscesses.  Eyes:     Conjunctiva/sclera: Conjunctivae normal.  Cardiovascular:     Rate and Rhythm: Normal rate and regular rhythm.     Heart sounds: No murmur heard. Pulmonary:     Effort: Pulmonary effort is normal. No respiratory distress.     Breath sounds: Normal breath sounds.  Abdominal:     Palpations: Abdomen is soft.     Tenderness: There is no abdominal tenderness.  Musculoskeletal:        General: No swelling.     Cervical back: Neck supple.     Comments: Tenderness over the left posterior and anterior ribs  Skin:    General: Skin is warm and dry.     Capillary Refill: Capillary refill takes less than 2 seconds.  Neurological:     General: No focal deficit present.     Mental Status: She is alert.  Psychiatric:        Mood and Affect: Mood normal.     (all labs ordered are listed, but only abnormal results are displayed) Labs Reviewed  CBC WITH DIFFERENTIAL/PLATELET - Abnormal; Notable for the following components:      Result Value   RBC 3.61 (*)    Hemoglobin 10.8 (*)    HCT 34.5 (*)    All other components within normal limits  BASIC METABOLIC PANEL WITH GFR - Abnormal; Notable for the following components:   Glucose, Bld 116 (*)    BUN 29 (*)    Creatinine, Ser 1.67 (*)    GFR, Estimated 31 (*)    All other components within normal limits    EKG: None  Radiology: CT Chest Wo Contrast Result Date: 03/09/2024 CLINICAL DATA:  Pneumonia complication suspected. EXAM: CT CHEST WITHOUT CONTRAST TECHNIQUE: Multidetector CT imaging of the chest was performed following the standard protocol without IV contrast. RADIATION DOSE REDUCTION: This exam was performed according to the departmental dose-optimization program which includes automated exposure control, adjustment of the mA and/or kV according to patient size and/or use of iterative reconstruction technique. COMPARISON:  CT dated 07/16/2019. FINDINGS: Evaluation of this exam is limited in the  absence of intravenous contrast. Cardiovascular: There is no cardiomegaly or pericardial effusion. There is coronary vascular calcification. Mild atherosclerotic calcification of the thoracic aorta. No aneurysmal dilatation. The central pulmonary arteries are grossly unremarkable. Mediastinum/Nodes: No hilar or mediastinal adenopathy. The esophagus is grossly unremarkable. No mediastinal fluid collection. Lungs/Pleura: Small left pleural effusion. There is partial consolidative changes of the left lower lobe which may represent atelectasis or pneumonia. Right middle lobe linear atelectasis/scarring. There is no pneumothorax. The central airways are patent. Upper Abdomen: No acute abnormality.  Musculoskeletal: Displaced fractures of the posterior left 8th-10th ribs. Displaced fractures of the lateral left 6th-8th ribs. IMPRESSION: 1. Displaced left rib fractures.  No pneumothorax. 2. Small left pleural effusion with partial consolidative changes of the left lower lobe which may represent atelectasis or pneumonia. 3.  Aortic Atherosclerosis (ICD10-I70.0). Electronically Signed   By: Vanetta Chou M.D.   On: 03/09/2024 13:32     Procedures   Medications Ordered in the ED  lidocaine  (LIDODERM ) 5 % 1 patch (1 patch Transdermal Patch Applied 03/09/24 1313)  acetaminophen  (TYLENOL ) tablet 1,000 mg (1,000 mg Oral Given 03/09/24 1310)                                    Medical Decision Making Amount and/or Complexity of Data Reviewed Labs: ordered. Radiology: ordered.  Risk OTC drugs. Prescription drug management.   [facility] is here with left sided rib pain and cough after recent fall recently.  She was diagnosed with a rib fracture today at urgent care sent for further evaluation.  She denies any weakness numbness tingling.  She got normal vitals.  No fever.  She has been taking muscle relaxant that was prescribed when the fall recently happened without much relief.  She is having a hard  time taking deep breaths.  Having a hard time sleeping.  Looks like my review of x-ray report from urgent care she has 1/9 rib fracture and may be pneumonia/atelectasis.  Ultimately will get a CT scan of her chest to further evaluate for rib fractures pneumonia versus atelectasis.  She got reassuring vitals here.  Will give her lidocaine  patch Tylenol .  She wants to avoid any sedating meds because she like to drive home by herself if she can.  She is complaining of sore throat as well.  Had a negative flu and COVID test at urgent care.  She had strep test done as well and was negative.    I have no concern for ACS PE at this time.  I do think that this is likely a rib contusion/fracture maybe complicated by pneumonia now but she has reassuring vitals.  Ultimately lab work shows no significant leukocytosis anemia or electrolyte abnormality.  CT scan shows displaced left rib fractures 8 through 10 posteriorly and 6-8 anteriorly.  Suspected atelectasis versus pneumonia also seen on CT.  Clinically she appears well.  She is feeling much better after lidocaine  patch and Tylenol  actually.  I did talk with her about possible admission for pain control but she prefers outpatient treatment with I think is reasonable.  She is not septic.  I think she would benefit from oxycodone , Flexeril  lidocaine  patches Tylenol  and will conservatively start her on antibiotics.  Told her to return if she develops fever sputum production worsening symptoms as may be pneumonia is more obvious/causing more systemic symptoms.  She understands return precautions.  Discharged in good condition.  This chart was dictated using voice recognition software.  Despite best efforts to proofread,  errors can occur which can change the documentation meaning.      Final diagnoses:  Closed fracture of multiple ribs of left side, initial encounter    ED Discharge Orders          Ordered    oxyCODONE  (ROXICODONE ) 5 MG immediate release tablet   Every 4 hours PRN        03/09/24 1355    cyclobenzaprine  (FLEXERIL ) 10  MG tablet  2 times daily PRN        03/09/24 1355    lidocaine  (LIDODERM ) 5 %  Every 24 hours        03/09/24 1355    doxycycline  (VIBRAMYCIN ) 100 MG capsule  2 times daily        03/09/24 1355               Ruthe Cornet, DO 03/09/24 1358    Ruthe Cornet, DO 03/09/24 1413

## 2024-03-09 NOTE — Discharge Instructions (Addendum)
 Recommend 1000 mg of Tylenol  every 6 hours as needed for pain.  Take Flexeril , muscle relaxant as needed.  Take Roxicodone  which is a narcotic pain medicine for breakthrough pain as prescribed.  These medications, cyclobenzaprine /Flexeril  and Roxicodone  are sedating so please be careful with its use.  Do not mix with alcohol  or drugs or dangerous activities including driving.  Recommend lidocaine  patches as prescribed to buy them over-the-counter.  Follow-up closely with your primary care doctor.  Conservatively I started you on antibiotic called doxycycline  to treat for may be pneumonia or developing pneumonia.  Please return if you develop fever, worsening respiratory symptoms as we discussed.  Follow-up close with your primary care doctor.

## 2024-03-09 NOTE — ED Notes (Signed)
 Attempted IV x2, unable to thread.  Labs obtained and sent.  Pain meds given.  Pt teary missing her son who passed away.  Pt comforted

## 2024-03-13 ENCOUNTER — Encounter (HOSPITAL_COMMUNITY): Payer: Self-pay | Admitting: Pharmacy Technician

## 2024-03-13 NOTE — Telephone Encounter (Signed)
 Error

## 2024-07-03 ENCOUNTER — Inpatient Hospital Stay: Admitting: Gynecologic Oncology

## 2025-01-04 ENCOUNTER — Ambulatory Visit: Admitting: Radiation Oncology

## 2025-02-11 ENCOUNTER — Inpatient Hospital Stay: Admitting: Hematology and Oncology

## 2025-02-11 ENCOUNTER — Inpatient Hospital Stay
